# Patient Record
Sex: Female | Born: 1945 | Race: White | Hispanic: No | Smoking: Never smoker
Health system: Southern US, Community
[De-identification: ages and names within clinical notes are randomized; demographics above are authoritative.]

## PROBLEM LIST (undated history)

## (undated) DIAGNOSIS — N390 Urinary tract infection, site not specified: Secondary | ICD-10-CM

## (undated) DIAGNOSIS — M545 Low back pain, unspecified: Secondary | ICD-10-CM

## (undated) DIAGNOSIS — I10 Essential (primary) hypertension: Secondary | ICD-10-CM

## (undated) DIAGNOSIS — J329 Chronic sinusitis, unspecified: Secondary | ICD-10-CM

## (undated) DIAGNOSIS — M8580 Other specified disorders of bone density and structure, unspecified site: Secondary | ICD-10-CM

## (undated) HISTORY — DX: Chronic sinusitis, unspecified: J32.9

## (undated) HISTORY — DX: Low back pain: M54.5

## (undated) HISTORY — DX: Essential (primary) hypertension: I10

## (undated) HISTORY — PX: COLONOSCOPY: SHX174

## (undated) HISTORY — DX: Urinary tract infection, site not specified: N39.0

## (undated) HISTORY — PX: KNEE ARTHROSCOPY: SUR90

## (undated) HISTORY — PX: BUNIONECTOMY: SHX129

## (undated) HISTORY — DX: Low back pain, unspecified: M54.50

## (undated) HISTORY — DX: Other specified disorders of bone density and structure, unspecified site: M85.80

## (undated) HISTORY — PX: ROTATOR CUFF REPAIR: SHX139

---

## 2002-10-12 ENCOUNTER — Other Ambulatory Visit: Admission: RE | Admit: 2002-10-12 | Discharge: 2002-10-12 | Payer: Self-pay | Admitting: *Deleted

## 2002-12-15 ENCOUNTER — Ambulatory Visit (HOSPITAL_COMMUNITY): Admission: RE | Admit: 2002-12-15 | Discharge: 2002-12-15 | Payer: Self-pay | Admitting: Internal Medicine

## 2002-12-20 ENCOUNTER — Ambulatory Visit (HOSPITAL_COMMUNITY): Admission: RE | Admit: 2002-12-20 | Discharge: 2002-12-20 | Payer: Self-pay | Admitting: *Deleted

## 2003-10-12 ENCOUNTER — Other Ambulatory Visit: Admission: RE | Admit: 2003-10-12 | Discharge: 2003-10-12 | Payer: Self-pay | Admitting: *Deleted

## 2003-11-21 ENCOUNTER — Ambulatory Visit: Payer: Self-pay | Admitting: Internal Medicine

## 2003-12-13 ENCOUNTER — Emergency Department (HOSPITAL_COMMUNITY): Admission: EM | Admit: 2003-12-13 | Discharge: 2003-12-14 | Payer: Self-pay | Admitting: Emergency Medicine

## 2003-12-14 ENCOUNTER — Ambulatory Visit (HOSPITAL_COMMUNITY): Admission: RE | Admit: 2003-12-14 | Discharge: 2003-12-14 | Payer: Self-pay | Admitting: Internal Medicine

## 2003-12-18 ENCOUNTER — Ambulatory Visit: Payer: Self-pay | Admitting: Internal Medicine

## 2003-12-20 ENCOUNTER — Ambulatory Visit: Payer: Self-pay | Admitting: Internal Medicine

## 2004-01-01 ENCOUNTER — Ambulatory Visit (HOSPITAL_COMMUNITY): Admission: RE | Admit: 2004-01-01 | Discharge: 2004-01-01 | Payer: Self-pay | Admitting: *Deleted

## 2004-01-03 ENCOUNTER — Ambulatory Visit (HOSPITAL_COMMUNITY): Admission: RE | Admit: 2004-01-03 | Discharge: 2004-01-03 | Payer: Self-pay | Admitting: Internal Medicine

## 2004-01-23 ENCOUNTER — Ambulatory Visit: Payer: Self-pay | Admitting: Critical Care Medicine

## 2004-02-12 ENCOUNTER — Ambulatory Visit: Payer: Self-pay | Admitting: Critical Care Medicine

## 2004-04-15 ENCOUNTER — Ambulatory Visit: Payer: Self-pay | Admitting: Critical Care Medicine

## 2004-08-20 ENCOUNTER — Ambulatory Visit: Payer: Self-pay | Admitting: Critical Care Medicine

## 2004-11-12 ENCOUNTER — Ambulatory Visit: Payer: Self-pay | Admitting: Critical Care Medicine

## 2004-12-16 ENCOUNTER — Ambulatory Visit: Payer: Self-pay | Admitting: Pulmonary Disease

## 2005-04-17 ENCOUNTER — Ambulatory Visit: Payer: Self-pay | Admitting: Critical Care Medicine

## 2005-10-21 ENCOUNTER — Ambulatory Visit: Payer: Self-pay | Admitting: Internal Medicine

## 2005-10-23 ENCOUNTER — Ambulatory Visit: Payer: Self-pay | Admitting: Critical Care Medicine

## 2005-11-21 ENCOUNTER — Ambulatory Visit: Payer: Self-pay | Admitting: Internal Medicine

## 2006-02-09 ENCOUNTER — Ambulatory Visit: Payer: Self-pay | Admitting: Internal Medicine

## 2006-02-09 LAB — CONVERTED CEMR LAB
ALT: 15 units/L (ref 0–40)
AST: 19 units/L (ref 0–37)
Albumin: 3.9 g/dL (ref 3.5–5.2)
Alkaline Phosphatase: 49 units/L (ref 39–117)
BUN: 16 mg/dL (ref 6–23)
Basophils Absolute: 0.1 10*3/uL (ref 0.0–0.1)
Basophils Relative: 1.2 % — ABNORMAL HIGH (ref 0.0–1.0)
Bilirubin Urine: NEGATIVE
CO2: 30 meq/L (ref 19–32)
Calcium: 9.1 mg/dL (ref 8.4–10.5)
Chloride: 107 meq/L (ref 96–112)
Cholesterol: 219 mg/dL (ref 0–200)
Creatinine, Ser: 0.9 mg/dL (ref 0.4–1.2)
Direct LDL: 123.6 mg/dL
Eosinophils Absolute: 0.2 10*3/uL (ref 0.0–0.6)
Eosinophils Relative: 4.9 % (ref 0.0–5.0)
GFR calc Af Amer: 83 mL/min
GFR calc non Af Amer: 68 mL/min
Glucose, Bld: 101 mg/dL — ABNORMAL HIGH (ref 70–99)
HCT: 37.8 % (ref 36.0–46.0)
HDL: 68.2 mg/dL (ref >39.0)
Hemoglobin, Urine: NEGATIVE
Hemoglobin: 12.8 g/dL (ref 12.0–15.0)
Ketones, ur: NEGATIVE mg/dL
Leukocytes, UA: NEGATIVE
Lymphocytes Relative: 49.7 % — ABNORMAL HIGH (ref 12.0–46.0)
MCHC: 34 g/dL (ref 30.0–36.0)
MCV: 92.6 fL (ref 78.0–100.0)
Monocytes Absolute: 0.4 10*3/uL (ref 0.2–0.7)
Monocytes Relative: 9.5 % (ref 3.0–11.0)
Neutro Abs: 1.6 10*3/uL (ref 1.4–7.7)
Neutrophils Relative %: 34.7 % — ABNORMAL LOW (ref 43.0–77.0)
Nitrite: NEGATIVE
Platelets: 239 10*3/uL (ref 150–400)
Potassium: 4.1 meq/L (ref 3.5–5.1)
RBC: 4.08 M/uL (ref 3.87–5.11)
RDW: 12.3 % (ref 11.5–14.6)
Sodium: 142 meq/L (ref 135–145)
Specific Gravity, Urine: 1.015 (ref 1.000–1.03)
TSH: 2.33 microintl units/mL (ref 0.35–5.50)
Total Bilirubin: 0.6 mg/dL (ref 0.3–1.2)
Total CHOL/HDL Ratio: 3.2
Total Protein, Urine: NEGATIVE mg/dL
Total Protein: 6.8 g/dL (ref 6.0–8.3)
Triglycerides: 104 mg/dL (ref 0–149)
Urine Glucose: NEGATIVE mg/dL
Urobilinogen, UA: 0.2 (ref 0.0–1.0)
VLDL: 21 mg/dL (ref 0–40)
Vit D, 1,25-Dihydroxy: 32 (ref 20–57)
WBC: 4.6 10*3/uL (ref 4.5–10.5)
pH: 7.5 (ref 5.0–8.0)

## 2006-02-17 ENCOUNTER — Ambulatory Visit: Payer: Self-pay | Admitting: Internal Medicine

## 2006-02-25 DIAGNOSIS — N951 Menopausal and female climacteric states: Secondary | ICD-10-CM | POA: Insufficient documentation

## 2006-03-10 ENCOUNTER — Ambulatory Visit: Payer: Self-pay | Admitting: Critical Care Medicine

## 2006-04-02 DIAGNOSIS — N952 Postmenopausal atrophic vaginitis: Secondary | ICD-10-CM | POA: Insufficient documentation

## 2006-05-12 ENCOUNTER — Ambulatory Visit: Payer: Self-pay | Admitting: Pulmonary Disease

## 2006-09-22 ENCOUNTER — Ambulatory Visit: Payer: Self-pay | Admitting: Critical Care Medicine

## 2006-10-06 ENCOUNTER — Ambulatory Visit: Payer: Self-pay | Admitting: Critical Care Medicine

## 2006-12-15 ENCOUNTER — Ambulatory Visit: Payer: Self-pay | Admitting: Critical Care Medicine

## 2006-12-15 ENCOUNTER — Encounter: Payer: Self-pay | Admitting: Critical Care Medicine

## 2006-12-15 DIAGNOSIS — M899 Disorder of bone, unspecified: Secondary | ICD-10-CM

## 2006-12-15 DIAGNOSIS — M949 Disorder of cartilage, unspecified: Secondary | ICD-10-CM

## 2006-12-15 DIAGNOSIS — J309 Allergic rhinitis, unspecified: Secondary | ICD-10-CM

## 2006-12-15 DIAGNOSIS — M8580 Other specified disorders of bone density and structure, unspecified site: Secondary | ICD-10-CM | POA: Insufficient documentation

## 2006-12-15 DIAGNOSIS — J45909 Unspecified asthma, uncomplicated: Secondary | ICD-10-CM | POA: Insufficient documentation

## 2006-12-15 DIAGNOSIS — J329 Chronic sinusitis, unspecified: Secondary | ICD-10-CM | POA: Insufficient documentation

## 2006-12-15 HISTORY — DX: Chronic sinusitis, unspecified: J32.9

## 2006-12-15 HISTORY — DX: Disorder of bone, unspecified: M89.9

## 2006-12-15 HISTORY — DX: Unspecified asthma, uncomplicated: J45.909

## 2006-12-15 HISTORY — DX: Allergic rhinitis, unspecified: J30.9

## 2007-05-06 ENCOUNTER — Encounter: Payer: Self-pay | Admitting: Internal Medicine

## 2007-05-13 ENCOUNTER — Encounter: Payer: Self-pay | Admitting: Internal Medicine

## 2007-06-16 ENCOUNTER — Ambulatory Visit: Payer: Self-pay | Admitting: Critical Care Medicine

## 2007-06-23 ENCOUNTER — Telehealth (INDEPENDENT_AMBULATORY_CARE_PROVIDER_SITE_OTHER): Payer: Self-pay | Admitting: *Deleted

## 2007-06-28 ENCOUNTER — Encounter: Payer: Self-pay | Admitting: Internal Medicine

## 2007-08-16 ENCOUNTER — Encounter: Payer: Self-pay | Admitting: Internal Medicine

## 2007-08-30 ENCOUNTER — Emergency Department: Payer: Self-pay | Admitting: Emergency Medicine

## 2007-08-30 ENCOUNTER — Other Ambulatory Visit: Payer: Self-pay

## 2007-09-22 ENCOUNTER — Ambulatory Visit: Payer: Self-pay | Admitting: Critical Care Medicine

## 2007-09-22 ENCOUNTER — Telehealth: Payer: Self-pay | Admitting: Critical Care Medicine

## 2007-09-22 DIAGNOSIS — R071 Chest pain on breathing: Secondary | ICD-10-CM

## 2007-09-22 HISTORY — DX: Chest pain on breathing: R07.1

## 2007-09-29 ENCOUNTER — Encounter: Payer: Self-pay | Admitting: Critical Care Medicine

## 2007-10-14 ENCOUNTER — Ambulatory Visit: Payer: Self-pay | Admitting: Critical Care Medicine

## 2007-10-20 ENCOUNTER — Encounter: Payer: Self-pay | Admitting: Internal Medicine

## 2007-11-10 ENCOUNTER — Encounter: Payer: Self-pay | Admitting: Internal Medicine

## 2007-12-14 ENCOUNTER — Ambulatory Visit: Payer: Self-pay | Admitting: Critical Care Medicine

## 2007-12-16 ENCOUNTER — Telehealth: Payer: Self-pay | Admitting: Internal Medicine

## 2008-02-24 ENCOUNTER — Ambulatory Visit: Payer: Self-pay | Admitting: Internal Medicine

## 2008-02-24 ENCOUNTER — Encounter: Payer: Self-pay | Admitting: Internal Medicine

## 2008-03-19 ENCOUNTER — Telehealth: Payer: Self-pay | Admitting: Internal Medicine

## 2008-03-27 ENCOUNTER — Ambulatory Visit: Payer: Self-pay | Admitting: Internal Medicine

## 2008-03-27 DIAGNOSIS — R030 Elevated blood-pressure reading, without diagnosis of hypertension: Secondary | ICD-10-CM | POA: Insufficient documentation

## 2008-03-27 DIAGNOSIS — N3000 Acute cystitis without hematuria: Secondary | ICD-10-CM | POA: Insufficient documentation

## 2008-03-27 HISTORY — DX: Acute cystitis without hematuria: N30.00

## 2008-03-27 HISTORY — DX: Elevated blood-pressure reading, without diagnosis of hypertension: R03.0

## 2008-04-06 ENCOUNTER — Encounter: Admission: RE | Admit: 2008-04-06 | Discharge: 2008-04-06 | Payer: Self-pay | Admitting: Internal Medicine

## 2008-04-07 ENCOUNTER — Ambulatory Visit: Payer: Self-pay | Admitting: Critical Care Medicine

## 2008-04-10 ENCOUNTER — Telehealth: Payer: Self-pay | Admitting: Internal Medicine

## 2008-04-27 ENCOUNTER — Encounter: Payer: Self-pay | Admitting: Internal Medicine

## 2008-05-15 ENCOUNTER — Telehealth: Payer: Self-pay | Admitting: Internal Medicine

## 2008-05-15 DIAGNOSIS — N281 Cyst of kidney, acquired: Secondary | ICD-10-CM | POA: Insufficient documentation

## 2008-05-15 HISTORY — DX: Cyst of kidney, acquired: N28.1

## 2008-06-08 ENCOUNTER — Telehealth: Payer: Self-pay | Admitting: Internal Medicine

## 2008-06-13 ENCOUNTER — Ambulatory Visit: Payer: Self-pay | Admitting: Internal Medicine

## 2008-06-13 LAB — CONVERTED CEMR LAB
BUN: 22 mg/dL (ref 6–23)
CO2: 30 meq/L (ref 19–32)
Calcium: 9.4 mg/dL (ref 8.4–10.5)
Chloride: 105 meq/L (ref 96–112)
Creatinine, Ser: 0.8 mg/dL (ref 0.4–1.2)
GFR calc non Af Amer: 77.52 mL/min (ref >60)
Glucose, Bld: 105 mg/dL — ABNORMAL HIGH (ref 70–99)
Potassium: 4.5 meq/L (ref 3.5–5.1)
Sodium: 141 meq/L (ref 135–145)

## 2008-06-14 ENCOUNTER — Encounter: Admission: RE | Admit: 2008-06-14 | Discharge: 2008-06-14 | Payer: Self-pay | Admitting: Internal Medicine

## 2008-07-28 ENCOUNTER — Ambulatory Visit: Payer: Self-pay | Admitting: Internal Medicine

## 2008-07-28 DIAGNOSIS — T22019A Burn of unspecified degree of unspecified forearm, initial encounter: Secondary | ICD-10-CM

## 2008-07-28 DIAGNOSIS — T7840XA Allergy, unspecified, initial encounter: Secondary | ICD-10-CM | POA: Insufficient documentation

## 2008-07-28 HISTORY — DX: Burn of unspecified degree of unspecified forearm, initial encounter: T22.019A

## 2008-10-11 ENCOUNTER — Ambulatory Visit: Payer: Self-pay | Admitting: Critical Care Medicine

## 2008-12-18 ENCOUNTER — Ambulatory Visit: Payer: Self-pay | Admitting: Critical Care Medicine

## 2009-02-28 ENCOUNTER — Ambulatory Visit: Payer: Self-pay | Admitting: Critical Care Medicine

## 2009-05-09 ENCOUNTER — Telehealth (INDEPENDENT_AMBULATORY_CARE_PROVIDER_SITE_OTHER): Payer: Self-pay | Admitting: *Deleted

## 2009-06-13 ENCOUNTER — Encounter: Payer: Self-pay | Admitting: Internal Medicine

## 2009-08-23 ENCOUNTER — Ambulatory Visit: Payer: Self-pay | Admitting: Critical Care Medicine

## 2009-08-23 DIAGNOSIS — J018 Other acute sinusitis: Secondary | ICD-10-CM | POA: Insufficient documentation

## 2009-08-23 HISTORY — DX: Other acute sinusitis: J01.80

## 2009-10-15 ENCOUNTER — Ambulatory Visit: Payer: Self-pay | Admitting: Critical Care Medicine

## 2010-02-02 ENCOUNTER — Encounter: Payer: Self-pay | Admitting: Internal Medicine

## 2010-02-12 NOTE — Assessment & Plan Note (Signed)
Summary: flu shot/ mbw   Nurse Visit Flu Vaccine Consent Questions     Do you have a history of severe allergic reactions to this vaccine? no    Any prior history of allergic reactions to egg and/or gelatin? no    Do you have a sensitivity to the preservative Thimersol? no    Do you have a past history of Guillan-Barre Syndrome? no    Do you currently have an acute febrile illness? no    Have you ever had a severe reaction to latex? no    Vaccine information given and explained to patient? yes    Are you currently pregnant? no    Lot Number:[id number]   Exp Date:07/12/2009   Site Given  Left Deltoid IM  Boone Master CNA  October 11, 2008 5:07 PM   Allergies: 1)  ! * Proventil 2)  ! Codeine 3)  ! * Dilaudid  Orders Added: 1)  Admin 1st Vaccine [90471] 2)  Flu Vaccine 36yrs + [16109]

## 2010-02-12 NOTE — Progress Notes (Signed)
Summary: u/s  Phone Note Call from Patient Call back at Home Phone [phone] Call back at [phone]   Summary of Call: Pt had MRI and then u/s on kidney at [facility] about 6 mths ago. She wants dr to compare this to recent u/s.  Initial call taken by: Lamar Sprinkles,  April 10, 2008 2:36 PM  Follow-up for Phone Call        I can't compare studies. I can compare reports if she gets them to me. Follow-up by: Tresa Garter MD,  April 10, 2008 5:26 PM  Additional Follow-up for Phone Call Additional follow up Details #1::        Lf mess w/pt vm Additional Follow-up by: Lamar Sprinkles,  April 11, 2008 6:32 PM

## 2010-02-12 NOTE — Letter (Signed)
[facility]   Imported By: Sherian Rein 07/18/2009 11:55:03  _____________________________________________________________________  External Attachment:    Type:   Image     Comment:   External Document

## 2010-02-12 NOTE — Assessment & Plan Note (Signed)
Summary: Pulmonary OV   Vital Signs:  Patient Profile:   65 Years Old Female Height:     61 inches Weight:      132 pounds O2 Sat:      97 % O2 treatment:    Room Air Temp:     98.3 degrees F oral Pulse rate:   78 / minute BP sitting:   148 / 84  (left arm) Cuff size:   regular  Vitals Entered By: Cloyde Reams RN (October 14, 2007 10:41 AM)             Comments Pt is here today for a follow-up visit.  Pt had bronchitis 2 1/2-3weeks ago tx by PW.  Breathing is better now.  Requesting Flu shot today. Medications reviewed Cloyde Reams RN  October 14, 2007 10:50 AM      PCP:  Plotnikov  Chief Complaint:  Asthma  .  History of Present Illness: Pulmonary OV:  Mr. Ricco returns in follow-up, a 65 year old [place] female with moderate persistent asthma.  We switched her to Qvar from Asmanex as the Asmanex was causing hoarseness in the fall 08.  Last OV 6/09.     9/9  Worse over the past week.  PFR down to 225.  Cough is prod of thick yellow.  Hurts to take deep breath.  More pndrip.  Low grade fever. More fatigue.  Wheezing more.  Using rescue inhaler more. Pt denies any significant sore throat,  chills, sweats, unintended weight loss, pleurtic or exertional chest pain, orthopnea PND, or leg swelling  10/1:At last ov we rx: Prednisone 10mg  4 each am x3days, 3 x 3days, 2 x 3days, 1 x 3days then stop augmentin 875mg  twice daily increase qvar to three puffs twice a day for one week then two puffs twice a day CXR was clear.  Now is better.  Less cough. No chest pain.  No dyspnea.  No wheeze. Pt denies any significant sore throat, nasal congestion or excess secretions, fever, chills, sweats, unintended weight loss, pleurtic or exertional chest pain, orthopnea PND, or leg swelling Pt denies any increase in rescue therapy over baseline, denies waking up needing it or having any early am or nocturnal exacerbations of coughing/wheezing/or dyspnea.     Updated Prior Medication List:  MULTIVITAMINS  TABS (MULTIPLE VITAMIN) Take 1 tablet by mouth once a day ADULT ASPIRIN LOW STRENGTH 81 MG  TBDP (ASPIRIN) once daily CALCIUM 600/VITAMIN D 600-400 MG-UNIT TABS (CALCIUM CARBONATE-VITAMIN D) Take 1 tablet by mouth once a day QVAR 80 MCG/ACT  AERS (BECLOMETHASONE DIPROPIONATE) take 2 puffs two times a day MAXAIR AUTOHALER 200 MCG/INH  AERB (PIRBUTEROL ACETATE) as needed ALEVE 220 MG TABS (NAPROXEN SODIUM) as needed ZYRTEC ALLERGY 10 MG TABS (CETIRIZINE HCL) Take 1 tablet by mouth once a day as needed  Current Allergies (reviewed today): ! * PROVENTIL ! CODEINE ! * DILAUDID  Past Medical History:    Reviewed history from 09/22/2007 and no changes required:       OSTEOPENIA (ICD-733.90)       ALLERGIC RHINITIS (ICD-477.9)       ALLERGIC RHINITIS, SEASONAL (ICD-477.0)       ASTHMA (ICD-493.90)          -FeV1 106% 2006       SINUSITIS (ICD-473.9)                   Review of Systems      See HPI   Physical Exam  Gen: WD WN    WF      in NAD    NCAT Heent:  no jvd, no TMG, no cervical LNademopathy, orophyx clear,  nares with clear watery drainage. Cor: RRR nl s1/s2  no s3/s4  no m r h g Abd: soft NT BSA   no masses  No HSM  no rebound or guarding Ext perfused with no c v e v.d Neuro: intact, moves all 4s, CN II-XII intact, DTRs intact Chest: no wheezes, no rales or rhonchi Skin: clear  Genital/Rectal :deferred     Impression & Recommendations:  Problem # 1:  ASTHMA (ICD-493.90) Assessment: Improved Moderate persistent asthma improved plan: cont ICS  no further pred or ABX  Medications Added to Medication List This Visit: 1)  Multivitamins Tabs (Multiple vitamin) .... Take 1 tablet by mouth once a day 2)  Calcium 600/vitamin D 600-400 Mg-unit Tabs (Calcium carbonate-vitamin d) .... Take 1 tablet by mouth once a day 3)  Aleve 220 Mg Tabs (Naproxen sodium) .... As needed 4)  Zyrtec Allergy 10 Mg Tabs (Cetirizine hcl) .... Take 1 tablet by mouth once a  day as needed  Complete Medication List: 1)  Multivitamins Tabs (Multiple vitamin) .... Take 1 tablet by mouth once a day 2)  Adult Aspirin Low Strength 81 Mg Tbdp (Aspirin) .... Once daily 3)  Calcium 600/vitamin D 600-400 Mg-unit Tabs (Calcium carbonate-vitamin d) .... Take 1 tablet by mouth once a day 4)  Qvar 80 Mcg/act Aers (Beclomethasone dipropionate) .... Take 2 puffs two times a day 5)  Maxair Autohaler 200 Mcg/inh Aerb (Pirbuterol acetate) .... As needed 6)  Aleve 220 Mg Tabs (Naproxen sodium) .... As needed 7)  Zyrtec Allergy 10 Mg Tabs (Cetirizine hcl) .... Take 1 tablet by mouth once a day as needed   Patient Instructions: 1)  Return 2 months at [facility] office 2)  Flu vaccine today 3)  No change in medications  Flu Vaccine Consent Questions     Do you have a history of severe allergic reactions to this vaccine? no    Any prior history of allergic reactions to egg and/or gelatin? no    Do you have a sensitivity to the preservative Thimersol? no    Do you have a past history of Guillan-Barre Syndrome? no    Do you currently have an acute febrile illness? no    Have you ever had a severe reaction to latex? no    Vaccine information given and explained to patient? yes    Are you currently pregnant? no    Lot Number:[id number]   Site Given  Right Deltoid IM  Cloyde Reams RN  October 14, 2007 12:36 PM  ]

## 2010-02-12 NOTE — Progress Notes (Signed)
Summary: MRI  Phone Note Call from Patient   Caller: [phone] Summary of Call: Pt states she recieved a call from Dr Posey Rea about getting an MRI of her right kidney. She would like to have this scheduled. Pt aware, dr is out of the office. Initial call taken by: Lamar Sprinkles,  May 15, 2008 10:34 AM  Follow-up for Phone Call        Pls see 04/10/08 phone note. Follow-up by: Tresa Garter MD,  May 18, 2008 10:55 PM  Additional Follow-up for Phone Call Additional follow up Details #1::        Sorry, pt states that she recieved a call from dr plot about MRI. More records are on your desk Additional Follow-up by: Lamar Sprinkles,  May 19, 2008 8:45 AM  New Problems: RENAL CYST (ICD-593.2)   Additional Follow-up for Phone Call Additional follow up Details #2::    OK MRI Follow-up by: Tresa Garter MD,  May 29, 2008 7:48 AM  New Problems: RENAL CYST (ICD-593.2)

## 2010-02-12 NOTE — Assessment & Plan Note (Signed)
Summary: Pulmonary OV   Visit Type:  Acute visit Primary Provider/Referring Provider:  Plotnikov  CC:  Pt c/o sore throat starting 2 to 3 days ago worse last PM and and feeling lethargic.  History of Present Illness: Pulmonary OV:  65 yo WF with moderate persistent asthma    December 18, 2008 10:06 AM More difficulty 5 days ago.  Coughs and is more dyspnea and wheezing with chills.  Cough is dry.  Is having pn drip.  Was in [facility]giving [facility] and worse after this.  Throat is not sore .   No fever.   Notes chest pain if coughs.  February 28, 2009 3:13 PM Went to [place] in 12/10 coughed every day.  then cough better on 1/19 then yellow and grey mucous.  zpak and pred pulse started three days ago pred 40/d x 3 days and taper notes some sinus drip,  some headaches,  cough lingers,  No heartburn.   PFR dropped  August 23, 2009 11:10 AM The pt came back from [place] ill,  now with a  sore throat,  not able to swallow,  PFR dropping.   No fever.  Coughing is worse, clear,  no d/c from nose.  Heavy in chest .   Tired and exhausted.  Asthma History    Asthma Control Assessment:    Age range: 12+ years    Symptoms: throughout the day    Nighttime Awakenings: 0-2/month    Interferes w/ normal activity: some limitations    SABA use (not for EIB): >2 days/week    ATAQ questionnaire: 1-2    FEV1: 2.18 liters (today)    FEV1 Pred: 2.18 liters (today)    Exacerbations requiring oral systemic steroids: 0-1/year    Asthma Control Assessment: Very Poorly Controlled   Preventive Screening-Counseling & Management  Alcohol-Tobacco     Smoking Status: never  Current Medications (verified): 1)  Multivitamins  Tabs (Multiple Vitamin) .... Take 1 Tablet By Mouth Once A Day 2)  Adult Aspirin Low Strength 81 Mg  Tbdp (Aspirin) .... Once Daily 3)  Calcium 600/vitamin D 600-400 Mg-Unit Tabs (Calcium Carbonate-Vitamin D) .... Take 1 Tablet By Mouth Two Times A Day 4)  Qvar 80 Mcg/act   Aers (Beclomethasone Dipropionate) .... Take 2  Puffs Two Times A Day 5)  Maxair Autohaler 200 Mcg/inh  Aerb (Pirbuterol Acetate) .... As Needed 6)  Aleve 220 Mg Tabs (Naproxen Sodium) .... As Needed 7)  Zyrtec Allergy 10 Mg Tabs (Cetirizine Hcl) .... Take 1 Tablet By Mouth Once A Day As Needed 8)  Estrace 0.1 Mg/gm Crea (Estradiol) .... Twice A Week  Allergies (verified): 1)  ! * Proventil 2)  ! Codeine 3)  ! * Dilaudid  Past History:  Past medical, surgical, family and social histories (including risk factors) reviewed, and no changes noted (except as noted below).  Past Medical History: Reviewed history from 03/27/2008 and no changes required. OSTEOPENIA (ICD-733.90) ALLERGIC RHINITIS (ICD-477.9) ALLERGIC RHINITIS, SEASONAL (ICD-477.0) ASTHMA (ICD-493.90)    -FeV1 106% 2006 SINUSITIS (ICD-473.9) UTI 2010  Past Surgical History: Reviewed history from 12/15/2006 and no changes required. Bunion Surgery  Past Pulmonary History:  Pulmonary History: ASTHMA (ICD-493.90)    -FeV1 106% 2006 04/07/2008:  PFT's (Asthma ) Pre-Spirometry  FVC     Value: 2.98 L/min   Pred: 2.67 L/min     % Pred: 111 % FEV1     Value: 2.18 L     Pred: 2.18 L     %  Pred: 100 % FEV1/FVC   Value: 73 %     Pred: 89 %     FEF 25-75   Value: 1.64 L/min   Pred: 2.39 L/min     % Pred: 68 %  Family History: Reviewed history and no changes required.  Social History: Reviewed history from 07/28/2008 and no changes required. Patient never smoked.  Alcohol use-no Drug use-no Regular exercise-yes  Review of Systems       The patient complains of shortness of breath with activity, shortness of breath at rest, productive cough, non-productive cough, loss of appetite, difficulty swallowing, and sore throat.  The patient denies coughing up blood, chest pain, irregular heartbeats, acid heartburn, indigestion, weight change, abdominal pain, tooth/dental problems, headaches, nasal congestion/difficulty breathing  through nose, sneezing, itching, ear ache, anxiety, depression, hand/feet swelling, joint stiffness or pain, rash, change in color of mucus, and fever.    Vital Signs:  Patient profile:   65 year old female Height:      61 inches Weight:      124.31 pounds BMI:     23.57 O2 Sat:      97 % on Room air Temp:     98.4 degrees F oral Pulse rate:   78 / minute BP sitting:   130 / 82  (left arm)  Vitals Entered By: Zackery Barefoot CMA (August 23, 2009 11:00 AM)  O2 Flow:  Room air CC: Pt c/o sore throat starting 2 to 3 days ago worse last PM, and feeling lethargic Comments Medications reviewed with patient Verified contact number and pharmacy with patient Zackery Barefoot CMA  August 23, 2009 11:00 AM    Physical Exam  Additional Exam:  Gen: Pleasant, well nourished, in no distress ENT:L> R nares purulence Neck: No JVD, no TMG, no carotid bruits Lungs: no use of accessory muscles, no dullness to percussion, clear without rales or rhonchi; insp/exp wheeze Cardiovascular: RRR, heart sounds normal, no murmurs or gallops, no peripheral edema Musculoskeletal: No deformities, no cyanosis or clubbing     Pre-Spirometry FEV1    Value: 2.18 L     Pred: 2.18 L     Impression & Recommendations:  Problem # 1:  OTHER ACUTE SINUSITIS (ICD-461.8) Assessment Deteriorated acute sinusitis with asthma flare plan augmentin 875mg  two times a day x 7days depomedrol 120mg  IM increase qvar to 3puff two times a day then taper over one week  The following medications were removed from the medication list:    Zithromax Z-pak 250 Mg Tabs (Azithromycin) .Marland Kitchen... Take as directed Her updated medication list for this problem includes:    Amoxicillin-pot Clavulanate 875-125 Mg Tabs (Amoxicillin-pot clavulanate) .Marland Kitchen... Take one by mouth two times a day  Orders: Est. Patient Level IV (36644)  Medications Added to Medication List This Visit: 1)  Qvar 80 Mcg/act Aers (Beclomethasone dipropionate) .... 3  puffs twice daily for 7 days then reduce to 2 puff twice daily 2)  Zyrtec Allergy 10 Mg Tabs (Cetirizine hcl) .... Take 1 tablet by mouth once a day as needed hold for 10days then resume 3)  Amoxicillin-pot Clavulanate 875-125 Mg Tabs (Amoxicillin-pot clavulanate) .... Take one by mouth two times a day  Patient Instructions: 1)  You may take ibuprofen or aleve for pain in throat as needed 2)  Start augmentin one twice daily for 7days (sent to pharmacy) 3)  A Depomedrol 120mg  injection will be given 4)  Increase Qvar to 3 puffs twice daily for 7days then reduce to 2 puff  twice daily 5)  Hold zyrtec for 10days then may resume as needed 6)  Continue sinus rinse twice daily  7)  Return for recheck in 2-3 weeks [facility] office Prescriptions: AMOXICILLIN-POT CLAVULANATE 875-125 MG TABS (AMOXICILLIN-POT CLAVULANATE) Take one by mouth two times a day  #14 x 0   Entered and Authorized by:   Storm Frisk MD   Signed by:   Storm Frisk MD on 08/23/2009   Method used:   Electronically to        [facility] [facility] [address] [phone]* (retail)       [address]       [place], [place]  [address]       Ph: [phone] or [phone]       Fax: [phone]   RxID:   [id number]

## 2010-02-12 NOTE — Assessment & Plan Note (Signed)
Summary: Pulmonary OV   PCP:  Plotnikov  Chief Complaint:  1 month followup on asthma.  Pt denies any complaints today.[name]  History of Present Illness: Pulmonary OV:  65 yo WF with moderate persistent asthma  December 14, 2007 3:02 PM  Doing well,  no complaints.  Needs H1N1 vaccine. Pt denies any significant sore throat, nasal congestion or excess secretions, fever, chills, sweats, unintended weight loss, pleurtic or exertional chest pain, orthopnea PND, or leg swelling Pt denies any increase in rescue therapy over baseline, denies waking up needing it or having any early am or nocturnal exacerbations of coughing/wheezing/or dyspnea.       Current Allergies: ! * PROVENTIL ! CODEINE ! * DILAUDID  Past Medical History:    Reviewed history from 10/14/2007 and no changes required:       OSTEOPENIA (ICD-733.90)       ALLERGIC RHINITIS (ICD-477.9)       ALLERGIC RHINITIS, SEASONAL (ICD-477.0)       ASTHMA (ICD-493.90)          -FeV1 106% 2006       SINUSITIS (ICD-473.9)                   Review of Systems      See HPI   Vital Signs:  Patient Profile:   65 Years Old Female Height:     61 inches Weight:      136 pounds O2 Sat:      97 % O2 treatment:    Room Air Temp:     98.3 degrees F oral Pulse rate:   91 / minute BP sitting:   138 / 70  (left arm)  Vitals Entered By: Michel Bickers CMA (December 14, 2007 2:58 PM)                 Physical Exam  Gen: WD WN    WF      in NAD    NCAT Heent:  no jvd, no TMG, no cervical LNademopathy, orophyx clear,  nares with clear watery drainage. Cor: RRR nl s1/s2  no s3/s4  no m r h g Abd: soft NT BSA   no masses  No HSM  no rebound or guarding Ext perfused with no c v e v.d Neuro: intact, moves all 4s, CN II-XII intact, DTRs intact Chest: no wheezes, no rales or rhonchi Skin: clear  Genital/Rectal :deferred       Impression & Recommendations:  Problem # 1:  ASTHMA (ICD-493.90) Assessment: Improved moderate  persistent asthma stable at this time plan: no change in medications H1n1 vaccine today  Complete Medication List: 1)  Multivitamins Tabs (Multiple vitamin) .... Take 1 tablet by mouth once a day 2)  Adult Aspirin Low Strength 81 Mg Tbdp (Aspirin) .... Once daily 3)  Calcium 600/vitamin D 600-400 Mg-unit Tabs (Calcium carbonate-vitamin d) .... Take 1 tablet by mouth once a day 4)  Qvar 80 Mcg/act Aers (Beclomethasone dipropionate) .... Take 2 puffs two times a day 5)  Maxair Autohaler 200 Mcg/inh Aerb (Pirbuterol acetate) .... As needed 6)  Aleve 220 Mg Tabs (Naproxen sodium) .... As needed 7)  Zyrtec Allergy 10 Mg Tabs (Cetirizine hcl) .... Take 1 tablet by mouth once a day as needed 8)  Cipro 250 Mg Tabs (Ciprofloxacin hcl) .[name].. 1 by mouth 2 times daily   Patient Instructions: 1)  No change in medications 2)  H1N1 vaccine today 3)  Return three months Flu Vaccine Consent  Questions     Do you have a history of severe allergic reactions to this vaccine? no    Any prior history of allergic reactions to egg and/or gelatin? no    Do you have a sensitivity to the preservative Thimersol? no    Do you have a past history of Guillan-Barre Syndrome? no    Do you currently have an acute febrile illness? no    Have you ever had a severe reaction to latex? no    Vaccine information given and explained to patient? yes    Are you currently pregnant? no   Do you have Asthma? no   Lot Number: [id number]   Exp Date:07/12/2008   Site Given   Left Deltoid Only 0.28ml given to patient due to administration error. Dr. Delford Field did okay for patient to receive another 0.36ml, but patient declined for fear she would receive too much vaccine. Dr. Delford Field aware. Michel Bickers CMA  December 14, 2007 3:34 PM     ]

## 2010-02-12 NOTE — Assessment & Plan Note (Signed)
Summary: Pulmonary OV   Primary Provider/Referring Provider:  Plotnikov  CC:  fu visit ....no problems.  History of Present Illness: Pulmonary OV:  65 yo WF with moderate persistent asthma   April 07, 2008 4:20 PM No change in medications at last ov Pt noted a few weeks ago had cough in the am and through out the day with sl increase in [place] use.  Now is better with no pndrip.  No mucous production.  Gettting ready for a trip to [place] and wants zpak and pred pulse to take with her on trip. Pt denies any significant sore throat, nasal congestion or excess secretions, fever, chills, sweats, unintended weight loss, pleurtic or exertional chest pain, orthopnea PND, or leg swelling Pt denies any increase in rescue therapy over baseline, denies waking up needing it or having any early am or nocturnal exacerbations of coughing/wheezing/or dyspnea.    Current Medications (verified): 1)  Multivitamins  Tabs (Multiple Vitamin) .... Take 1 Tablet By Mouth Once A Day 2)  Adult Aspirin Low Strength 81 Mg  Tbdp (Aspirin) .... Once Daily 3)  Calcium 600/vitamin D 600-400 Mg-Unit Tabs (Calcium Carbonate-Vitamin D) .... Take 1 Tablet By Mouth Once A Day 4)  Qvar 80 Mcg/act  Aers (Beclomethasone Dipropionate) .... Take 2 Puffs Two Times A Day 5)  Maxair Autohaler 200 Mcg/inh  Aerb (Pirbuterol Acetate) .... As Needed 6)  Aleve 220 Mg Tabs (Naproxen Sodium) .... As Needed 7)  Zyrtec Allergy 10 Mg Tabs (Cetirizine Hcl) .... Take 1 Tablet By Mouth Once A Day As Needed 8)  Estrace 0.1 Mg/gm Crea (Estradiol) .... Twice A Week  Allergies (verified): 1)  ! * Proventil 2)  ! Codeine 3)  ! * Dilaudid  Past Pulmonary History:  Pulmonary History:    ASTHMA (ICD-493.90)       -FeV1 106% 2006    04/07/2008:  PFT's (Asthma )    Pre-Spirometry     FVC     Value: 2.98 L/min   Pred: 2.67 L/min     % Pred: 111 %    FEV1     Value: 2.18 L     Pred: 2.18 L     % Pred: 100 %    FEV1/FVC   Value: 73 %      Pred: 89 %     FEF 25-75   Value: 1.64 L/min   Pred: 2.39 L/min     % Pred: 68 %  Review of Systems  The patient denies shortness of breath with activity, shortness of breath at rest, productive cough, non-productive cough, coughing up blood, chest pain, irregular heartbeats, acid heartburn, indigestion, loss of appetite, weight change, abdominal pain, difficulty swallowing, sore throat, tooth/dental problems, headaches, nasal congestion/difficulty breathing through nose, sneezing, itching, ear ache, anxiety, depression, hand/feet swelling, joint stiffness or pain, rash, change in color of mucus, and fever.    Vital Signs:  Patient profile:   65 year old female Height:      61 inches Weight:      129.13 pounds O2 Sat:      95 % Temp:     98.5 degrees F oral Pulse rate:   74 / minute BP sitting:   120 / 68  (left arm) Cuff size:   regular  Vitals Entered By: Clarise Cruz Duncan Dull) (April 07, 2008 4:11 PM)  Physical Exam  Additional Exam:  Gen: Pleasant, well nourished, in no distress ENT: no lesions, no postnasal drip Neck: No JVD, no TMG, no  carotid bruits Lungs: no use of accessory muscles, no dullness to percussion, clear without rales or rhonchi;  prominent pseudowheeze Cardiovascular: RRR, heart sounds normal, no murmurs or gallops, no peripheral edema Musculoskeletal: No deformities, no cyanosis or clubbing     Pulmonary Function Test Date: 04/07/2008 Height (in.): 61 Gender: Female  Pre-Spirometry FVC    Value: 2.98 L/min   Pred: 2.67 L/min     % Pred: 111 % FEV1    Value: 2.18 L     Pred: 2.18 L     % Pred: 100 % FEV1/FVC  Value: 73 %     Pred: 89 %    FEF 25-75  Value: 1.64 L/min   Pred: 2.39 L/min     % Pred: 68 %  Comments: Normal spirometry, upper airway flutter c/w VCD  Impression & Recommendations:  Problem # 1:  ASTHMA (ICD-493.90) Assessment Unchanged Moderate persistent asthma stable at this time,  spirometry consistent with upper airway instability   plan: cont inhaled medications as prescribed zpak and pulse pred for [place] trip  Medications Added to Medication List This Visit: 1)  Estrace 0.1 Mg/gm Crea (Estradiol) .... Twice a week 2)  Zithromax Z-pak 250 Mg Tabs (Azithromycin) .... As directed 3)  Prednisone 10 Mg Tabs (Prednisone) .... Take as directed 4 each am x3days, 3 x 3days, 2 x 3days, 1 x 3days then stop  Complete Medication List: 1)  Multivitamins Tabs (Multiple vitamin) .... Take 1 tablet by mouth once a day 2)  Adult Aspirin Low Strength 81 Mg Tbdp (Aspirin) .... Once daily 3)  Calcium 600/vitamin D 600-400 Mg-unit Tabs (Calcium carbonate-vitamin d) .... Take 1 tablet by mouth once a day 4)  Qvar 80 Mcg/act Aers (Beclomethasone dipropionate) .... Take 2 puffs two times a day 5)  Maxair Autohaler 200 Mcg/inh Aerb (Pirbuterol acetate) .... As needed 6)  Aleve 220 Mg Tabs (Naproxen sodium) .... As needed 7)  Zyrtec Allergy 10 Mg Tabs (Cetirizine hcl) .... Take 1 tablet by mouth once a day as needed 8)  Estrace 0.1 Mg/gm Crea (Estradiol) .... Twice a week 9)  Zithromax Z-pak 250 Mg Tabs (Azithromycin) .... As directed 10)  Prednisone 10 Mg Tabs (Prednisone) .... Take as directed 4 each am x3days, 3 x 3days, 2 x 3days, 1 x 3days then stop  Other Orders: Est. Patient Level III ([address])  Patient Instructions: 1)  No change in medications 2)  Zithromax and prednisone prescribed for your trip. 3)  Refills on Qvar and Maxair were sent to pharmacy 4)  Return  three - four months   Prescriptions: MAXAIR AUTOHALER 200 MCG/INH  AERB (PIRBUTEROL ACETATE) as needed  #1 x 6   Entered and Authorized by:   Storm Frisk MD   Signed by:   Storm Frisk MD on 04/07/2008   Method used:   [facility]        [facility] [address] [phone]* (retail)       [address]       [place], [place]  [address]       Ph: [phone] or [phone]       Fax: [phone]   RxID:   [id number] QVAR 80  MCG/ACT  AERS (BECLOMETHASONE DIPROPIONATE) take 2 puffs two times a day  #1 x 6   Entered and Authorized by:   Storm Frisk MD   Signed by:   Storm Frisk MD on 04/07/2008  Method used:   Electronically to        [facility] [address] [phone]* (retail)       [address]       [place], [place]  [address]       Ph: [phone] or [phone]       Fax: [phone]   RxID:   [id number] PREDNISONE 10 MG  TABS (PREDNISONE) Take as directed 4 each am x3days, 3 x 3days, 2 x 3days, 1 x 3days then stop  #30 x 0   Entered and Authorized by:   Storm Frisk MD   Signed by:   Storm Frisk MD on 04/07/2008   Method used:   Electronically to        [facility] [address] [phone]* (retail)       [address]       [place], [place]  [address]       Ph: [phone] or [phone]       Fax: [phone]   RxID:   [id number] ZITHROMAX Z-PAK 250 MG  TABS (AZITHROMYCIN) As directed Brand medically necessary #1 x 0   Entered and Authorized by:   Storm Frisk MD   Signed by:   Storm Frisk MD on 04/07/2008   Method used:   Electronically to        [facility] [address] [phone]* (retail)       [address]       [place], [place]  [address]       Ph: [phone] or [phone]       Fax: [phone]   RxID:   [id number]

## 2010-02-12 NOTE — Assessment & Plan Note (Signed)
Summary: flu shot/jd   Nurse Visit   Allergies: 1)  ! * Proventil 2)  ! Codeine 3)  ! * Dilaudid  Orders Added: 1)  Admin 1st Vaccine [90471] 2)  Flu Vaccine 38yrs + [[address]] Flu Vaccine Consent Questions     Do you have a history of severe allergic reactions to this vaccine? no    Any prior history of allergic reactions to egg and/or gelatin? no    Do you have a sensitivity to the preservative Thimersol? no    Do you have a past history of Guillan-Barre Syndrome? no    Do you currently have an acute febrile illness? no    Have you ever had a severe reaction to latex? no    Vaccine information given and explained to patient? yes    Are you currently pregnant? no    Lot Number:[id number]   Exp Date:07/13/2010   Site Given  Left Deltoid IM  Clarise Cruz [facility])  October 15, 2009 3:26 PM

## 2010-02-12 NOTE — Progress Notes (Signed)
Summary: Labs-MRI  Phone Note Outgoing Call   Call placed by: Dagoberto Reef,  Jun 08, 2008 1:30 PM Summary of Call: Dr Posey Rea, this pt need bun and creat labs for MRI.   Thanks Initial call taken by: Dagoberto Reef,  Jun 08, 2008 1:31 PM  Follow-up for Phone Call        OK BMET 995.20 Follow-up by: Tresa Garter MD,  Jun 09, 2008 7:58 AM  Additional Follow-up for Phone Call Additional follow up Details #1::        Pt informed, has [facility]px lab in computer already, will do labs tuesday am Additional Follow-up by: Lamar Sprinkles,  Jun 09, 2008 4:50 PM

## 2010-02-12 NOTE — Letter (Signed)
Summary: April/09-Nov/09/[facility]  April/09-Nov/09/[facility]   Imported By: Sherian Rein 04/27/2008 08:52:16  _____________________________________________________________________  External Attachment:    Type:   Image     Comment:   External Document

## 2010-02-12 NOTE — Assessment & Plan Note (Signed)
Summary: Pulmonary OV   Visit Type:  Follow-up PCP:  Plotnikov  Chief Complaint:  ROV - going out of country.  History of Present Illness: [name] returns in follow-up, a 65 year old [place] female with moderate persistent asthma.  We switched her to Qvar from [place] the [place] was causing hoarseness in the fall 08.  Last seen 09/2006.  She is having  no issues at this time.  No cough.  No wheezing.  No need for as needed beta agonists.  No nocturnal shortness fo breath.   There is no chest tightness.        Current Allergies: ! * PROVENTIL ! CODEINE ! * DILAUDID  Past Medical History:    Reviewed history from 12/15/2006 and no changes required:       OSTEOPENIA (ICD-733.90)       ALLERGIC RHINITIS (ICD-477.9)       ALLERGIC RHINITIS, SEASONAL (ICD-477.0)       ASTHMA (ICD-493.90)       SINUSITIS (ICD-473.9)                     Current Meds:        * MULTI VITAMIN once daily       * VITAMIN 800MG  once daily       ADULT ASPIRIN LOW STRENGTH 81 MG  TBDP (ASPIRIN) once daily       * CALCIUM + D 600MG  once daily       QVAR 80 MCG/ACT  AERS (BECLOMETHASONE DIPROPIONATE) take 2 puffs two times a day       MAXAIR AUTOHALER 200 MCG/INH  AERB (PIRBUTEROL ACETATE) as needed       * ALEVE as needed       * ZYRTEC as needed              Allergies: * PROVENTIL, CODEINE, * DILAUDID.            Family History:    Reviewed history and no changes required:  Social History:    Reviewed history and no changes required:       Patient never smoked.    Risk Factors:  Tobacco use:  never   Review of Systems      See HPI   Vital Signs:  Patient Profile:   66 Years Old Female Height:     61 inches Weight:      134 pounds O2 Sat:      99 % O2 treatment:    Room Air Temp:     98.2 degrees F oral Pulse rate:   62 / minute BP sitting:   112 / 64  Vitals Entered By: [name] (June 16, 2007 10:23 AM)             Comments Medications reviewed with  patient [name]  June 16, 2007 10:30 AM      Physical Exam  Gen: WD WN    WF      in NAD    NCAT Heent:  no jvd, no TMG, no cervical LNademopathy, orophyx clear,  nares with clear watery drainage. Cor: RRR nl s1/s2  no s3/s4  no m r h g Abd: soft NT BSA   no masses  No HSM  no rebound or guarding Ext perfused with no c v e v.d Neuro: intact, moves all 4s, CN II-XII intact, DTRs intact Chest: distant BS  no wheezes, rales, rhonchi   no egophony  no consolidative breath  sounds, mild hyperresonance to percussion Skin: clear  Genital/Rectal :deferred      Impression & Recommendations:  Problem # 1:  ASTHMA (ICD-493.90) Assessment: Improved Mild intermittent asthma.  Stable at this time Plan : Cont Qvar two puffs twice daily Add veramyst daily Her updated medication list for this problem includes:    Qvar 80 Mcg/act Aers (Beclomethasone dipropionate) .Marland Kitchen... Take 2 puffs two times a day    Maxair Autohaler 200 Mcg/inh Aerb (Pirbuterol acetate) .Marland Kitchen... As needed  Orders: Est. Patient Level III (96045)   Medications Added to Medication List This Visit: 1)  Zithromax Z-pak 250 Mg Tabs (Azithromycin) .... Take as directed   Patient Instructions: 1)  No change in medications 2)  Try veramyst two sprays each nostril daily if needed for nasal congestion the saline rinse does not clear 3)  Please schedule a follow-up appointment in 4-5 months.   Prescriptions: ZITHROMAX Z-PAK 250 MG  TABS (AZITHROMYCIN) take as directed Brand medically necessary #10 x 0   Entered and Authorized by:   [name] Frisk MD   Signed by:   [name] Frisk MD on 06/16/2007   Method used:   Electronically sent to ...       [facility] [address] [phone]*       [address]       [place], [place]  [address]       Ph: [phone] or [phone]       Fax: [phone]   RxID:   [id number] MAXAIR AUTOHALER 200 MCG/INH  AERB (PIRBUTEROL ACETATE) as needed   #1 x 6   Entered and Authorized by:   [name] Frisk MD   Signed by:   [name] Frisk MD on 06/16/2007   Method used:   Electronically sent to ...       [facility]  [facility] [address] [phone]*       [address]       [place], [place]  [address]       Ph: [phone] or [phone]       Fax: [phone]   RxID:   [id number] QVAR 80 MCG/ACT  AERS (BECLOMETHASONE DIPROPIONATE) take 2 puffs two times a day  #1 x 6   Entered and Authorized by:   [name] Frisk MD   Signed by:   [name] Frisk MD on 06/16/2007   Method used:   Electronically sent to ...       [facility]  [facility] [address] [phone]*       [address]       [place], [place]  [address]       Ph: [phone] or [phone]       Fax: [phone]   RxID:   [id number]  ]

## 2010-02-12 NOTE — Progress Notes (Signed)
  Phone Note Call from Patient Call back at [phone]   Caller: Patient Summary of Call: has fairly typical urinary symptoms of urgency and burning In [place] visiting her daughter Explained that we do not phone in antibiotics suggested she get evaluated in an urgent care there Initial call taken by: Cindee Salt MD,  March 19, 2008 11:29 AM

## 2010-02-12 NOTE — Medication Information (Signed)
Summary: [facility] Augmentin[facility] [id number]  Rx for Augmentin/[facility] 3880   Imported By: Esmeralda Links D'jimraou 10/04/2007 12:00:54  _____________________________________________________________________  External Attachment:    Type:   Image     Comment:   External Document

## 2010-02-12 NOTE — Assessment & Plan Note (Signed)
Summary: Pulmonary OV   Primary Provider/Referring Provider:  Plotnikov  CC:  2 wk follow up.  c/o larnygitis, cough with yellow and gray mucus, fatigued, and increased SOB with activity and at rest x5days.  states peak flow meter readings have also dropped to the yellow zone.  started on azithromycin and pred taper on Monday.Marland Kitchen  History of Present Illness: Pulmonary OV:  65 yo WF with moderate persistent asthma    December 18, 2008 10:06 AM More difficulty 5 days ago.  Coughs and is more dyspnea and wheezing with chills.  Cough is dry.  Is having pn drip.  Was in thanksgiving with a dog at [facility] and worse after this.  Throat is not sore .   No fever.   Notes chest pain if coughs.  February 28, 2009 3:13 PM Went to [place] in 12/10 coughed every day.  then cough better on 1/19 then yellow and grey mucous.  zpak and pred pulse started three days ago pred 40/d x 3 days and taper notes some sinus drip,  some headaches,  cough lingers,  No heartburn.   PFR dropped   Asthma History    Asthma Control Assessment:    Age range: 12+ years    Symptoms: throughout the day    Nighttime Awakenings: 1-3/week    Interferes w/ normal activity: some limitations    SABA use (not for EIB): 0-2 days/week    ATAQ questionnaire: 1-2    FEV1: 2.18 liters (today)    FEV1 Pred: 2.18 liters (today)    Exacerbations requiring oral systemic steroids: 0-1/year    Asthma Control Assessment: Very Poorly Controlled   Preventive Screening-Counseling & Management  Alcohol-Tobacco     Smoking Status: never  Current Medications (verified): 1)  Multivitamins  Tabs (Multiple Vitamin) .... Take 1 Tablet By Mouth Once A Day 2)  Adult Aspirin Low Strength 81 Mg  Tbdp (Aspirin) .... Once Daily 3)  Calcium 600/vitamin D 600-400 Mg-Unit Tabs (Calcium Carbonate-Vitamin D) .... Take 1 Tablet By Mouth Two Times A Day 4)  Qvar 80 Mcg/act  Aers (Beclomethasone Dipropionate) .... Take 2 Puffs Two Times A Day 5)  Maxair  Autohaler 200 Mcg/inh  Aerb (Pirbuterol Acetate) .... As Needed 6)  Aleve 220 Mg Tabs (Naproxen Sodium) .... As Needed 7)  Zyrtec Allergy 10 Mg Tabs (Cetirizine Hcl) .... Take 1 Tablet By Mouth Once A Day As Needed 8)  Estrace 0.1 Mg/gm Crea (Estradiol) .... Twice A Week 9)  Prednisone 10 Mg Tabs (Prednisone) .... As Directed 10)  Zithromax Z-Pak 250 Mg Tabs (Azithromycin) .... As Directed  Allergies (verified): 1)  ! * Proventil 2)  ! Codeine 3)  ! * Dilaudid  Past History:  Past medical, surgical, family and social histories (including risk factors) reviewed, and no changes noted (except as noted below).  Past Medical History: Reviewed history from 03/27/2008 and no changes required. OSTEOPENIA (ICD-733.90) ALLERGIC RHINITIS (ICD-477.9) ALLERGIC RHINITIS, SEASONAL (ICD-477.0) ASTHMA (ICD-493.90)    -FeV1 106% 2006 SINUSITIS (ICD-473.9) UTI 2010  Past Surgical History: Reviewed history from 12/15/2006 and no changes required. Bunion Surgery  Past Pulmonary History:  Pulmonary History: ASTHMA (ICD-493.90)    -FeV1 106% 2006 04/07/2008:  PFT's (Asthma ) Pre-Spirometry  FVC     Value: 2.98 L/min   Pred: 2.67 L/min     % Pred: 111 % FEV1     Value: 2.18 L     Pred: 2.18 L     % Pred: 100 % FEV1/FVC  Value: 73 %     Pred: 89 %     FEF 25-75   Value: 1.64 L/min   Pred: 2.39 L/min     % Pred: 68 %  Family History: Reviewed history and no changes required.  Social History: Reviewed history from 07/28/2008 and no changes required. Patient never smoked.  Alcohol use-no Drug use-no Regular exercise-yes  Review of Systems       The patient complains of shortness of breath with activity, shortness of breath at rest, productive cough, non-productive cough, chest pain, headaches, nasal congestion/difficulty breathing through nose, sneezing, and change in color of mucus.  The patient denies coughing up blood, irregular heartbeats, acid heartburn, indigestion, loss of appetite,  weight change, abdominal pain, difficulty swallowing, sore throat, tooth/dental problems, itching, ear ache, anxiety, depression, hand/feet swelling, joint stiffness or pain, rash, and fever.    Vital Signs:  Patient profile:   65 year old female Height:      61 inches Weight:      129.38 pounds BMI:     24.53 O2 Sat:      97 % on Room air Temp:     97.5 degrees F oral Pulse rate:   86 / minute BP sitting:   160 / 86  (left arm) Cuff size:   regular  Vitals Entered By: Renold Genta RCP, LPN (February 28, 2009 2:57 PM)  O2 Flow:  Room air CC: 2 wk follow up.  c/o larnygitis, cough with yellow and gray mucus, fatigued, increased SOB with activity and at rest x5days.  states peak flow meter readings have also dropped to the yellow zone.  started on azithromycin and pred taper on Monday. Comments Medications reviewed with patient Daytime contact number verified with patient. Gweneth Dimitri RN  February 28, 2009 3:03 PM    Physical Exam  Additional Exam:  Gen: Pleasant, well nourished, in no distress ENT:L> R nares purulence Neck: No JVD, no TMG, no carotid bruits Lungs: no use of accessory muscles, no dullness to percussion, clear without rales or rhonchi; insp/exp wheeze Cardiovascular: RRR, heart sounds normal, no murmurs or gallops, no peripheral edema Musculoskeletal: No deformities, no cyanosis or clubbing     Pre-Spirometry FEV1    Value: 2.18 L     Pred: 2.18 L     Impression & Recommendations:  Problem # 1:  ASTHMA (ICD-493.90) Assessment Deteriorated moderate persistent asthma with flare due to ongoing rhinitis and early sinusitis plan Stop zithromax Augmentin one twice daily for 10days Prednisone 10mg  4 each am x 4 days, 3 x 4 days, 2 x 4 days, 1 x 4 days then stop (start pred pulse over) Mucinex DM two twice daily for 10days Increase qvar to 4 puff twice daily for 7 days then reduce to two puff twice daily Nasacort two puff daily until samples gone Use saline  nasal rinse twice daily  Return 3-4 weeks for recheck  Medications Added to Medication List This Visit: 1)  Qvar 80 Mcg/act Aers (Beclomethasone dipropionate) .... Take 4  puffs two times a day 2)  Prednisone 10 Mg Tabs (Prednisone) .... 4 each am x 4 days, 3 x 4 days, 2 x 4 days, 1 x 4 days then stop 3)  Prednisone 10 Mg Tabs (Prednisone) .... As directed 4)  Zithromax Z-pak 250 Mg Tabs (Azithromycin) .... As directed 5)  Amoxicillin-pot Clavulanate 875-125 Mg Tabs (Amoxicillin-pot clavulanate) .... Take one by mouth two times a day  generic please 6)  Nasacort Aq 55  Mcg/act Aers (Triamcinolone acetonide(nasal)) .... Two puffs each nostril daily  Complete Medication List: 1)  Multivitamins Tabs (Multiple vitamin) .... Take 1 tablet by mouth once a day 2)  Adult Aspirin Low Strength 81 Mg Tbdp (Aspirin) .... Once daily 3)  Calcium 600/vitamin D 600-400 Mg-unit Tabs (Calcium carbonate-vitamin d) .... Take 1 tablet by mouth two times a day 4)  Qvar 80 Mcg/act Aers (Beclomethasone dipropionate) .... Take 4  puffs two times a day 5)  Maxair Autohaler 200 Mcg/inh Aerb (Pirbuterol acetate) .... As needed 6)  Aleve 220 Mg Tabs (Naproxen sodium) .... As needed 7)  Zyrtec Allergy 10 Mg Tabs (Cetirizine hcl) .... Take 1 tablet by mouth once a day as needed 8)  Estrace 0.1 Mg/gm Crea (Estradiol) .... Twice a week 9)  Prednisone 10 Mg Tabs (Prednisone) .... 4 each am x 4 days, 3 x 4 days, 2 x 4 days, 1 x 4 days then stop 10)  Amoxicillin-pot Clavulanate 875-125 Mg Tabs (Amoxicillin-pot clavulanate) .... Take one by mouth two times a day  generic please 11)  Nasacort Aq 55 Mcg/act Aers (Triamcinolone acetonide(nasal)) .... Two puffs each nostril daily  Other Orders: Est. Patient Level IV (34742)  Patient Instructions: 1)  Stop zithromax 2)  Augmentin one twice daily for 10days 3)  Prednisone 10mg  4 each am x 4 days, 3 x 4 days, 2 x 4 days, 1 x 4 days then stop (start pred pulse over) 4)  Mucinex DM  two twice daily for 10days 5)  Increase qvar to 4 puff twice daily for 7 days then reduce to two puff twice daily 6)  Nasacort two puff daily until samples gone 7)  Use saline nasal rinse twice daily  8)  Return 3-4 weeks for recheck Prescriptions: PREDNISONE 10 MG TABS (PREDNISONE) 4 each am x 4 days, 3 x 4 days, 2 x 4 days, 1 x 4 days then stop  #40 x 0   Entered and Authorized by:   Storm Frisk MD   Signed by:   Storm Frisk MD on 02/28/2009   Method used:   Electronically to        [facility] [address] [phone]* (retail)       [address]       [place], [place]  [address]       Ph: [phone] or [phone]       Fax: [phone]   RxID:   [id number] AMOXICILLIN-POT CLAVULANATE 875-125 MG TABS (AMOXICILLIN-POT CLAVULANATE) Take one by mouth two times a day  generic please  #20 x 0   Entered and Authorized by:   Storm Frisk MD   Signed by:   Storm Frisk MD on 02/28/2009   Method used:   Electronically to        [facility]  [facility] [address] [phone]* (retail)       [address]       [place], [place]  [address]       Ph: [phone] or [phone]       Fax: [phone]   RxID:   [id number]

## 2010-02-12 NOTE — Assessment & Plan Note (Signed)
Summary: Pulmonary OV   PCP:  Plotnikov  Chief Complaint:  Pt c/o increased sob and cough with yellow mucus x1 day.Marland Kitchen  History of Present Illness: Pulmonary OV:  Mr. [name] returns in follow-up, a 65 year old [place] female with moderate persistent asthma.  We switched her to Qvar from Asmanex as the Asmanex was causing hoarseness in the fall 08.  Last OV 6/09.     9/9  Worse over the past week.  PFR down to 225.  Cough is prod of thick yellow.  Hurts to take deep breath.  More pndrip.  Low grade fever. More fatigue.  Wheezing more.  Using rescue inhaler more. Pt denies any significant sore throat,  chills, sweats, unintended weight loss, pleurtic or exertional chest pain, orthopnea PND, or leg swelling      Prior Medications Reviewed Using: Patient Recall  Updated Prior Medication List: * MULTI VITAMIN once daily ADULT ASPIRIN LOW STRENGTH 81 MG  TBDP (ASPIRIN) once daily * CALCIUM + D 600MG  once daily QVAR 80 MCG/ACT  AERS (BECLOMETHASONE DIPROPIONATE) take 2 puffs two times a day MAXAIR AUTOHALER 200 MCG/INH  AERB (PIRBUTEROL ACETATE) as needed * ALEVE as needed * ZYRTEC as needed  Current Allergies (reviewed today): ! * PROVENTIL ! CODEINE ! * DILAUDID  Past Medical History:    Reviewed history from 06/16/2007 and no changes required:       OSTEOPENIA (ICD-733.90)       ALLERGIC RHINITIS (ICD-477.9)       ALLERGIC RHINITIS, SEASONAL (ICD-477.0)       ASTHMA (ICD-493.90)       SINUSITIS (ICD-473.9)                   Review of Systems      See HPI   Vital Signs:  Patient Profile:   65 Years Old Female Height:     61 inches Weight:      130.2 pounds O2 Sat:      95 % O2 treatment:    Room Air Temp:     98.3 degrees F oral Pulse rate:   73 / minute BP sitting:   140 / 78  (left arm) Cuff size:   regular  Vitals Entered By: Michel Bickers CMA (September 22, 2007 10:07 AM)                 Physical Exam  Gen: WD WN    WF      in NAD    NCAT Heent:   no jvd, no TMG, no cervical LNademopathy, orophyx clear,  nares with clear watery drainage. Cor: RRR nl s1/s2  no s3/s4  no m r h g Abd: soft NT BSA   no masses  No HSM  no rebound or guarding Ext perfused with no c v e v.d Neuro: intact, moves all 4s, CN II-XII intact, DTRs intact Chest: expiratory  wheezes, no rales or rhonchi Skin: clear  Genital/Rectal :deferred    X-ray  Procedure date:  09/22/2007  Findings:      Exam Type: Chest Results: IMPRESSION: Negative for acute cardiopulmonary process.      Impression & Recommendations:  Problem # 1:  SINUSITIS (ICD-473.9) Assessment: Deteriorated recurrent maxillary sinusitis and acute bronchitis with allergic factors plan: augmentin 875 two times a day for 7days pred pulse for 12 days no other med changes except increase in Qvar for one week to three puff twice a day then two puff twice a day  Medications Added to  Medication List This Visit: 1)  Augmentin 875-125 Mg Tabs (Amoxicillin-pot clavulanate) .... By mouth twice daily 2)  Prednisone 10 Mg Tabs (Prednisone) .... Take as directed 4 each am x3days, 3 x 3days, 2 x 3days, 1 x 3days then stop  Complete Medication List: 1)  Multi Vitamin  .... Once daily 2)  Adult Aspirin Low Strength 81 Mg Tbdp (Aspirin) .... Once daily 3)  Calcium + D 600mg   .... Once daily 4)  Qvar 80 Mcg/act Aers (Beclomethasone dipropionate) .... Take 2 puffs two times a day 5)  Maxair Autohaler 200 Mcg/inh Aerb (Pirbuterol acetate) .... As needed 6)  Aleve  .... As needed 7)  Zyrtec  .... As needed 8)  Augmentin 875-125 Mg Tabs (Amoxicillin-pot clavulanate) .... By mouth twice daily 9)  Prednisone 10 Mg Tabs (Prednisone) .... Take as directed 4 each am x3days, 3 x 3days, 2 x 3days, 1 x 3days then stop   Patient Instructions: 1)  Prednisone 10mg  4 each am x3days, 3 x 3days, 2 x 3days, 1 x 3days then stop 2)  augmentin 875mg  twice daily 3)  increase qvar to three puffs twice a day for one  week then two puffs twice a day 4)  use ibuprofen or aleve for chest wall pain 5)  a chest xray will be obtained 6)  return two weeks   Prescriptions: PREDNISONE 10 MG  TABS (PREDNISONE) Take as directed 4 each am x3days, 3 x 3days, 2 x 3days, 1 x 3days then stop  #30 x 0   Entered and Authorized by:   Storm Frisk MD   Signed by:   Storm Frisk MD on 09/22/2007   Method used:   [facility]        [facility] [address] [phone]* (retail)       [address]       [place], [place]  [address]       Ph: [phone] or [phone]       Fax: [phone]   RxID:   [id number] AUGMENTIN 875-125 MG  TABS (AMOXICILLIN-POT CLAVULANATE) By mouth twice daily Brand medically necessary #14 x 0   Entered and Authorized by:   Storm Frisk MD   Signed by:   Storm Frisk MD on 09/22/2007   Method used:   Electronically to        [facility] [address] [phone]* (retail)       [address]       [place], [place]  [address]       Ph: [phone] or [phone]       Fax: [phone]   RxID:   [id number]  ]

## 2010-02-12 NOTE — Progress Notes (Signed)
Summary: UTI  Phone Note Call from Patient   Caller: [phone] Summary of Call: Patient is requesting rx for uti. C/o urinary frequency. 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 Initial call taken by: Lamar Sprinkles,  December 16, 2007 11:23 AM  Follow-up for Phone Call        OK Cipro. OV if sick. Follow-up by: Tresa Garter MD,  December 16, 2007 1:11 PM  Additional Follow-up for Phone Call Additional follow up Details #1::        Pt informed  Additional Follow-up by: Lamar Sprinkles,  December 16, 2007 1:19 PM    New/Updated Medications: CIPRO 250 MG TABS (CIPROFLOXACIN HCL) 1 by mouth 2 times daily   Prescriptions: CIPRO 250 MG TABS (CIPROFLOXACIN HCL) 1 by mouth 2 times daily  #10 x 0   Entered and Authorized by:   Tresa Garter MD   Signed by:   Lamar Sprinkles on 12/16/2007   Method used:   Electronically to        [facility] [address] [phone]* (retail)       [address]       [place], [place]  [address]       Ph: [phone] or [phone]       Fax: [phone]   RxID:   [id number]

## 2010-02-12 NOTE — Assessment & Plan Note (Signed)
Summary: Pulmonary OV   Primary Provider/Referring Provider:  Plotnikov  CC:  Acute Visit.  c/o dry cough, chills, chest tightness, fatigue, wheezing, and and inc SOB with activity and at rest x5days.Marland Kitchen  History of Present Illness: Pulmonary OV:  65 yo WF with moderate persistent asthma   April 07, 2008 4:20 PM No change in medications at last ov Pt noted a few weeks ago had cough in the am and through out the day with sl increase in Brimson use.  Now is better with no pndrip.  No mucous production.  Gettting ready for a trip to [place] and wants zpak and pred pulse to take with her on trip. Pt denies any significant sore throat, nasal congestion or excess secretions, fever, chills, sweats, unintended weight loss, pleurtic or exertional chest pain, orthopnea PND, or leg swelling Pt denies any increase in rescue therapy over baseline, denies waking up needing it or having any early am or nocturnal exacerbations of coughing/wheezing/or dyspnea.  December 18, 2008 10:06 AM More difficulty 5 days ago.  Coughs and is more dyspnea and wheezing with chills.  Cough is dry.  Is having pn drip.  Was in thanksgiving with a dog at [facility] and worse after this.  Throat is not sore .   No fever.   Notes chest pain if coughs.  Asthma History    Initial Asthma Severity Rating:    Age range: 12+ years    Symptoms: >2 days/week; not daily    Nighttime Awakenings: >1/week but not nightly    Interferes w/ normal activity: some limitations    SABA use (not for EIB): >2 days/week but not >1X/day    Exacerbations requiring oral systemic steroids: 0-1/year    Asthma Severity Assessment: Moderate Persistent   Preventive Screening-Counseling & Management  Alcohol-Tobacco     Smoking Status: never  Current Medications (verified): 1)  Multivitamins  Tabs (Multiple Vitamin) .... Take 1 Tablet By Mouth Once A Day 2)  Adult Aspirin Low Strength 81 Mg  Tbdp (Aspirin) .... Once Daily 3)  Calcium 600/vitamin D  600-400 Mg-Unit Tabs (Calcium Carbonate-Vitamin D) .... Take 1 Tablet By Mouth Two Times A Day 4)  Qvar 80 Mcg/act  Aers (Beclomethasone Dipropionate) .... Take 2 Puffs Two Times A Day 5)  Maxair Autohaler 200 Mcg/inh  Aerb (Pirbuterol Acetate) .... As Needed 6)  Aleve 220 Mg Tabs (Naproxen Sodium) .... As Needed 7)  Zyrtec Allergy 10 Mg Tabs (Cetirizine Hcl) .... Take 1 Tablet By Mouth Once A Day As Needed 8)  Estrace 0.1 Mg/gm Crea (Estradiol) .... Twice A Week  Allergies (verified): 1)  ! * Proventil 2)  ! Codeine 3)  ! * Dilaudid  Past History:  Past medical, surgical, family and social histories (including risk factors) reviewed, and no changes noted (except as noted below).  Past Medical History: Reviewed history from 03/27/2008 and no changes required. OSTEOPENIA (ICD-733.90) ALLERGIC RHINITIS (ICD-477.9) ALLERGIC RHINITIS, SEASONAL (ICD-477.0) ASTHMA (ICD-493.90)    -FeV1 106% 2006 SINUSITIS (ICD-473.9) UTI 2010  Past Surgical History: Reviewed history from 12/15/2006 and no changes required. Bunion Surgery  Past Pulmonary History:  Pulmonary History: ASTHMA (ICD-493.90)    -FeV1 106% 2006 04/07/2008:  PFT's (Asthma ) Pre-Spirometry  FVC     Value: 2.98 L/min   Pred: 2.67 L/min     % Pred: 111 % FEV1     Value: 2.18 L     Pred: 2.18 L     % Pred: 100 % FEV1/FVC  Value: 73 %     Pred: 89 %     FEF 25-75   Value: 1.64 L/min   Pred: 2.39 L/min     % Pred: 68 %  Family History: Reviewed history and no changes required.  Social History: Reviewed history from 07/28/2008 and no changes required. Patient never smoked.  Alcohol use-no Drug use-no Regular exercise-yes  Review of Systems       The patient complains of shortness of breath with activity, shortness of breath at rest, productive cough, and non-productive cough.  The patient denies coughing up blood, chest pain, irregular heartbeats, acid heartburn, indigestion, loss of appetite, weight change, abdominal  pain, difficulty swallowing, sore throat, tooth/dental problems, headaches, nasal congestion/difficulty breathing through nose, sneezing, itching, ear ache, anxiety, depression, hand/feet swelling, joint stiffness or pain, rash, change in color of mucus, and fever.    Vital Signs:  Patient profile:   65 year old female Height:      61 inches Weight:      131.25 pounds BMI:     24.89 O2 Sat:      97 % on Room air Temp:     98.1 degrees F oral Pulse rate:   79 / minute BP sitting:   130 / 86  (left arm) Cuff size:   regular  Vitals Entered By: Gweneth Dimitri RN (December 18, 2008 10:04 AM)  O2 Flow:  Room air CC: Acute Visit.  c/o dry cough, chills, chest tightness, fatigue, wheezing, and inc SOB with activity and at rest x5days. Comments Medications reviewed with patient Gweneth Dimitri RN  December 18, 2008 10:04 AM    Physical Exam  Additional Exam:  Gen: Pleasant, well nourished, in no distress ENT:L> R nares purulence Neck: No JVD, no TMG, no carotid bruits Lungs: no use of accessory muscles, no dullness to percussion, clear without rales or rhonchi; insp/exp wheeze Cardiovascular: RRR, heart sounds normal, no murmurs or gallops, no peripheral edema Musculoskeletal: No deformities, no cyanosis or clubbing     Impression & Recommendations:  Problem # 1:  SINUSITIS (ICD-473.9) Assessment Deteriorated  recurrent maxillary sinusitis and acute bronchitis with allergic factors  plan: Increase Qvar to three puffs twice daily for 10days then reduce to two puff twice daily Prednisone 10mg  4 each am x3days, 3 x 3days, 2 x 3days, 1 x 3days then stop Zpak for 5 days Veramyst two sprays each nostril daily until sample gone then stop Normal saline sinus spray two sprays each nostril twice daily (may obtain over the counter) Return 2-3 months or sooner if necessary Refill Zpak and prednisone for overseas trip  Problem # 2:  ASTHMA (ICD-493.90) Assessment: Deteriorated  Moderate  persistent asthma with flare due to acute sinusitis  plan: see assessment number one  Medications Added to Medication List This Visit: 1)  Zithromax Z-pak 250 Mg Tabs (Azithromycin) .... Take as directed: two first day then one a day until gone 2)  Prednisone 10 Mg Tabs (Prednisone) .... Take as directed 4 each am x3days, 3 x 3days, 2 x 3days, 1 x 3days then stop 3)  Veramyst 27.5 Mcg/spray Susp (Fluticasone furoate) .... Two puffs each nostril daily until sample gone then stop  Complete Medication List: 1)  Multivitamins Tabs (Multiple vitamin) .... Take 1 tablet by mouth once a day 2)  Adult Aspirin Low Strength 81 Mg Tbdp (Aspirin) .... Once daily 3)  Calcium 600/vitamin D 600-400 Mg-unit Tabs (Calcium carbonate-vitamin d) .... Take 1 tablet by mouth two  times a day 4)  Qvar 80 Mcg/act Aers (Beclomethasone dipropionate) .... Take 2 puffs two times a day 5)  Maxair Autohaler 200 Mcg/inh Aerb (Pirbuterol acetate) .... As needed 6)  Aleve 220 Mg Tabs (Naproxen sodium) .... As needed 7)  Zyrtec Allergy 10 Mg Tabs (Cetirizine hcl) .... Take 1 tablet by mouth once a day as needed 8)  Estrace 0.1 Mg/gm Crea (Estradiol) .... Twice a week 9)  Zithromax Z-pak 250 Mg Tabs (Azithromycin) .... Take as directed: two first day then one a day until gone 10)  Prednisone 10 Mg Tabs (Prednisone) .... Take as directed 4 each am x3days, 3 x 3days, 2 x 3days, 1 x 3days then stop 11)  Veramyst 27.5 Mcg/spray Susp (Fluticasone furoate) .... Two puffs each nostril daily until sample gone then stop  Other Orders: Est. Patient Level IV (83151)  Patient Instructions: 1)  Increase Qvar to three puffs twice daily for 10days then reduce to two puff twice daily 2)  Prednisone 10mg  4 each am x3days, 3 x 3days, 2 x 3days, 1 x 3days then stop 3)  Zpak for 5 days 4)  Veramyst two sprays each nostril daily until sample gone then stop 5)  Normal saline sinus spray two sprays each nostril twice daily (may obtain over the  counter) 6)  Return 2-3 months or sooner if necessary 7)  Refill Zpak and prednisone for [place] trip Prescriptions: PREDNISONE 10 MG  TABS (PREDNISONE) Take as directed 4 each am x3days, 3 x 3days, 2 x 3days, 1 x 3days then stop  #30 x 1   Entered and Authorized by:   Storm Frisk MD   Signed by:   Storm Frisk MD on 12/18/2008   Method used:   Electronically to        [facility] [address] [phone]* (retail)       [address]       [place], [place]  [address]       Ph: [phone] or [phone]       Fax: [phone]   RxID:   [id number] ZITHROMAX Z-PAK 250 MG TABS (AZITHROMYCIN) Take as directed: two first day then one a day until gone  #1 pak x 1   Entered and Authorized by:   Storm Frisk MD   Signed by:   Storm Frisk MD on 12/18/2008   Method used:   Electronically to        [facility] [address] [phone]* (retail)       [address]       [place], [place]  [address]       Ph: [phone] or [phone]       Fax: [phone]   RxID:   [id number]

## 2010-02-12 NOTE — Assessment & Plan Note (Signed)
Summary: FU   $50   STC   Vital Signs:  Patient profile:   65 year old female Height:      60 inches Weight:      130 pounds BMI:     25.48 Temp:     98.7 degrees F oral Pulse rate:   79 / minute BP sitting:   146 / 70  (left arm)  Vitals Entered By: Tora Perches (March 27, 2008 1:16 PM)  Primary Care Provider:  Tamra Koos   History of Present Illness: C/o 2 episodes of UTI since Dec 2009: bad w/incontinence and fever. Had BP 180 over 100 at [facility].  Problems Prior to Update: 1)  Chest Wall Pain, Anterior  (ICD-786.52) 2)  Osteopenia  (ICD-733.90) 3)  Allergic Rhinitis  (ICD-477.9) 4)  Allergic Rhinitis, Seasonal  (ICD-477.0) 5)  Asthma  (ICD-493.90) 6)  Sinusitis  (ICD-473.9)  Medications Prior to Update: 1)  Multivitamins  Tabs (Multiple Vitamin) .... Take 1 Tablet By Mouth Once A Day 2)  Adult Aspirin Low Strength 81 Mg  Tbdp (Aspirin) .... Once Daily 3)  Calcium 600/vitamin D 600-400 Mg-Unit Tabs (Calcium Carbonate-Vitamin D) .... Take 1 Tablet By Mouth Once A Day 4)  Qvar 80 Mcg/act  Aers (Beclomethasone Dipropionate) .... Take 2 Puffs Two Times A Day 5)  Maxair Autohaler 200 Mcg/inh  Aerb (Pirbuterol Acetate) .... As Needed 6)  Aleve 220 Mg Tabs (Naproxen Sodium) .... As Needed 7)  Zyrtec Allergy 10 Mg Tabs (Cetirizine Hcl) .... Take 1 Tablet By Mouth Once A Day As Needed  Current Medications (verified): 1)  Multivitamins  Tabs (Multiple Vitamin) .... Take 1 Tablet By Mouth Once A Day 2)  Adult Aspirin Low Strength 81 Mg  Tbdp (Aspirin) .... Once Daily 3)  Calcium 600/vitamin D 600-400 Mg-Unit Tabs (Calcium Carbonate-Vitamin D) .... Take 1 Tablet By Mouth Once A Day 4)  Qvar 80 Mcg/act  Aers (Beclomethasone Dipropionate) .... Take 2 Puffs Two Times A Day 5)  Maxair Autohaler 200 Mcg/inh  Aerb (Pirbuterol Acetate) .... As Needed 6)  Aleve 220 Mg Tabs (Naproxen Sodium) .... As Needed 7)  Zyrtec Allergy 10 Mg Tabs (Cetirizine Hcl) .... Take 1 Tablet By Mouth Once A Day As  Needed 8)  Estrace 0.1 Mg/gm Crea (Estradiol) .... Twice A Day  Allergies: 1)  ! * Proventil 2)  ! Codeine 3)  ! * Dilaudid  Past History:  Past Medical History:    OSTEOPENIA (ICD-733.90)    ALLERGIC RHINITIS (ICD-477.9)    ALLERGIC RHINITIS, SEASONAL (ICD-477.0)    ASTHMA (ICD-493.90)       -FeV1 106% 2006    SINUSITIS (ICD-473.9)    UTI 2010  Past Surgical History:    Reviewed history from 12/15/2006 and no changes required:    Bunion Surgery  Social History:    Patient never smoked.   Review of Systems       Not  sex active now  Physical Exam  General:  Well-developed,well-nourished,in no acute distress; alert,appropriate and cooperative throughout examination Nose:  External nasal examination shows no deformity or inflammation. Nasal mucosa are pink and moist without lesions or exudates. Mouth:  Oral mucosa and oropharynx without lesions or exudates.  Teeth in good repair. Lungs:  Normal respiratory effort, chest expands symmetrically. Lungs are clear to auscultation, no crackles or wheezes. Heart:  Normal rate and regular rhythm. S1 and S2 normal without gallop, murmur, click, rub or other extra sounds. Abdomen:  Bowel sounds positive,abdomen soft and non-tender  without masses, organomegaly or hernias noted. Msk:  No deformity or scoliosis noted of thoracic or lumbar spine.   Neurologic:  No cranial nerve deficits noted. Station and gait are normal. Plantar reflexes are down-going bilaterally. DTRs are symmetrical throughout. Sensory, motor and coordinative functions appear intact. Skin:  Intact without suspicious lesions or rashes   Impression & Recommendations:  Problem # 1:  ACUTE CYSTITIS (ICD-595.0)  Her updated medication list for this problem includes:    Cipro 250 Mg Tabs (Ciprofloxacin hcl) .Marland Kitchen... 1 by mouth 2 times daily  Orders: TLB-BMP (Basic Metabolic Panel-BMET) (80048-METABOL) TLB-CBC Platelet - w/Differential (85025-CBCD) TLB-TSH (Thyroid  Stimulating Hormone) (84443-TSH) TLB-Udip ONLY (81003-UDIP) T-Culture, Urine ([address]-60454) Radiology Referral (Radiology)  Problem # 2:  ASTHMA (ICD-493.90)  Her updated medication list for this problem includes:    Qvar 80 Mcg/act Aers (Beclomethasone dipropionate) .Marland Kitchen... Take 2 puffs two times a day    Maxair Autohaler 200 Mcg/inh Aerb (Pirbuterol acetate) .Marland Kitchen... As needed  Problem # 3:  ELEVATED BP (ICD-796.2) Assessment: Comment Only  Orders: TLB-BMP (Basic Metabolic Panel-BMET) (80048-METABOL) TLB-CBC Platelet - w/Differential (85025-CBCD) TLB-TSH (Thyroid Stimulating Hormone) (84443-TSH) TLB-Udip ONLY (81003-UDIP) T-Culture, Urine (09811-91478)  Her updated medication list for this problem includes:    Benicar 20 Mg Tabs (Olmesartan medoxomil) .Marland Kitchen... 1 tablet by mouth daily if BP >140/90  Complete Medication List: 1)  Multivitamins Tabs (Multiple vitamin) .... Take 1 tablet by mouth once a day 2)  Adult Aspirin Low Strength 81 Mg Tbdp (Aspirin) .... Once daily 3)  Calcium 600/vitamin D 600-400 Mg-unit Tabs (Calcium carbonate-vitamin d) .... Take 1 tablet by mouth once a day 4)  Qvar 80 Mcg/act Aers (Beclomethasone dipropionate) .... Take 2 puffs two times a day 5)  Maxair Autohaler 200 Mcg/inh Aerb (Pirbuterol acetate) .... As needed 6)  Aleve 220 Mg Tabs (Naproxen sodium) .... As needed 7)  Zyrtec Allergy 10 Mg Tabs (Cetirizine hcl) .... Take 1 tablet by mouth once a day as needed 8)  Estrace 0.1 Mg/gm Crea (Estradiol) .... Twice a day 9)  Cipro 250 Mg Tabs (Ciprofloxacin hcl) .Marland Kitchen.. 1 by mouth 2 times daily 10)  Benicar 20 Mg Tabs (Olmesartan medoxomil) .Marland Kitchen.. 1 tablet by mouth daily  Patient Instructions: 1)  Call if you are not better in a reasonable ammount of time or if worse.  2)  Align x 7 d 3)  Please schedule a follow-up appointment in 3 months. 4)  BMP prior to visit, ICD-9: 5)  Urine-dip prior to visit, ICD-9: Prescriptions: BENICAR 20 MG TABS (OLMESARTAN  MEDOXOMIL) 1 tablet by mouth daily  #30 x 12   Entered and Authorized by:   Tresa Garter MD   Signed by:   Tresa Garter MD on 03/27/2008   Method used:   Print then Give to Patient   RxID:   [id number] CIPRO 250 MG TABS (CIPROFLOXACIN HCL) 1 by mouth 2 times daily  #20 x 1   Entered and Authorized by:   Tresa Garter MD   Signed by:   Tresa Garter MD on 03/27/2008   Method used:   Print then Give to Patient   RxID:   [id number]

## 2010-02-12 NOTE — Progress Notes (Signed)
Summary: prescript  Phone Note Call from Patient Call back at [phone]   Caller: Patient Call For: wright Summary of Call: pt leaving for [place] next week would like prescript for prednisone and z pak pls call pt when done [facility] Initial call taken by: Rickard Patience,  May 09, 2009 11:26 AM  Follow-up for Phone Call        Spoke with pt.  Pt states she is leaving for [place] next wk and requesting rx for prednisone and z pak to be sent [facility] to take with her to [place].  Informed her PW out of office until tomorrow morning - she is ok with this.  Will forwward message to PW - pls advise if you are ok with this.  Thanks!  Follow-up by: Gweneth Dimitri RN,  May 09, 2009 11:42 AM  Additional Follow-up for Phone Call Additional follow up Details #1::        ok to call in: Zpak prednisone 10mg  Take 4 daily for two days, then 3 daily for two days, then two daily for two days then one daily for two days then stop #20 Additional Follow-up by: Storm Frisk MD,  May 09, 2009 11:47 AM    Additional Follow-up for Phone Call Additional follow up Details #2::    Spoke with pt.  Pt aware rxs ok with PW and aware [facility]  Gweneth Dimitri RN  May 09, 2009 12:13 PM   New/Updated Medications: ZITHROMAX Z-PAK 250 MG TABS (AZITHROMYCIN) take as directed PREDNISONE 10 MG TABS (PREDNISONE) Take 4 daily for two days, then 3 daily for two days, then two daily for two days then one daily for two days then stop Prescriptions: PREDNISONE 10 MG TABS (PREDNISONE) Take 4 daily for two days, then 3 daily for two days, then two daily for two days then one daily for two days then stop  #20 x 0   Entered by:   Gweneth Dimitri RN   Authorized by:   Storm Frisk MD   Signed by:   Gweneth Dimitri RN on 05/09/2009   Method used:   Electronically to        [facility] [address] [phone]* (retail)       [address]       [place], [place]   [address]       Ph: [phone] or [phone]       Fax: [phone]   RxID:   [id number] ZITHROMAX Z-PAK 250 MG TABS (AZITHROMYCIN) take as directed  #1 x 0   Entered by:   Gweneth Dimitri RN   Authorized by:   Storm Frisk MD   Signed by:   Gweneth Dimitri RN on 05/09/2009   Method used:   Electronically to        [facility] [address] [phone]* (retail)       [address]       [place], [place]  [address]       Ph: [phone] or [phone]       Fax: [phone]   RxID:   [id number]

## 2010-02-12 NOTE — Assessment & Plan Note (Signed)
Summary: BEE STING ON FACE/NWS $50   Vital Signs:  Patient profile:   65 year old female Height:      61 inches Weight:      131 pounds O2 Sat:      94 % on Room air Temp:     98.6 degrees F oral Pulse rate:   76 / minute Pulse rhythm:   regular BP sitting:   148 / 90  (left arm) Cuff size:   regular  Vitals Entered By: Rock Nephew CMA (July 28, 2008 11:14 AM)  O2 Flow:  Room air  Primary Care Provider:  Plotnikov   History of Present Illness: New to me c/o bee sting on left face yesterday at 5p and burn on right forearm later the same day.  Preventive Screening-Counseling & Management  Caffeine-Diet-Exercise     Does Patient Exercise: yes      Drug Use:  no.    Current Medications (verified): 1)  Multivitamins  Tabs (Multiple Vitamin) .... Take 1 Tablet By Mouth Once A Day 2)  Adult Aspirin Low Strength 81 Mg  Tbdp (Aspirin) .... Once Daily 3)  Calcium 600/vitamin D 600-400 Mg-Unit Tabs (Calcium Carbonate-Vitamin D) .... Take 1 Tablet By Mouth Two Times A Day 4)  Qvar 80 Mcg/act  Aers (Beclomethasone Dipropionate) .... Take 2 Puffs Two Times A Day 5)  Maxair Autohaler 200 Mcg/inh  Aerb (Pirbuterol Acetate) .... As Needed 6)  Aleve 220 Mg Tabs (Naproxen Sodium) .... As Needed 7)  Zyrtec Allergy 10 Mg Tabs (Cetirizine Hcl) .... Take 1 Tablet By Mouth Once A Day As Needed 8)  Estrace 0.1 Mg/gm Crea (Estradiol) .... Twice A Week  Allergies (verified): 1)  ! * Proventil 2)  ! Codeine 3)  ! * Dilaudid  Past History:  Past Medical History: Reviewed history from 03/27/2008 and no changes required. OSTEOPENIA (ICD-733.90) ALLERGIC RHINITIS (ICD-477.9) ALLERGIC RHINITIS, SEASONAL (ICD-477.0) ASTHMA (ICD-493.90)    -FeV1 106% 2006 SINUSITIS (ICD-473.9) UTI 2010  Past Surgical History: Reviewed history from 12/15/2006 and no changes required. Bunion Surgery  Social History: Patient never smoked.  Alcohol use-no Drug use-no Regular exercise-yes Drug Use:  no  Does Patient Exercise:  yes  Review of Systems  The patient denies anorexia, fever, chest pain, syncope, prolonged cough, hemoptysis, abdominal pain, difficulty walking, and enlarged lymph nodes.   Derm:  Complains of insect bite(s), itching, and rash; denies dryness, flushing, lesion(s), and poor wound healing.  Physical Exam  General:  alert, well-developed, well-nourished, well-hydrated, normal appearance, healthy-appearing, cooperative to examination, and good hygiene.   Head:  left face, malar surface shows swelling that measures 1.5 cm in diameter. Eyes:  No corneal or conjunctival inflammation noted. EOMI. Perrla. Funduscopic exam benign, without hemorrhages, exudates or papilledema. Vision grossly normal. Mouth:  Oral mucosa and oropharynx without lesions or exudates.  Teeth in good repair. Neck:  No deformities, masses, or tenderness noted. Lungs:  Normal respiratory effort, chest expands symmetrically. Lungs are clear to auscultation, no crackles or wheezes. Heart:  Normal rate and regular rhythm. S1 and S2 normal without gallop, murmur, click, rub or other extra sounds. Abdomen:  Bowel sounds positive,abdomen soft and non-tender without masses, organomegaly or hernias noted. Skin:  erythema c/w 1 degree burn on dorsum of right forearm   Impression & Recommendations:  Problem # 1:  ALLERGIC REACTION, ACUTE (ICD-995.3) Assessment New treat topically and systemically.  Problem # 2:  BURN OF UNSPECIFIED DEGREE OF FOREARM (ICD-943.01) Assessment: New apply silvadene cream and  cold compresses.  Problem # 3:  ELEVATED BP (ICD-796.2) Assessment: Unchanged  Complete Medication List: 1)  Multivitamins Tabs (Multiple vitamin) .... Take 1 tablet by mouth once a day 2)  Adult Aspirin Low Strength 81 Mg Tbdp (Aspirin) .... Once daily 3)  Calcium 600/vitamin D 600-400 Mg-unit Tabs (Calcium carbonate-vitamin d) .... Take 1 tablet by mouth two times a day 4)  Qvar 80 Mcg/act Aers  (Beclomethasone dipropionate) .... Take 2 puffs two times a day 5)  Maxair Autohaler 200 Mcg/inh Aerb (Pirbuterol acetate) .... As needed 6)  Aleve 220 Mg Tabs (Naproxen sodium) .... As needed 7)  Zyrtec Allergy 10 Mg Tabs (Cetirizine hcl) .... Take 1 tablet by mouth once a day as needed 8)  Estrace 0.1 Mg/gm Crea (Estradiol) .... Twice a week 9)  Benadryl 25 Mg Caps (Diphenhydramine hcl) .Marland Kitchen.. 1-2 by mouth qid as needed facial swelling 10)  Silvadene 1 % Crea (Silver sulfadiazine) .... Apply to burn on right forearm once daily for 7 days 11)  Triamcinolone Acetonide 0.1 % Crea (Triamcinolone acetonide) .... Apply to beesting on left face two times a day for 5 days  Patient Instructions: 1)  Please schedule a follow-up appointment in 2 weeks. Apply cold compresses to burn on forearm and beesting on left face. 2)  Check your Blood Pressure regularly. If it is above 140/90: you should make an appointment. Prescriptions: TRIAMCINOLONE ACETONIDE 0.1 % CREA (TRIAMCINOLONE ACETONIDE) Apply to beesting on left face two times a day for 5 days  #30 gms x 0   Entered and Authorized by:   Etta Grandchild MD   Signed by:   Etta Grandchild MD on 07/28/2008   Method used:   Print then Give to Patient   RxID:   [id number] SILVADENE 1 % CREA (SILVER SULFADIAZINE) Apply to burn on right forearm once daily for 7 days  #1 tube x 0   Entered and Authorized by:   Etta Grandchild MD   Signed by:   Etta Grandchild MD on 07/28/2008   Method used:   Print then Give to Patient   RxID:   [id number] BENADRYL 25 MG CAPS (DIPHENHYDRAMINE HCL) 1-2 by mouth QID as needed facial swelling  #50 x 0   Entered and Authorized by:   Etta Grandchild MD   Signed by:   Etta Grandchild MD on 07/28/2008   Method used:   Print then Give to Patient   RxID:   [id number]    Medication Administration  Injection # 1:    Medication: Depo- Medrol 80mg     Route: IM    Site: LUOQ gluteus    Exp Date: 08-2010    Lot  #: Kartier.Seltzer    Mfr: Pharmacia    Patient tolerated injection without complications    Given by: Beola Cord, CMA (July 28, 2008 11:35 AM)  Orders Added: 1)  Est. Patient Level IV [40347]

## 2010-02-19 DIAGNOSIS — N393 Stress incontinence (female) (male): Secondary | ICD-10-CM | POA: Insufficient documentation

## 2010-02-19 DIAGNOSIS — N8189 Other female genital prolapse: Secondary | ICD-10-CM | POA: Insufficient documentation

## 2010-05-28 NOTE — Assessment & Plan Note (Signed)
[facility]                             PULMONARY OFFICE NOTE   NAME:[name], [name] BIELICKI                        MRN:          [id number]  DATE:10/06/2006                            DOB:          01/16/1945    [name] returns in follow-up, a 65 year old [place] female with  moderate persistent asthma.  We switched her to Qvar from Asmanex as the  Asmanex was causing hoarseness.  She states her breathing is worse on  the Qvar.  She is having more breathlessness but the hoarseness is gone.   EXAM:  Temperature 98, blood pressure 128/80, pulse 71, saturation 97%  on room air.  CHEST:  Completely clear today without evidence of wheeze or rhonchi.  CARDIAC:  A regular rate and rhythm without S3, normal S1-S2.  ABDOMEN:  Soft, nontender.  EXTREMITIES:  No edema, clubbing or venous disease.  SKIN:  Clear.  NEUROLOGIC:  Intact.  HEENT:  No jugular venous distention or lymphadenopathy.  Oropharynx  clear.  NECK:  Supple.   IMPRESSION:  Moderate persistent asthma.  While her hoarseness is better  on the Qvar, her airway control is worse.  We are going to switch her  back to the Asmanex at one spray b.i.d.  She is going to pursue oral  hygiene and call back with a report on this in 2 weeks.     Charlcie Cradle Delford Field, MD, [facility]  Electronically Signed    PEW/MedQ  DD: 10/06/2006  DT: 10/07/2006  Job #: [id number]   cc:   Georgina Quint. Plotnikov, MD

## 2010-05-28 NOTE — Assessment & Plan Note (Signed)
[facility]                             PULMONARY OFFICE NOTE   NAME:[name], [name]                        MRN:          [id number]  DATE:09/22/2006                            DOB:          03-16-1945    SUBJECTIVE:  Ms. Fellman is a 65 year old white female with a history of  moderate persistent asthma, allergic rhinitis and elevated IgE levels.  The patient was in [place] on a trip this spring and noted increased  shortness of breath and wheezing, which required a nebulizer treatment.  Since returning to [place] is still having difficulty with  breathing and notes hoarseness on the Asmanex, which she is using one  spray daily.   PHYSICAL EXAMINATION:  VITAL SIGNS:  Temperature 98 degrees, blood  pressure 148/70, pulse 77, saturation 96% on room air.  CHEST:  Distant breath sounds with prolonged expiratory phase.  No  wheeze or rhonchi noted.  There was, however, an expiratory wheeze with  forced exhalation.  HEART:  A regular rate and rhythm without S3.  Normal S1 and S2.  ABDOMEN:  Soft, nontender.  EXTREMITIES:  No edema or clubbing.  SKIN:  Clear.  NEUROLOGIC:  Intact.  HEENT/NECK:  No jugular venous distention, no lymphadenopathy.  Oropharynx clear.  The neck is supple.   IMPRESSION:  Moderate persistent asthma with significant atopic  features.   PLAN:  To begin Q-Var 80 mcg strength, two sprays twice daily.  The  Asmanex will be discontinued.  As Aerochamber was provided.  We will see  the patient in return followup in one month.     Charlcie Cradle Delford Field, MD, [facility]  Electronically Signed    PEW/MedQ  DD: 09/22/2006  DT: 09/23/2006  Job #: [id number]   cc:   Georgina Quint. Plotnikov, MD

## 2010-05-28 NOTE — Assessment & Plan Note (Signed)
[facility]                             PULMONARY OFFICE NOTE   NAME:[name] [name] CLINKSCALE                        MRN:          [id number]  DATE:12/15/2006                            DOB:          04-16-1945    HISTORY OF PRESENT ILLNESS:  The patient is a 65 year old white female  patient of Dr. Lynelle [name] who has a known history of moderate persistent  asthma who presents today for an acute office visit.  The patient  complains over the last week she has had nasal congestion, productive  cough, and wheezing.  The patient denies any hemoptysis, orthopnea,  recent travel, or antibiotic use.   PAST MEDICAL HISTORY:  Reviewed.   CURRENT MEDICATIONS:  Reviewed.   PHYSICAL EXAM:  Afebrile with stable vital signs.  O2 saturation is 97%  on room air.  HEENT:  Nasal mucosa with some mild erythema.  Nontender sinuses.  Posterior oropharynx is clear.  NECK:  Supple without cervical adenopathy.  No JVD.  LUNGS:  Reveal coarse breath sounds bilaterally with a few expiratory  wheezes.  CARDIAC:  A regular rate and rhythm.  ABDOMEN:  Soft and nontender.  EXTREMITIES:  Warm without any edema.   IMPRESSION AND PLAN:  Acute asthmatic bronchitic exacerbation.  The  patient to begin Z-Pak x1.  Mucinex DM twice daily.  Saline nasal spray  p.r.n.  The patient is to begin prednisone taper over the next week.  The patient was given Xopenex nebulizer treatment today in the office.  The patient will follow up with Dr. Delford [name] in 4 to 6 weeks or sooner if  needed.      [name] Oaks, NP  Electronically Signed      [name] Cradle [name] Field, MD, [facility]  Electronically Signed   TP/MedQ  DD: 12/15/2006  DT: 12/15/2006  Job #: [id number]

## 2010-05-31 NOTE — Letter (Signed)
November 10, 2005    Jatia Musa  [address]  [place], [place] [address]   RE:  DRINDA, BELGARD  MRN:  [id number]  /  DOB:  [birth year]   Dear Ms. Cataldi,   This is regarding your bone DEXA scan which showed a slight reduction in  bone density compared to 2005 values. Your left hip number went from 0.94 to  0.871, your left femoral neck number went from 0.69 to 0.643 and your spine  number went from 0.877 to 0.876. These are very slight changes but do  suggest you would be benefitted by adding vitamin D 400 units to your  program of calcium supplementation. I would stay on your Asmanex as  prescribed and I will show these results to Dr. Posey Rea.    Sincerely,      Charlcie Cradle. Delford Field, MD, [facility]    PEW/MedQ  DD: 11/10/2005  DT: 11/11/2005  Job #: [id number]

## 2010-05-31 NOTE — Assessment & Plan Note (Signed)
[facility]                             PULMONARY OFFICE NOTE   NAME:[name], [name]                        MRN:          [id number]  DATE:03/10/2006                            DOB:          1945-12-16    [name] is a 65 year old white female, history of moderate persistent  asthma, allergic rhinitis, elevated levels of IgE but the patient  declines Xolair therapy.  She is maintained on:  1. Asmanex 1 spray daily.  2. Maxair p.r.n.   She developed upper respiratory tract-type symptoms with flu-like  illness 3 days ago, noting now increased wheezing, especially at night,  increase hoarseness and fatigue.   PHYSICAL EXAMINATION:  Temp 98, blood pressure 140/72, pulse 77,  saturation 98% on room air.  CHEST:  Showed distant breath sounds, few expiratory squeaks, fair  airflow.  CARDIAC:  Exam showed a regular rate and rhythm without S3, normal S1,  S2.  ABDOMEN:  Soft, nontender.  EXTREMITIES:  Showed no edema or clubbing.  SKIN:  Clear.  NEUROLOGIC:  Exam was intact.  HEENT:  Exam showed no jugular venous distention, no lymphadenopathy.  The oropharynx is clear.  NECK:  Supple.   IMPRESSION:  Moderate persistent asthma with upper respiratory tract-  type infection, early sinusitis.   PLAN:  For the patient to receive Zithromax 500 mg a day for a 3-day  course, and we will follow the patient back up in 3 months, sooner if  necessary.     Charlcie Cradle Delford Field, MD, [facility]  Electronically Signed    PEW/MedQ  DD: 03/10/2006  DT: 03/11/2006  Job #: [id number]   cc:   Georgina Quint. Plotnikov, MD

## 2010-05-31 NOTE — Assessment & Plan Note (Signed)
[facility]                               PULMONARY OFFICE NOTE   NAME:[name], [name]                        MRN:          [id number]  DATE:10/23/2005                            DOB:          03/11/1945    Ms. [name] is a 65 year old white female, history of moderate  persistent asthma, osteopenia, allergic rhinitis, elevated levels of IgE.  She has declined Xolair therapy in the past.  She maintains Asmanex 1 spray  daily, Maxair p.r.n.  She is querying whether she should take medicine for  hypertension.  I have encouraged her to see Dr. Posey Rea for her  hypertension.   PHYSICAL EXAMINATION:  VITAL SIGNS:  Temp 98, blood pressure 146/78, pulse  64, saturation 99% on room air.  CHEST:  Clear without evidence of wheeze or rhonchi.  There was no evidence  of adventitious breath sounds.  ABDOMEN:  Soft and nontender.  Bowel sounds active.  EXTREMITIES:  No clubbing or edema.  SKIN:  Clear.  NEUROLOGIC:  Intact.   IMPRESSION:  Mild intermittent asthma, stable at this time, with significant  atopic features.   PLAN:  Maintain Asmanex 1 spray daily, Maxair as needed, and we will see the  patient back in four months.  A flu vaccine was given.       Charlcie Cradle Delford Field, MD, FCCP      PEW/MedQ  DD:  10/23/2005  DT:  10/25/2005  Job #:  [id number]   cc:   Georgina Quint. Plotnikov, MD

## 2010-05-31 NOTE — Assessment & Plan Note (Signed)
[facility]                             PULMONARY OFFICE NOTE   NAME:[name] [name] CHIPLEY                        MRN:          [id number]  DATE:05/12/2006                            DOB:          [birth year]    HISTORY OF PRESENT ILLNESS:  This patient is a 65 year old white female  patient of Dr. Lynelle [name] who has a known history of moderate persistent  asthma, allergic rhinitis, presents today for an acute office visit.  The patient complains over the last four days that she has had cough,  congestion, wheezing.  The patient has recently returned from a trip  from [place] and reports symptoms started shortly after returning back  home.  She denies any hemoptysis, orthopnea, PND, leg swelling, calf  pain, dizziness or palpitations.  The patient did start on a Z-Pak and a  prednisone taper which she is currently on, 30 mg.   PHYSICAL EXAMINATION:  The patient is a pleasant female in no acute  distress.  She is afebrile.  VITAL SIGNS:  O2 saturation is 97% on room air.  HEENT:  Nasal mucosa with some mild erythema.  Nontender sinus .  Patient's oropharynx is clear.  NECK:  Supple.  No cervical adenopathy.  No JVD.  LUNGS:  Lung sounds are clear without any wheezing or crackles.  CARDIOVASCULAR:  Regular rate and rhythm.  ABDOMEN:  Soft, nontender.  EXTREMITIES:  Warm without any clubbing, cyanosis or edema.  Negative  homan's sign .   IMPRESSION AND PLAN:  Mild asthmatic flare.  The patient is to finish Z-  Pak as recommended. Use Mucinex DM twice a day.  Increase Asmanex up to  two puffs daily over the next week.  The patient was given a Xopenex  nebulizer treatment in the office.  The patient is to return back as  scheduled with Dr. Delford [name] or sooner if needed.      Rubye Oaks, NP  Electronically Signed      Charlcie Cradle [name] Field, MD, [facility]  Electronically Signed   TP/MedQ  DD: 05/13/2006  DT: 05/13/2006  Job #: [id number]

## 2010-06-02 IMAGING — US US ABDOMEN COMPLETE
1 series · 13 of 25 positions shown · non-contrast
Comparison: 01/03/2004.

CLINICAL DATA: Acute cystitis.

ABDOMEN ULTRASOUND
TECHNIQUE: Complete abdominal ultrasound examination was performed
including evaluation of the liver, gallbladder, bile ducts,
pancreas, kidneys, spleen, IVC, and abdominal aorta.

[Series 1: us abdomen complete · 0.35mm/px · 13 of 91 slices shown]
[im 1/91]
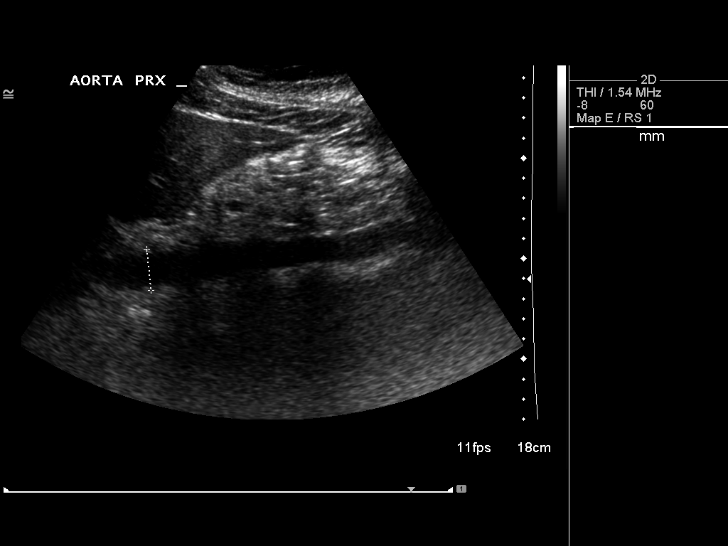
[im 8/91]
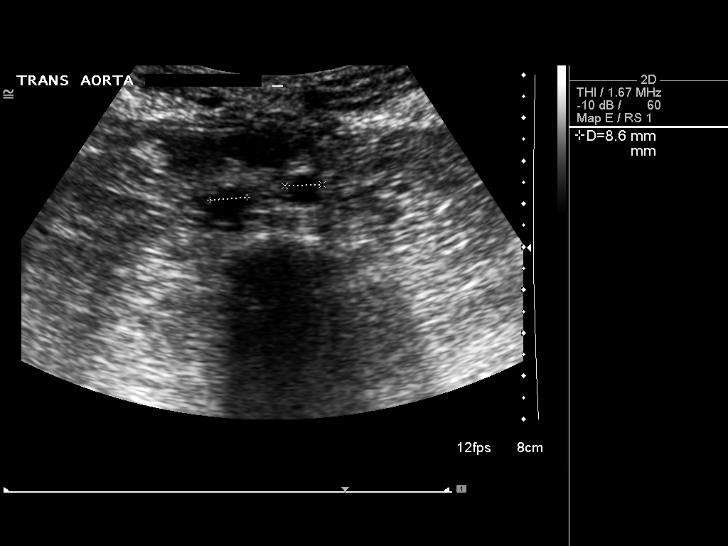
[im 16/91]
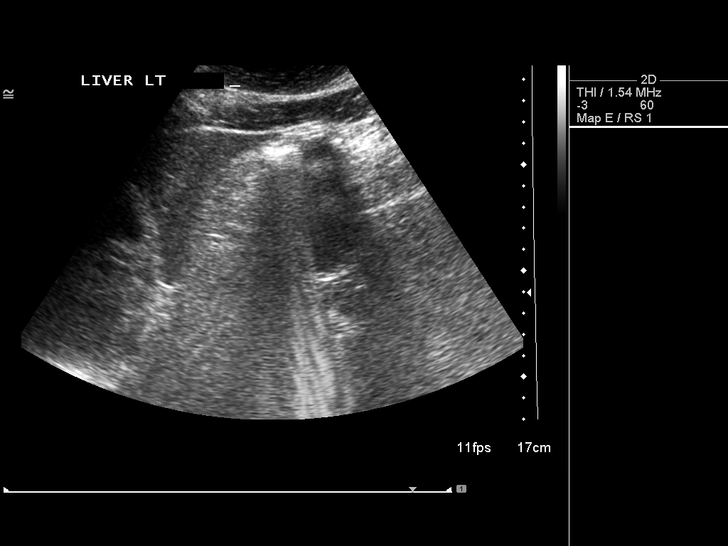
[im 23/91]
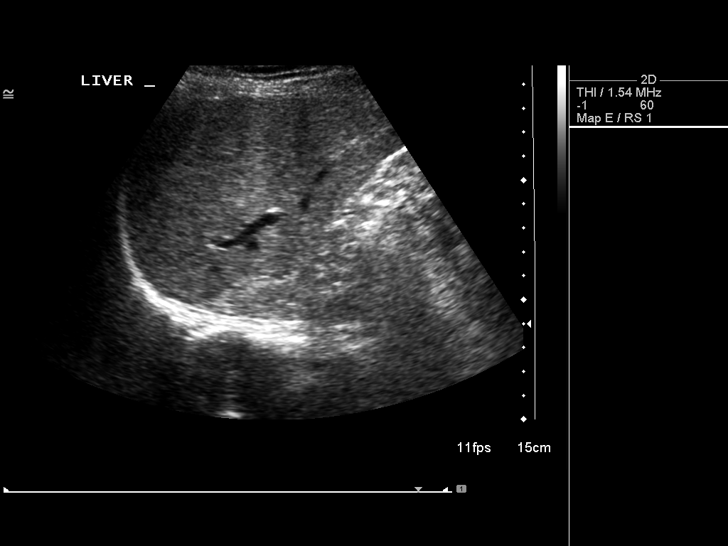
[im 31/91]
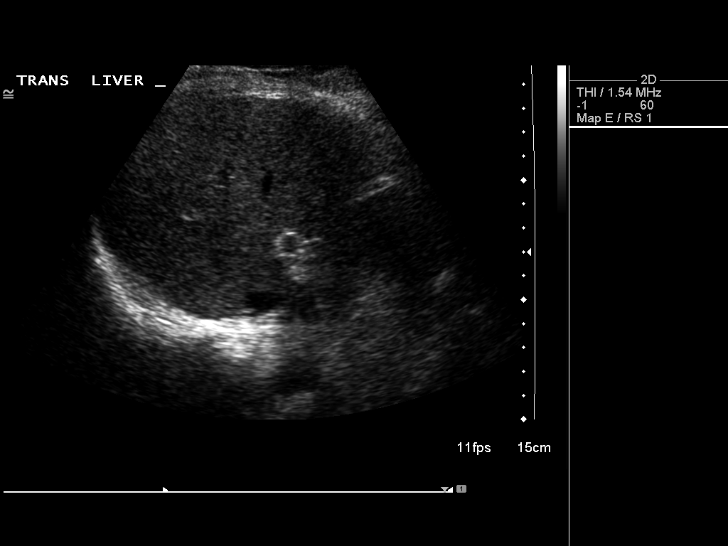
[im 38/91]
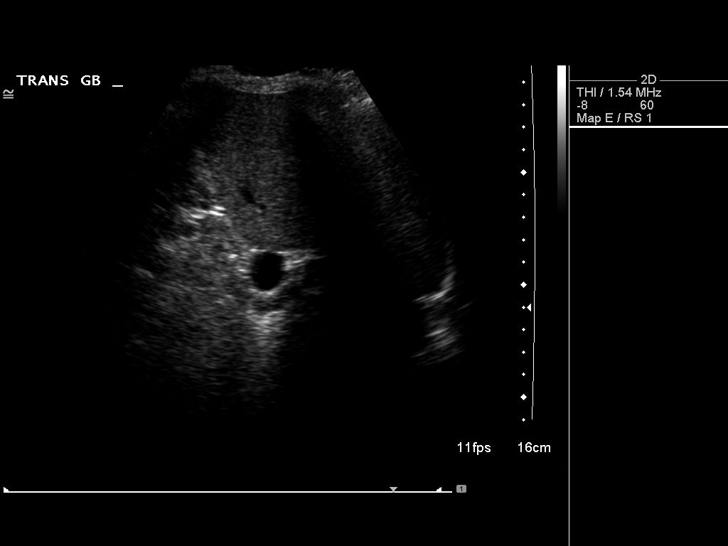
[im 46/91]
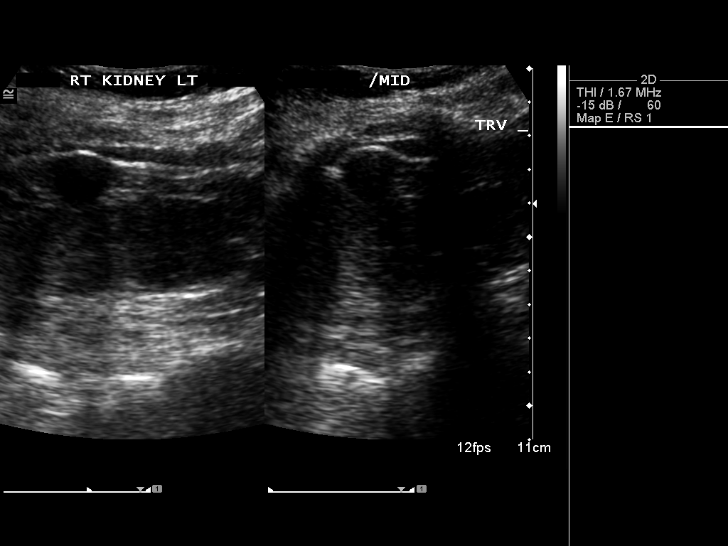
[im 53/91]
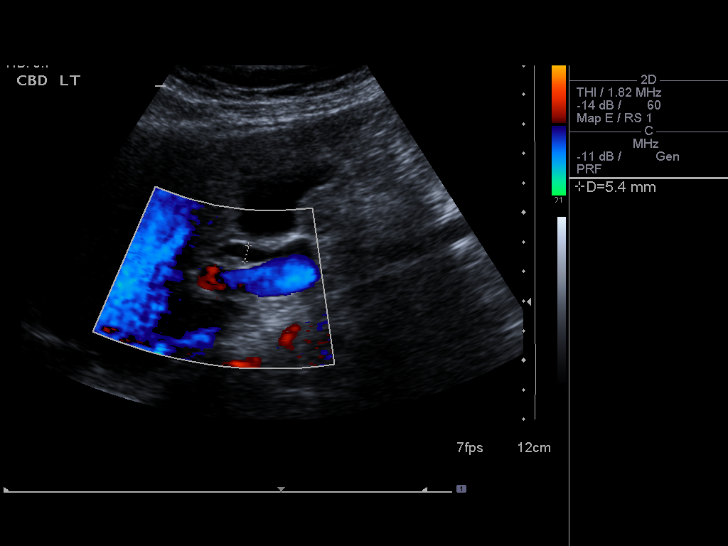
[im 61/91]
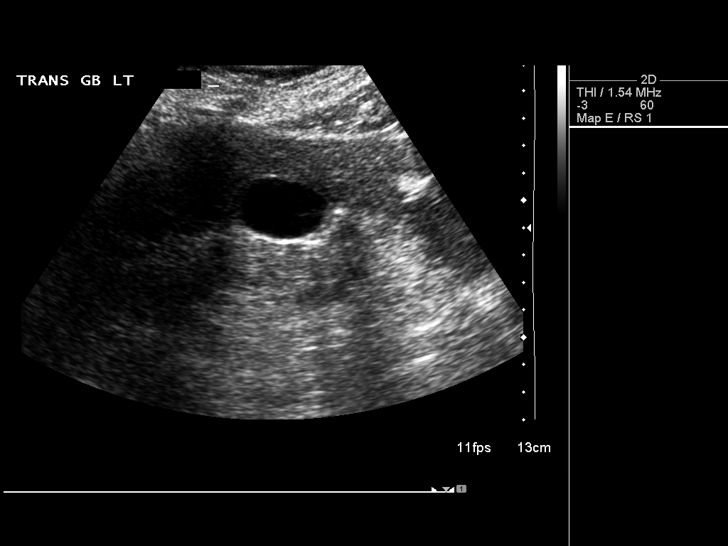
[im 68/91]
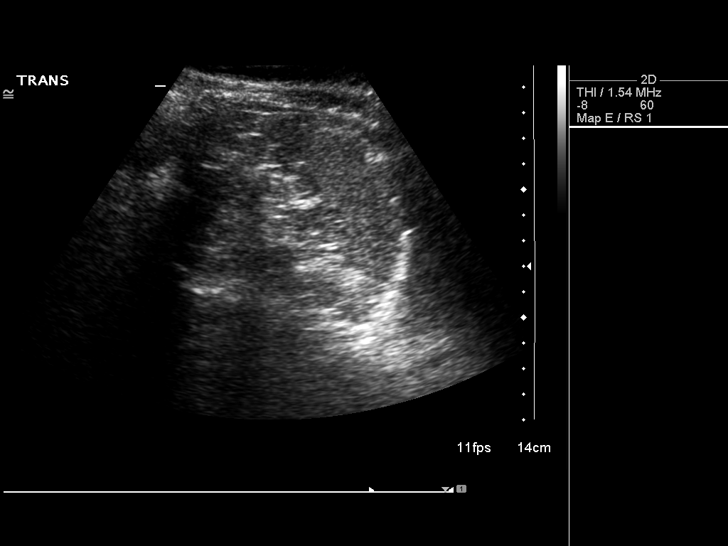
[im 76/91]
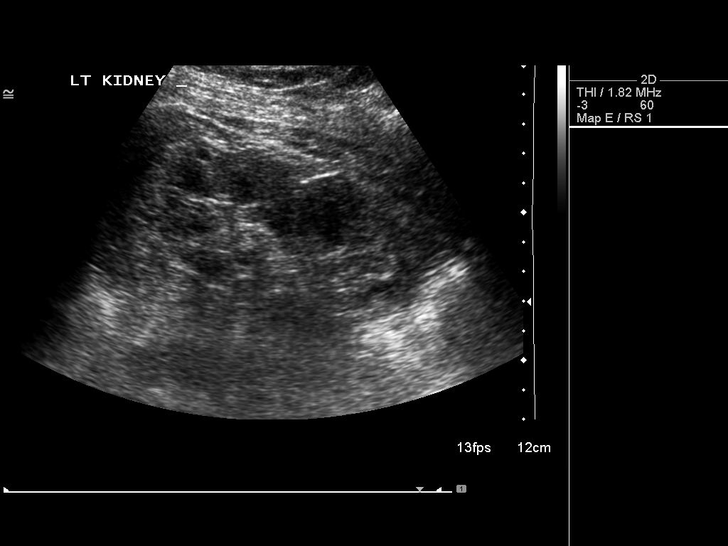
[im 83/91]
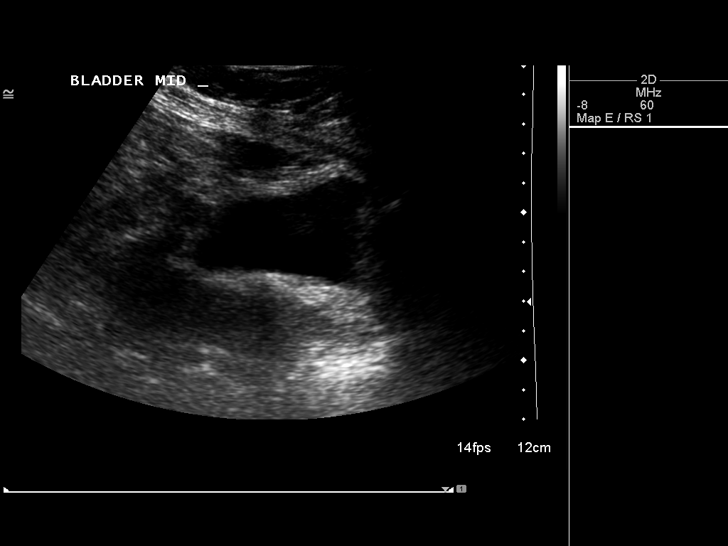
[im 91/91]
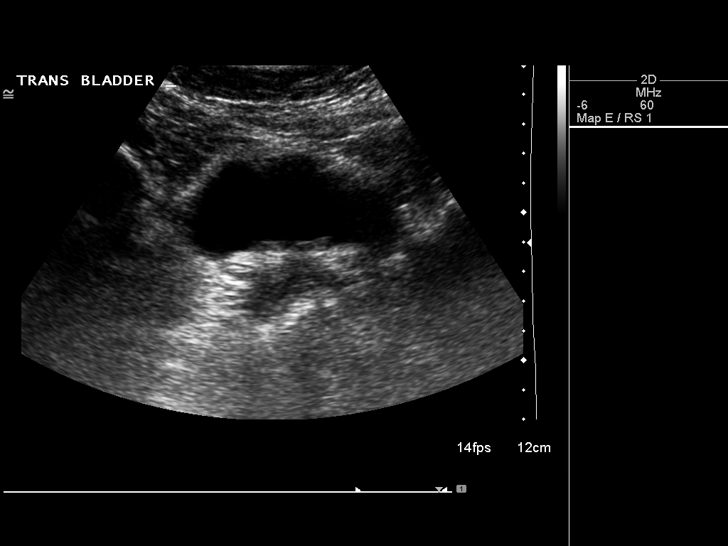

[13 of 25 positions shown; findings below may reference images not displayed]

FINDINGS: Gallbladder:  There is no evidence for gallstones, gallbladder wall
thickening or pericholecystic fluid.  The sonographer reports no
sonographic Murphy's sign.

Common Bile Duct:  Nondilated at five - 6 mm diameter

Liver:  Normal

IVC:  Normal

Pancreas:  Normal

Spleen:  Normal

Right kidney:  10.9 cm in long axis.  Renal cortical echogenicity
is borderline increased.  Several tiny cortical cysts are
identified and a 1.8 cm complex cystic lesion is identified in the
interpolar region.

Left kidney:  10.2 cm in long axis.  Mildly increased renal
cortical echogenicity.  12 mm anechoic cortical cyst is identified.

Abdominal Aorta:  No evidence for aneurysm.

Urinary bladder:  The bladder is minimally distended.  There is
some irregularity of the bladder wall, but this is not unexpected
given the degree of underdistension.
IMPRESSION: Under distended urinary bladder is unremarkable by ultrasound.  The

1.8 cm complex cystic lesion identified in the interpolar region of
the right kidney.  This was not visualized on the previous
ultrasound exam from 01/03/2004.  Given the persistent internal low
level echoes in this lesion, follow-up is recommended.  Sonographic
surveillance could be used to determine stability.  MRI without and
with contrast would be the study of choice to more definitively
characterize the lesion at this time.

## 2010-07-20 ENCOUNTER — Emergency Department (HOSPITAL_COMMUNITY): Payer: BC Managed Care – PPO

## 2010-07-20 ENCOUNTER — Inpatient Hospital Stay (HOSPITAL_COMMUNITY)
Admission: EM | Admit: 2010-07-20 | Discharge: 2010-07-22 | DRG: 134 | Disposition: A | Discharge status: Home / Self Care | Payer: BC Managed Care – PPO | Attending: Internal Medicine | Admitting: Internal Medicine

## 2010-07-20 DIAGNOSIS — M5137 Other intervertebral disc degeneration, lumbosacral region: Secondary | ICD-10-CM | POA: Diagnosis present

## 2010-07-20 DIAGNOSIS — M51379 Other intervertebral disc degeneration, lumbosacral region without mention of lumbar back pain or lower extremity pain: Secondary | ICD-10-CM | POA: Diagnosis present

## 2010-07-20 DIAGNOSIS — E785 Hyperlipidemia, unspecified: Secondary | ICD-10-CM | POA: Diagnosis present

## 2010-07-20 DIAGNOSIS — I10 Essential (primary) hypertension: Principal | ICD-10-CM | POA: Diagnosis present

## 2010-07-20 DIAGNOSIS — R209 Unspecified disturbances of skin sensation: Secondary | ICD-10-CM | POA: Diagnosis present

## 2010-07-20 LAB — CBC
HCT: 37 % (ref 36.0–46.0)
Hemoglobin: 12.8 g/dL (ref 12.0–15.0)
MCH: 31.3 pg (ref 26.0–34.0)
MCHC: 34.6 g/dL (ref 30.0–36.0)
MCV: 90.5 fL (ref 78.0–100.0)
Platelets: 221 10*3/uL (ref 150–400)
RBC: 4.09 MIL/uL (ref 3.87–5.11)
RDW: 13.1 % (ref 11.5–15.5)
WBC: 4.6 10*3/uL (ref 4.0–10.5)

## 2010-07-20 LAB — GLUCOSE, CAPILLARY: Glucose-Capillary: 98 mg/dL (ref 70–99)

## 2010-07-20 LAB — RAPID URINE DRUG SCREEN, HOSP PERFORMED
Amphetamines: NOT DETECTED
Barbiturates: NOT DETECTED
Benzodiazepines: NOT DETECTED
Cocaine: NOT DETECTED
Opiates: NOT DETECTED
Tetrahydrocannabinol: NOT DETECTED

## 2010-07-20 LAB — URINALYSIS, ROUTINE W REFLEX MICROSCOPIC
Bilirubin Urine: NEGATIVE
Glucose, UA: NEGATIVE mg/dL
Hgb urine dipstick: NEGATIVE
Ketones, ur: NEGATIVE mg/dL
Leukocytes, UA: NEGATIVE
Nitrite: NEGATIVE
Protein, ur: NEGATIVE mg/dL
Specific Gravity, Urine: 1.012 (ref 1.005–1.030)
Urobilinogen, UA: 0.2 mg/dL (ref 0.0–1.0)
pH: 8 (ref 5.0–8.0)

## 2010-07-20 LAB — COMPREHENSIVE METABOLIC PANEL
ALT: 21 U/L (ref 0–35)
AST: 24 U/L (ref 0–37)
Albumin: 4.2 g/dL (ref 3.5–5.2)
Alkaline Phosphatase: 52 U/L (ref 39–117)
BUN: 16 mg/dL (ref 6–23)
CO2: 28 mEq/L (ref 19–32)
Calcium: 9.9 mg/dL (ref 8.4–10.5)
Chloride: 103 mEq/L (ref 96–112)
Creatinine, Ser: 0.88 mg/dL (ref 0.50–1.10)
GFR calc Af Amer: >60 mL/min (ref >60)
GFR calc non Af Amer: >60 mL/min (ref >60)
Glucose, Bld: 93 mg/dL (ref 70–99)
Potassium: 3.8 mEq/L (ref 3.5–5.1)
Sodium: 140 mEq/L (ref 135–145)
Total Bilirubin: 0.4 mg/dL (ref 0.3–1.2)
Total Protein: 7.2 g/dL (ref 6.0–8.3)

## 2010-07-20 LAB — DIFFERENTIAL
Basophils Absolute: 0 10*3/uL (ref 0.0–0.1)
Basophils Relative: 0 % (ref 0–1)
Eosinophils Absolute: 0.1 10*3/uL (ref 0.0–0.7)
Eosinophils Relative: 2 % (ref 0–5)
Lymphocytes Relative: 45 % (ref 12–46)
Lymphs Abs: 2.1 10*3/uL (ref 0.7–4.0)
Monocytes Absolute: 0.4 10*3/uL (ref 0.1–1.0)
Monocytes Relative: 10 % (ref 3–12)
Neutro Abs: 2 10*3/uL (ref 1.7–7.7)
Neutrophils Relative %: 43 % (ref 43–77)

## 2010-07-20 LAB — PROTIME-INR
INR: 0.95 (ref 0.00–1.49)
Prothrombin Time: 12.9 seconds (ref 11.6–15.2)

## 2010-07-20 LAB — CK TOTAL AND CKMB (NOT AT ARMC)
CK, MB: 2.3 ng/mL (ref 0.3–4.0)
Relative Index: 1 (ref 0.0–2.5)
Total CK: 231 U/L — ABNORMAL HIGH (ref 7–177)

## 2010-07-20 LAB — APTT: aPTT: 31 seconds (ref 24–37)

## 2010-07-20 LAB — HEMOGLOBIN A1C
Hgb A1c MFr Bld: 5.9 % — ABNORMAL HIGH (ref <5.7)
Mean Plasma Glucose: 123 mg/dL — ABNORMAL HIGH (ref <117)

## 2010-07-20 LAB — TROPONIN I: Troponin I: <0.3 ng/mL (ref <0.30)

## 2010-07-20 NOTE — H&P (Signed)
[name], [name] NO.:  [id number]  MEDICAL RECORD NO.:  [id number]  LOCATION:  [facility]                         FACILITY:  [facility]  PHYSICIAN:  Marinda Elk, M.D.DATE OF BIRTH:  1946/01/02  DATE OF ADMISSION:  07/20/2010 DATE OF DISCHARGE:                             HISTORY & PHYSICAL   PRIMARY CARE DOCTOR:  __________  CHIEF COMPLAINT:  Left extremity numbness.  HISTORY OF PRESENT ILLNESS:  This is a 65 year old with past medical history of hypertension.  Currently, on no medication, diagnosed about 2 years ago.  Trying to bring it down with exercise and diet.  Comes in for left leg numbness that started on Monday.  She relates she did not pay much attention to this, but describes that the pain and needle feeling in her left lower extremity.  She relates no weakness in this leg, but does relate that about this morning she started having left facial numbness.  Has had occasional headaches intermittently for the past week.  She did not pay much attention to it.  She said her blood pressure at home was really high.  She has not had any falls and syncopal episode.  No confusion, weakness, chest pain, shortness of breath, diarrhea, nausea, or vomiting.  So, we were asked to admit and further evaluate.  ALLERGIES:  No known drug allergies.  PAST MEDICAL HISTORY:  Hypertension, on medication.  She is on aspirin 81 mg and albuterol p.r.n.  SOCIAL HISTORY:  She denies tobacco or drugs, but she drinks alcohol occasionally.  FAMILY HISTORY:  Her father died of heart failure complications at 44. Her mother died of massive CVA with the age of 51.  She lives here in [place].  REVIEW OF SYSTEMS:  A 10-point review of systems done.  Pertinent positives per HPI.  PHYSICAL EXAMINATION:  VITAL SIGNS:  Temperature 98.9, heart rate of 88, blood pressure 181/78, repeated was 169/91.  She is breathing 17 times a minute, satting 96% on room air.  GENERAL:  She  is awake, alert, and oriented x3, very pleasantly female.  HEENT:  Anicteric, atraumatic. Pupils equally round, reactive to light.  Anicteric.  No jaundice.  No JVD.  No bruits.  Moist mucous membrane.  CARDIOVASCULAR:  She has regular rate and rhythm with positive S1, S2.  No murmurs, rubs, or gallops.  LUNGS: She has good air movement, clear to auscultation. ABDOMEN:  Positive bowel sounds, nontender, nondistended, soft. EXTREMITIES:  Positive pulses.  No clubbing, cyanosis, or edema.  SKIN: She had no rashes or ulcerations.  NEURO:  She is awake, alert, and oriented x4, coherent, and fluent language, III through XII are grossly intact except for mild left-sided facial droop, most noticeable when she smiles.  Muscle strength is 5/5 in all four extremities.  Deep tendon reflexes are 2+.  Sensation is intact throughout except on her face, on her left side, which she feels a little bit numb.  She also relates some numbness in the lateral part of the left foot like pins and needles. Deep tendon reflexes are 2+ bilaterally.  EKG is pending at the time of dictation.  Labs on admission shows sodium 140, potassium  3.8, chloride 103, bicarb of 28, glucose of 93, BUN of 16, creatinine 0.8, bilirubin 0.4, alkaline phosphatase 52, AST 24, ALT 21, total protein 7.2, albumin 4.2, calcium 9.9.  First set of cardiac enzymes negative x1.  Her white count is 4.6, hemoglobin of 12.8, platelet count of 221, ANC of 2.0.  CT scan of the head showed no evidence of acute intracranial abnormalities.  There is cerebral volume loss appropriate for age.  No ventriculomegaly.  There is subventricular white matter and periventricular small vessel ischemic changes.  ASSESSMENT/PLAN: 1. Left foot numbness and left facial numbness, most likely a stroke.     We will get an MRI to confirm.  We will get a carotid Doppler, 2-D     echo, fasting lipid panel.  She failed aspirin as an outpatient, so     we will  discontinue aspirin, start her on Plavix.  Get PT consult.     She passed swallowing evaluations, we will give her regular diet.     We will also check an EKG and UA for any kind of infections. 2. Hypertension.  Currently, on no meds.  A repeated blood pressure     was 169/91.  So, we will continue to monitor if needed.  We will     add a low-dose ACE, p.o.  We will like not to treat her blood     pressure unless it becomes greater than 200.  Has his normal     physiologic response.     Marinda Elk, M.D.     AF/MEDQ  D:  07/20/2010  T:  07/20/2010  Job:  [id number]

## 2010-07-21 LAB — SEDIMENTATION RATE: Sed Rate: 10 mm/hr (ref 0–22)

## 2010-07-21 LAB — TSH: TSH: 2.525 u[IU]/mL (ref 0.350–4.500)

## 2010-07-21 LAB — GLUCOSE, CAPILLARY
Glucose-Capillary: 103 mg/dL — ABNORMAL HIGH (ref 70–99)
Glucose-Capillary: 93 mg/dL (ref 70–99)
Glucose-Capillary: 93 mg/dL (ref 70–99)
Glucose-Capillary: 138 mg/dL — ABNORMAL HIGH (ref 70–99)

## 2010-07-21 LAB — MAGNESIUM: Magnesium: 2.3 mg/dL (ref 1.5–2.5)

## 2010-07-21 LAB — VITAMIN B12: Vitamin B-12: 425 pg/mL (ref 211–911)

## 2010-07-21 LAB — LIPID PANEL
Cholesterol: 219 mg/dL — ABNORMAL HIGH (ref 0–200)
HDL: 85 mg/dL (ref >39)
LDL Cholesterol: 113 mg/dL — ABNORMAL HIGH (ref 0–99)
Total CHOL/HDL Ratio: 2.6 RATIO
Triglycerides: 107 mg/dL (ref <150)
VLDL: 21 mg/dL (ref 0–40)

## 2010-07-21 LAB — RPR: RPR Ser Ql: NONREACTIVE

## 2010-07-22 ENCOUNTER — Telehealth: Payer: Self-pay

## 2010-07-22 LAB — GLUCOSE, CAPILLARY: Glucose-Capillary: 112 mg/dL — ABNORMAL HIGH (ref 70–99)

## 2010-07-22 LAB — ANA: Anti Nuclear Antibody(ANA): NEGATIVE

## 2010-07-22 NOTE — Consult Note (Signed)
[name], [name]:  [id number]  MEDICAL RECORD NO.:  [id number]  LOCATION:  1440                         FACILITY:  [facility]  PHYSICIAN:  Thana Farr, MD    DATE OF BIRTH:  12-21-1945  DATE OF CONSULTATION:  07/21/2010 DATE OF DISCHARGE:                                CONSULTATION   Neurology Consultation  CONSULT CALLED BY:  Conley Canal, M.D.  HISTORY:  Ms. [name] is a 65 year old female with past medical history of hypertension and lumbar disk, who reports that approximately 10 days ago after leaving the gym began to have numbness on lateral aspect of her left foot.  This has continued.  Reports that on the day of admission at about approximately 8 a.m. in the morning developed numbness on the lower portion of the left side of her face.  The numbness on the face has resolved.  At the time of its occurrence, the patient did note that she had elevated blood pressure.  Patient called her physician and presented to the Emergency Department.  High blood pressure documented at this time was 181/78.  Patient's facial symptoms have now resolved.  Blood pressure is well-controlled.  Continues to have numbness in the left side of the foot.  In workup, had a MRI of the brain.  No acute intracranial abnormalities were noted, but the patient did have some mild periventricular and subcortical white matter changes. There was a possibility of MS raised as well as vasculitis.  Consult was called for further recommendations.  PAST MEDICAL HISTORY: 1. Hypertension. 2. Lumbar disk for which patient was seen at [facility].  Has not been seen     there in the past couple of years.  MEDICATIONS AT HOME: 1. Aspirin 81 mg. 2. Albuterol.  SOCIAL HISTORY:  The patient drinks alcohol ON occasions.  There is no history of tobacco or illicit drug abuse.  PHYSICAL EXAMINATION:  VITAL SIGNS:  Blood pressure 134/70, heart rate 71, respiratory rate 18, T-max 98.2. MENTAL  STATUS TESTING:  The patient is alert and oriented.  She can follow commands without difficulty.  Speech is fluent. CRANIAL NERVE TESTING:  II:  Visual fields grossly intact.  III, IV, VI: Extraocular movements intact.  Pupils reactive bilaterally.  V and VII: Smile symmetric.  Pinprick exam intact.  VIII:  Grossly intact.  IX and X:  Positive gag.  XI:  Bilateral shoulder shrug.  XII:  Midline tongue extension. MOTOR EXAMINATION:  Patient is 5/5 bilaterally.  There is normal tone and bulk. SENSORY:  Pinprick and light touch are intact in both upper extremities and on the right lower extremity.  There is decreased pinprick and light touch on the lateral aspect of the left foot and including the last toes on the left foot.  Deep tendon reflexes are 1+, absent ankle jerks. Plantars are mute bilaterally. CEREBELLAR TESTING:  Finger-to-nose and heel-to-shin intact.  LABORATORY DATA:  Hemoglobin A1c 5.9, cholesterol 219, LDL 113.  Sodium 140, potassium 3.8, chloride 103, bicarb 28, BUN 16, creatinine 0.88, glucose 93, total bili 0.4, SGOT and SGPT 24 and 21 respectively.  White blood cell count 4.6, platelet count 221, hemoglobin 12.8,  hematocrit 37.  Alk phos 52, total protein 7.2, albumin 4.2, magnesium of 2.3, calcium 9.9.  DIAGNOSTIC STUDIES:  MRI of the brain shows mild periventricular and subcortical white matter changes.  ASSESSMENT:  [name] is a 65 year old female that presented with areas of numbness.  I do not believe that the two are related.  Her facial numbness was likely related to her hypertension and now that her blood pressure has been addressed, her numbness has resolved.  She was on an aspirin a day.    Her other numbness is in the foot and is likely related to her disk that she has been aware of for dome time.  She does not have any weakness associated with this and therefore at this time would not need emergent re-evaluation, but would likely benefit by seeing her  orthopedic doctor at [facility] again and having her back re-imaged.  PLAN: 1. Patient to follow up at [facility] for her herniated discs at [facility] request.  Patient does wish to follow up     there.  They will perform repeat imaging as they deem     appropriate. 2. Patient advised to be compliant with blood pressure medications. 3. Patient to increase anticoagulation from 81 mg aspirin to 325 mg     aspirin daily. 4. MRI has been reviewed.  At this point, I think that MS is very low     on the differential.  She would not fulfill criteria to be     diagnosed with MS at this point either.  What would be most     appropriate for continued workup at this point would be for patient to     have a repeat MRI in 6 months.  She may follow up with [facility] for this to be performed. 5. Sedimentation rate.          ______________________________ Thana Farr, MD     LR/MEDQ  D:  07/21/2010  T:  07/21/2010  Job:  [id number]  Electronically Signed by Thana Farr MD on 07/22/2010 04:30:02 PM

## 2010-07-22 NOTE — Telephone Encounter (Signed)
[facility] Triage Call Report Triage Record Num: [id number] Operator: Amy Head Patient Name: Kamilia Carollo Call Date & Time: 07/20/2010 9:06:22AM Patient Phone: [phone] PCP: Sonda Primes Patient Gender: Female PCP Fax : [phone] Patient DOB: 1945-10-16 [facility] Name[facility] Reason for Call: PT/Luciel calling for numbness to left side of left foot. Onset 07/15/10. BP 170/90. States diastolic bP has been 150-170. Is not taking any meds for BP, is trying to treat with diet and exercise. Advised 911 per guidelines. Protocol(s) Used: Hypertension, Diagnosed or Suspected Recommended Outcome per Protocol: Activate EMS 911 Reason for Outcome: New unexplained weakness/paralysis, change in sensation (numbness or tingling) or inability to purposely move, especially when one side of body is involved Care Advice: ~ Protect the patient from falling or other harm. ~ Do not give the patient anything to eat or drink. ~ An adult should stay with the patient, preferably one trained in CPR. ~ IMMEDIATE ACTION Write down provider's name. List or place the following in a bag for transport with the patient: current prescription and/or nonprescription medications; alternative treatments, therapies and medications; and street drugs. ~ 07/20/2010 9:16:03AM Page 1 of 1 [email or web address]_[email or web address]

## 2010-07-22 NOTE — Telephone Encounter (Signed)
Noted. Thx.

## 2010-07-23 ENCOUNTER — Encounter: Payer: Self-pay | Admitting: Internal Medicine

## 2010-07-25 ENCOUNTER — Encounter: Payer: Self-pay | Admitting: Internal Medicine

## 2010-07-25 NOTE — Discharge Summary (Signed)
NAMEYVANA, SAMONTE                 ACCOUNT NO.:  [id number]  MEDICAL RECORD NO.:  [id number]  LOCATION:  1440                         FACILITY:  [facility]  PHYSICIAN:  Conley Canal, MD      DATE OF BIRTH:  1945-12-13  DATE OF ADMISSION:  07/20/2010 DATE OF DISCHARGE:  07/22/2010                              DISCHARGE SUMMARY   PRIMARY CARE PHYSICIAN:  Georgina Quint. Plotnikov, MD  CONSULTING PHYSICIAN:  Thana Farr, MD  DISCHARGE DIAGNOSES: 1. Facial numbness probably secondary to uncontrolled hypertension. 2. Left foot numbness, thought to be related to degenerative joint     disk disease, being followed at [facility] 3. Hyperlipidemia. 4. Hypertension, not on medications until now.  DISCHARGE MEDICATIONS: 1. Hydrochlorothiazide 12.5 mg daily. 2. KCl 10 mEq daily while on HCTZ. 3. Zocor 20 mg q.h.s. 4. Aspirin 325 mg daily. 5. Citracal 1 tablet daily. 6. Maxair 2 puffs 3 times daily as needed. 7. Multivitamins 1 tablet daily. 8. QVAR 80 mcg 2 puffs inhalations twice daily.  PROCEDURES PERFORMED: 1. CT of the brain without contrast on July 20, 2010, showed no     evidence of acute intracranial abnormality. 2. MRI of the brain without contrast on July 20, 2010, showed no acute     intracranial abnormality, some mild periventricular and subcortical     white matter changes, slightly advanced for age and nonspecific     finding, which can be seen in the setting of chronic microvascular     ischemia, although a demyelinating process such as multiple     sclerosis, vasculitis, complicated migraine headaches all have the     sequela of prior infectious or inflammatory process.  It also     showed minimal fluid in the mastoid air cells bilaterally. 3. Bilateral carotid duplex on July 22, 2010, unremarkable. 4. A 2-D echocardiogram on July 22, 2010, result pending at discharge.     We will communicate the results to the patient once available.  HOSPITAL COURSE:  Ms. Nangle was admitted on  July 20, 2010, with left extremity numbness preceded by left facial numbness and some blurry vision of one eye, which she could not remember, which resolved on its own.  There was concern for acute CVA.  Hence, admission to the hospital with MRI findings as noted above.  She also had workup including ANA, RPR, vitamin B12, TSH and sed rate, which were all unremarkable.  A lipid panel showed a total cholesterol of 219, HDL 85, LDL 113 and hemoglobin A1c 5.9.  She was  seen by Neurology, Dr. Thad Ranger who felt that the numbness in the face and the feet were not related, but the facial numbness was likely related to hypertension and the foot numbness was probably related to a prior lower lumbar disk problem, which is being followed at [facility].  The patient was started on hydrochlorothiazide for hypertension as she says that she has been exercising to try to avoid taking blood pressure medications.  Given that she is not overweight, she may not benefit much else from more exercise and she was agreeable to starting hydrochlorothiazide.  She was also found to be hyperlipidemic and was started  on Zocor.  Her blood pressure has improved to 120s systolic and she should follow with Dr. Posey Rea as well as her doctor at [facility].  If recurrence of symptoms, she will need further workup Neurology wise.  Otherwise, she is discharged in stable condition.  TIME SPENT:  The time spent for this discharge preparation is less than 30 minutes.     Conley Canal, MD     SR/MEDQ  D:  07/22/2010  T:  07/22/2010  Job:  [id number]  cc:   Georgina Quint. Plotnikov, MD [address] [place] [address]  Thana Farr, MD  Electronically Signed by Conley Canal  on 07/25/2010 07:30:55 PM

## 2010-07-29 ENCOUNTER — Encounter: Payer: Self-pay | Admitting: Internal Medicine

## 2010-07-29 ENCOUNTER — Ambulatory Visit (INDEPENDENT_AMBULATORY_CARE_PROVIDER_SITE_OTHER): Payer: BC Managed Care – PPO | Admitting: Internal Medicine

## 2010-07-29 VITALS — BP 170/88 | HR 72 | Temp 98.5°F | Ht 61.0 in | Wt 117.0 lb

## 2010-07-29 DIAGNOSIS — E785 Hyperlipidemia, unspecified: Secondary | ICD-10-CM

## 2010-07-29 DIAGNOSIS — J45909 Unspecified asthma, uncomplicated: Secondary | ICD-10-CM

## 2010-07-29 DIAGNOSIS — I10 Essential (primary) hypertension: Secondary | ICD-10-CM

## 2010-07-29 DIAGNOSIS — J329 Chronic sinusitis, unspecified: Secondary | ICD-10-CM

## 2010-07-29 DIAGNOSIS — R030 Elevated blood-pressure reading, without diagnosis of hypertension: Secondary | ICD-10-CM

## 2010-07-29 DIAGNOSIS — I519 Heart disease, unspecified: Secondary | ICD-10-CM

## 2010-07-29 DIAGNOSIS — G459 Transient cerebral ischemic attack, unspecified: Secondary | ICD-10-CM

## 2010-07-29 MED ORDER — LOSARTAN POTASSIUM-HCTZ 100-25 MG PO TABS: 1.0000 | ORAL_TABLET | Freq: Every day | ORAL | Status: DC

## 2010-07-29 MED ORDER — AMOXICILLIN-POT CLAVULANATE 875-125 MG PO TABS: 1.0000 | ORAL_TABLET | Freq: Two times a day (BID) | ORAL | Status: AC

## 2010-07-29 MED ORDER — VITAMIN D 1000 UNITS PO TABS: 1000.0000 [IU] | ORAL_TABLET | Freq: Every day | ORAL | Status: DC

## 2010-07-29 NOTE — Progress Notes (Signed)
  Subjective:    Patient ID: [name], female    DOB: [birth year], 65 y.o.   MRN: [id number]  HPI  Post-hosp stay for ?? CVA and L face numbness and L foot outer side numbness She had a neuro cons  Review of Systems  Constitutional: Negative for chills, activity change, appetite change, fatigue and unexpected weight change.  HENT: Negative for congestion, mouth sores and sinus pressure.   Eyes: Negative for visual disturbance.  Respiratory: Negative for cough and chest tightness.   Gastrointestinal: Negative for nausea, abdominal pain, diarrhea and blood in stool.  Genitourinary: Negative for frequency, difficulty urinating, vaginal pain and pelvic pain.  Musculoskeletal: Negative for back pain and gait problem.  Skin: Negative for pallor and rash.  Neurological: Positive for numbness and headaches. Negative for dizziness, tremors, seizures, syncope, facial asymmetry, speech difficulty, weakness and light-headedness.  Psychiatric/Behavioral: Negative for confusion, sleep disturbance and decreased concentration. The patient is nervous/anxious.        Objective:   Physical Exam  Constitutional: She is oriented to person, place, and time. She appears well-developed and well-nourished. No distress.  HENT:  Head: Normocephalic.  Right Ear: External ear normal.  Left Ear: External ear normal.  Nose: Nose normal.  Mouth/Throat: Oropharynx is clear and moist.  Eyes: Conjunctivae are normal. Pupils are equal, round, and reactive to light. Right eye exhibits no discharge. Left eye exhibits no discharge.  Neck: Normal range of motion. Neck supple. No JVD present. No tracheal deviation present. No thyromegaly present.  Cardiovascular: Normal rate, regular rhythm and normal heart sounds.   Pulmonary/Chest: No stridor. No respiratory distress. She has no wheezes.  Abdominal: Soft. Bowel sounds are normal. She exhibits no distension and no mass. There is no tenderness. There is no rebound and  no guarding.  Musculoskeletal: She exhibits no edema and no tenderness.  Lymphadenopathy:    She has no cervical adenopathy.  Neurological: She is alert and oriented to person, place, and time. She displays normal reflexes. No cranial nerve deficit. She exhibits normal muscle tone. Coordination normal.  Skin: No rash noted. No erythema. No pallor.  Psychiatric: She has a normal mood and affect. Her behavior is normal. Judgment and thought content normal.       Hosp records, MRI, labs notes were reviewed   Assessment & Plan:   A complex case.Marland KitchenMarland Kitchen

## 2010-07-29 NOTE — Patient Instructions (Signed)
Normal BP<130/85 Take Augmentin if fever etc

## 2010-07-30 DIAGNOSIS — E785 Hyperlipidemia, unspecified: Secondary | ICD-10-CM | POA: Insufficient documentation

## 2010-07-30 DIAGNOSIS — G459 Transient cerebral ischemic attack, unspecified: Secondary | ICD-10-CM | POA: Insufficient documentation

## 2010-07-30 DIAGNOSIS — I10 Essential (primary) hypertension: Secondary | ICD-10-CM | POA: Insufficient documentation

## 2010-07-30 DIAGNOSIS — I519 Heart disease, unspecified: Secondary | ICD-10-CM

## 2010-07-30 HISTORY — DX: Transient cerebral ischemic attack, unspecified: G45.9

## 2010-07-30 HISTORY — DX: Heart disease, unspecified: I51.9

## 2010-07-30 HISTORY — DX: Hyperlipidemia, unspecified: E78.5

## 2010-07-30 NOTE — Assessment & Plan Note (Signed)
Resolved ASA 81 mg/d (due to bruising)

## 2010-07-30 NOTE — Assessment & Plan Note (Signed)
See Meds 

## 2010-07-30 NOTE — Assessment & Plan Note (Signed)
On Rx 

## 2010-07-30 NOTE — Assessment & Plan Note (Signed)
Augmentin

## 2010-07-30 NOTE — Assessment & Plan Note (Signed)
OK to d/c Simvastatin - poss side effects

## 2010-07-30 NOTE — Assessment & Plan Note (Signed)
Start Rx 

## 2010-07-30 NOTE — Assessment & Plan Note (Signed)
Started on Rx May need Norvasc added

## 2010-08-03 ENCOUNTER — Ambulatory Visit (INDEPENDENT_AMBULATORY_CARE_PROVIDER_SITE_OTHER): Payer: BC Managed Care – PPO | Admitting: Family Medicine

## 2010-08-03 ENCOUNTER — Encounter: Payer: Self-pay | Admitting: Family Medicine

## 2010-08-03 DIAGNOSIS — R5383 Other fatigue: Secondary | ICD-10-CM

## 2010-08-03 DIAGNOSIS — R5381 Other malaise: Secondary | ICD-10-CM

## 2010-08-03 DIAGNOSIS — R110 Nausea: Secondary | ICD-10-CM

## 2010-08-03 DIAGNOSIS — I10 Essential (primary) hypertension: Secondary | ICD-10-CM

## 2010-08-03 DIAGNOSIS — R531 Weakness: Secondary | ICD-10-CM

## 2010-08-03 NOTE — Patient Instructions (Signed)
Try reducing your losartan-hctz to one half tablet for the next few days. If blood pressure still consistently over 140/90 at that time try going back up to one full tablet daily. If you continue to feel weak and poor follow up with your primary physician sooner. Drink plenty of fluids. Bring your home meter and BP readings at f/u with Dr Posey Rea.

## 2010-08-04 NOTE — Progress Notes (Signed)
  Subjective:    Patient ID: [name], female    DOB: 09/01/1945, 65 y.o.   MRN: [id number]  HPI Saturday clinic for a 4 day history of loose stools, weakness, and nausea without any vomiting. Patient had recent admission for questionable TIA. Addition of hydrochlorothiazide 12.5 mg daily. Subsequently seen as outpatient and changed to losartan HCTZ. Since then she's had symptoms above and she is convinced is related to her change of medication. She denies any fever, abdominal pain, chest pain, vomiting, or any bloody stools. Has not taken losartan today but did take dose yesterday. Blood pressure by home reading today 130/80  Denies any recurrent numbness or focal weakness. No slurred speech.  PMH reviewed as below.  Past Medical History  Diagnosis Date  . Osteopenia   . Allergic rhinitis     seasonal  . Asthma     -FeV1 106% 2006  . Sinusitis   . Urinary tract infection, site not specified   . Hypertension    Past Surgical History  Procedure Date  . Bunionectomy     reports that she has never smoked. She does not have any smokeless tobacco history on file. She reports that she does not drink alcohol or use illicit drugs. family history includes Cancer (age of onset:89) in her mother; Parkinsonism in her father; and Stroke in her mother. Allergies  Allergen Reactions  . Albuterol   . Codeine   . Hydromorphone Hcl       Review of Systems  Constitutional: Positive for appetite change. Negative for fever, chills and activity change.  Respiratory: Negative for shortness of breath.   Cardiovascular: Negative for chest pain and leg swelling.  Gastrointestinal: Positive for nausea and diarrhea. Negative for vomiting, abdominal pain and blood in stool.  Genitourinary: Negative for dysuria.  Neurological: Positive for weakness and headaches. Negative for syncope.  Psychiatric/Behavioral: Negative for confusion.       Objective:   Physical Exam  Constitutional: She is  oriented to person, place, and time. She appears well-developed and well-nourished. No distress.  HENT:  Head: Normocephalic and atraumatic.  Eyes: Pupils are equal, round, and reactive to light.  Neck: Neck supple. No thyromegaly present.  Cardiovascular: Normal rate and regular rhythm.   Pulmonary/Chest: Effort normal. No respiratory distress. She has no wheezes. She has no rales.  Musculoskeletal: She exhibits no edema.  Neurological: She is alert and oriented to person, place, and time. No cranial nerve deficit.          Assessment & Plan:  Nonspecific constitutional symptoms of weakness, nausea, and intermittent loose stools. Patient is convinced that these are related to losartan and we explained this would be unusual. She is requesting going off losartan back to plain HCTZ. Better blood pressure by home readings-? "white coat" issues. We have recommended trying one half tablet daily of losartan HCTZ for the next few days and suspect she'll need addition of amlodipine for isolated systolic hypertension. Strongly encouraged to schedule followup with primary physician this week  Encouraged to bring back her cuff with home readings at follow up.

## 2010-08-05 ENCOUNTER — Telehealth: Payer: Self-pay

## 2010-08-05 NOTE — Telephone Encounter (Signed)
[facility] Triage Call Report Triage Record Num: [id number] Operator: Amy Head Patient Name: [name] Call Date & Time: 08/03/2010 9:50:32AM Patient Phone: [phone] PCP: Sonda Primes Patient Gender: Female PCP Fax : [phone] Patient DOB: 03-08-1945 [facility] Name[facility] Reason for Call: Pt/[name], calling and states that she was seen by Dr. Posey Rea on Tuesday, 07/30/10 for F/U hospitalization the weekend prior when she was dx with HTN. Was sent home on HCTZ and KCHL. Dr. Posey Rea chaned pt meds to Losartin and also placed pt on Amoxicillin for possible Sinus Infection. States that she has had a HA since starting Losartin and woke up this AM, 08/03/10 with Diarrhea X 1. Wants to be changed to another HTN medication. Is going on vacation on Monday, 08/05/10. All emergent sxs per Allergic Reaction and Diarrhea protocol r/o. Home care advice given. Advised pt to be seen within 24 hours per guidelines. Spoke with [facility], made appt for today, 08/03/10 @ 11:00AM. Protocol(s) Used: Allergic Reaction, Severe Recommended Outcome per Protocol: Provide Home/Self Care Reason for Outcome: All other situations Care Advice: ~ Recurring allergic problems should be evaluated by an provider. ~ HEALTH PROMOTION / MAINTENANCE ~ SYMPTOM / CONDITION MANAGEMENT Avoid allergy triggers that have caused any reaction. Read labels on food or medications carefully; ask about ingredients in food when eating out. If allergic to stinging insects, wear long-sleeved shirts and trousers and closed toe shoes. Do not walk barefoot. Ask healthcare provider about carrying emergency allergy medication. ~ ~ CAUTIONS During pregnancy or when breastfeeding, do not take nonprescription, complementary/alternative medication(s) without the approval of provider ~ Nonprescription oral antihistamines (such as Benadryl, Allerest, Chlor-Trimetron, etc.) may help relieve symptoms. - Use as directed on  package label or by pharmacist. - Antihistamine medication may cause drowsiness and should be taken with caution by adults 75 years or older. Consider a non-sedating antihistamine such as Claritin now available without a prescription. ~ If an allergy is identified, tell all healthcare providers of your allergy. Even if a first-time reaction caused mild symptoms, a future response to the same allergen may cause more serious symptoms. Wear medical identification to alert others in case of an emergency. ~ Call EMS 911 if develop signs and symptoms of anaphylaxis within minutes to several hours of exposure: severe difficulty breathing; rapid, weak or irregular pulse; pruritus, urticaria, swelling of face, lips, tongue, or throat causing tightness or difficulty swallowing; abdominal cramping, nausea, vomiting or diarrhea. ~ 08/03/2010 10:07:30AM Page 1 of 1 CAN_[email or web address]_[email or web address]

## 2010-08-05 NOTE — Telephone Encounter (Signed)
Noted. 7/21 OV reviewed. Agree w/plan

## 2010-08-13 NOTE — H&P (Signed)
NAMEJAYCEE, [name] NO.:  [id number]  MEDICAL RECORD NO.:  [id number]  LOCATION:  1440                         FACILITY:  [facility]  PHYSICIAN:  Marinda Elk, M.D.DATE OF BIRTH:  05-09-1945  DATE OF ADMISSION:  07/20/2010 DATE OF DISCHARGE:  07/22/2010                             HISTORY & PHYSICAL   PRIMARY CARE DOCTOR:  Dr. Posey Rea.  CHIEF COMPLAINT:  Left extremity numbness.  HISTORY OF PRESENT ILLNESS:  This is a 65 year old with past medical history of hypertension.  Currently, on no medication, diagnosed about 2 years ago.  Trying to bring it down with exercise and diet.  Comes in for left leg numbness that started on Monday.  She relates she did not pay much attention to this, but describes that the pain and needle feeling in her left lower extremity.  She relates no weakness in this leg, but does relate that about this morning she started having left facial numbness.  Has had occasional headaches intermittently for the past week.  She did not pay much attention to it.  She said her blood pressure at home was really high.  She has not had any falls and syncopal episode.  No confusion, weakness, chest pain, shortness of breath, diarrhea, nausea, or vomiting.  So, we were asked to admit and further evaluate.  ALLERGIES:  No known drug allergies.  PAST MEDICAL HISTORY:  Hypertension, on medication.  She is on aspirin 81 mg and albuterol p.r.n.  SOCIAL HISTORY:  She denies tobacco or drugs, but she drinks alcohol occasionally.  FAMILY HISTORY:  Her father died of heart failure complications at 56. Her mother died of massive CVA with the age of 68.  She lives here in [place].  REVIEW OF SYSTEMS:  A 10-point review of systems done.  Pertinent positives per HPI.  PHYSICAL EXAMINATION:  VITAL SIGNS:  Temperature 98.9, heart rate of 88, blood pressure 181/78, repeated was 169/91.  She is breathing 17 times a minute, satting 96% on room  air.  GENERAL:  She is awake, alert, and oriented x3, very pleasantly female.  HEENT:  Anicteric, atraumatic. Pupils equally round, reactive to light.  Anicteric.  No jaundice.  No JVD.  No bruits.  Moist mucous membrane.  CARDIOVASCULAR:  She has regular rate and rhythm with positive S1, S2.  No murmurs, rubs, or gallops.  LUNGS: She has good air movement, clear to auscultation. ABDOMEN:  Positive bowel sounds, nontender, nondistended, soft. EXTREMITIES:  Positive pulses.  No clubbing, cyanosis, or edema.  SKIN: She had no rashes or ulcerations.  NEURO:  She is awake, alert, and oriented x4, coherent, and fluent language, III through XII are grossly intact except for mild left-sided facial droop, most noticeable when she smiles.  Muscle strength is 5/5 in all four extremities.  Deep tendon reflexes are 2+.  Sensation is intact throughout except on her face, on her left side, which she feels a little bit numb.  She also relates some numbness in the lateral part of the left foot like pins and needles. Deep tendon reflexes are 2+ bilaterally.  EKG is pending at the time of dictation.  Labs on admission shows  sodium 140, potassium 3.8, chloride 103, bicarb of 28, glucose of 93, BUN of 16, creatinine 0.8, bilirubin 0.4, alkaline phosphatase 52, AST 24, ALT 21, total protein 7.2, albumin 4.2, calcium 9.9.  First set of cardiac enzymes negative x1.  Her white count is 4.6, hemoglobin of 12.8, platelet count of 221, ANC of 2.0.  CT scan of the head showed no evidence of acute intracranial abnormalities.  There is cerebral volume loss appropriate for age.  No ventriculomegaly.  There is subventricular white matter and periventricular small vessel ischemic changes.  ASSESSMENT/PLAN: 1. Left foot numbness and left facial numbness, most likely a stroke.     We will get an MRI to confirm.  We will get a carotid Doppler, 2-D     echo, fasting lipid panel.  She failed aspirin as an outpatient, so      we will discontinue aspirin, start her on Plavix.  Get PT consult.     She presents swallowing evaluations, we will give her regular diet.     We will also check an EKG and UA for any kind of infections. 2. Hypertension.  Currently, on no meds.  A repeated blood pressure     was 169/91.  So, we will continue to monitor if needed.  We will     add a low-dose ACE, p.o.  We will like not to treat her blood     pressure unless it becomes greater than 200.  Has his normal     physiologic response.     Marinda Elk, M.D.     AF/MEDQ  D:  07/20/2010  T:  08/09/2010  Job:  [id number]  cc:   Georgina Quint. Plotnikov, MD [address] [place] [address]  Electronically Signed by Marinda Elk M.D. on 08/13/2010 07:02:33 AM

## 2010-08-23 ENCOUNTER — Other Ambulatory Visit: Payer: Self-pay | Admitting: Critical Care Medicine

## 2010-08-23 NOTE — Telephone Encounter (Signed)
Pt was last seen by PW on 08/23/09 and has no pending appts.  I called her home number, [phone], but received message stating she has a VM box that has not yet been set up.  I called her cell # - [phone] - [email or web address].  Pt needs to schedule f/u with PW and then will send refills to pharmacy to last until OV.

## 2010-08-26 ENCOUNTER — Telehealth: Payer: Self-pay | Admitting: Critical Care Medicine

## 2010-08-26 NOTE — Telephone Encounter (Signed)
[email or web address] to advise not showing where we tried to contact her

## 2010-08-27 ENCOUNTER — Other Ambulatory Visit: Payer: Self-pay | Admitting: Internal Medicine

## 2010-08-27 ENCOUNTER — Other Ambulatory Visit (INDEPENDENT_AMBULATORY_CARE_PROVIDER_SITE_OTHER): Payer: BC Managed Care – PPO

## 2010-08-27 DIAGNOSIS — G459 Transient cerebral ischemic attack, unspecified: Secondary | ICD-10-CM

## 2010-08-27 LAB — LIPID PANEL
Cholesterol: 247 mg/dL — ABNORMAL HIGH (ref 0–200)
HDL: 77.4 mg/dL (ref >39.00)
Total CHOL/HDL Ratio: 3
Triglycerides: 166 mg/dL — ABNORMAL HIGH (ref 0.0–149.0)
VLDL: 33.2 mg/dL (ref 0.0–40.0)

## 2010-08-27 LAB — COMPREHENSIVE METABOLIC PANEL
ALT: 20 U/L (ref 0–35)
AST: 18 U/L (ref 0–37)
Albumin: 4 g/dL (ref 3.5–5.2)
Alkaline Phosphatase: 48 U/L (ref 39–117)
BUN: 22 mg/dL (ref 6–23)
CO2: 31 mEq/L (ref 19–32)
Calcium: 9.1 mg/dL (ref 8.4–10.5)
Chloride: 103 mEq/L (ref 96–112)
Creatinine, Ser: 0.9 mg/dL (ref 0.4–1.2)
GFR: 67.18 mL/min (ref >60.00)
Glucose, Bld: 101 mg/dL — ABNORMAL HIGH (ref 70–99)
Potassium: 4.1 mEq/L (ref 3.5–5.1)
Sodium: 140 mEq/L (ref 135–145)
Total Bilirubin: 0.5 mg/dL (ref 0.3–1.2)
Total Protein: 6.9 g/dL (ref 6.0–8.3)

## 2010-08-27 LAB — LDL CHOLESTEROL, DIRECT: Direct LDL: 146.7 mg/dL

## 2010-08-27 NOTE — Telephone Encounter (Signed)
LMTCB

## 2010-08-28 NOTE — Telephone Encounter (Signed)
lmom x 2 that per our records no one from our office has tried to call her and if she needs [place] to call back. Also advised that she may need to call her PCP to see if they called her as well due to upcoming appointment with PCP.  Will sign off on this message and will to generate a new one if needed.

## 2010-08-29 NOTE — Telephone Encounter (Signed)
Called, spoke with pt.  She was informed she is due for follow up since it has been over 1 year since seen by PW.  She is requesting to schedule this in October or whenever the flu shots will be available because she is having no problems at this time.  Per TD, she is not sure when we will receive our supply of flu shots.  I informed pt the OCt schedule is not out at this time and we are not sure when we will have the flu shots available right now.  I suggested I will send 2 refills to pharmacy and she will need to call back in the next couple weeks to schedule f/u in October.  She is in agreement with this plan and verbalized understanding.

## 2010-09-02 ENCOUNTER — Ambulatory Visit (INDEPENDENT_AMBULATORY_CARE_PROVIDER_SITE_OTHER): Payer: BC Managed Care – PPO | Admitting: Internal Medicine

## 2010-09-02 ENCOUNTER — Encounter: Payer: Self-pay | Admitting: Internal Medicine

## 2010-09-02 DIAGNOSIS — I10 Essential (primary) hypertension: Secondary | ICD-10-CM

## 2010-09-02 DIAGNOSIS — I519 Heart disease, unspecified: Secondary | ICD-10-CM

## 2010-09-02 DIAGNOSIS — E785 Hyperlipidemia, unspecified: Secondary | ICD-10-CM

## 2010-09-02 MED ORDER — HYDROCHLOROTHIAZIDE 12.5 MG PO CAPS: 12.5000 mg | ORAL_CAPSULE | Freq: Every day | ORAL | Status: DC

## 2010-09-02 MED ORDER — OMEGA-3 FATTY ACIDS 1000 MG PO CAPS: 2.0000 g | ORAL_CAPSULE | Freq: Every day | ORAL | Status: DC

## 2010-09-02 NOTE — Assessment & Plan Note (Signed)
Restart HCTZ 

## 2010-09-02 NOTE — Assessment & Plan Note (Signed)
On Rx 

## 2010-09-02 NOTE — Progress Notes (Signed)
  Subjective:    Patient ID: [name], female    DOB: 07/26/1945, 65 y.o.   MRN: [id number]  HPI  F/u HTN - not taking any Rx now - there were side effects w/Hyzaar  Review of Systems  Constitutional: Negative for chills, activity change, appetite change, fatigue and unexpected weight change.  HENT: Negative for congestion, mouth sores and sinus pressure.   Eyes: Negative for visual disturbance.  Respiratory: Negative for cough and chest tightness.   Gastrointestinal: Negative for nausea and abdominal pain.  Genitourinary: Negative for frequency, difficulty urinating and vaginal pain.  Musculoskeletal: Negative for back pain and gait problem.  Skin: Negative for pallor and rash.  Neurological: Negative for dizziness, tremors, weakness, numbness and headaches.  Psychiatric/Behavioral: Negative for confusion and sleep disturbance. The patient is not nervous/anxious.        Objective:   Physical Exam  Constitutional: She appears well-developed and well-nourished. No distress.  HENT:  Head: Normocephalic.  Right Ear: External ear normal.  Left Ear: External ear normal.  Nose: Nose normal.  Mouth/Throat: Oropharynx is clear and moist.  Eyes: Conjunctivae are normal. Pupils are equal, round, and reactive to light. Right eye exhibits no discharge. Left eye exhibits no discharge.  Neck: Normal range of motion. Neck supple. No JVD present. No tracheal deviation present. No thyromegaly present.  Cardiovascular: Normal rate, regular rhythm and normal heart sounds.   Pulmonary/Chest: No stridor. No respiratory distress. She has no wheezes.  Abdominal: Soft. Bowel sounds are normal. She exhibits no distension and no mass. There is no tenderness. There is no rebound and no guarding.  Musculoskeletal: She exhibits no edema and no tenderness.  Lymphadenopathy:    She has no cervical adenopathy.  Neurological: She displays normal reflexes. No cranial nerve deficit. She exhibits normal muscle  tone. Coordination normal.  Skin: No rash noted. No erythema.  Psychiatric: She has a normal mood and affect. Her behavior is normal. Judgment and thought content normal.          Assessment & Plan:

## 2010-09-02 NOTE — Assessment & Plan Note (Signed)
Better diet  

## 2010-10-06 ENCOUNTER — Other Ambulatory Visit: Payer: Self-pay | Admitting: Internal Medicine

## 2010-10-17 ENCOUNTER — Ambulatory Visit (INDEPENDENT_AMBULATORY_CARE_PROVIDER_SITE_OTHER): Payer: BC Managed Care – PPO | Admitting: *Deleted

## 2010-10-17 DIAGNOSIS — Z23 Encounter for immunization: Secondary | ICD-10-CM

## 2010-11-01 ENCOUNTER — Other Ambulatory Visit (INDEPENDENT_AMBULATORY_CARE_PROVIDER_SITE_OTHER): Payer: BC Managed Care – PPO

## 2010-11-01 DIAGNOSIS — I10 Essential (primary) hypertension: Secondary | ICD-10-CM

## 2010-11-01 LAB — BASIC METABOLIC PANEL WITH GFR
BUN: 22 mg/dL (ref 6–23)
CO2: 27 meq/L (ref 19–32)
Calcium: 8.8 mg/dL (ref 8.4–10.5)
Chloride: 95 meq/L — ABNORMAL LOW (ref 96–112)
Creatinine, Ser: 0.9 mg/dL (ref 0.4–1.2)
GFR: 65.46 mL/min
Glucose, Bld: 114 mg/dL — ABNORMAL HIGH (ref 70–99)
Potassium: 4.3 meq/L (ref 3.5–5.1)
Sodium: 131 meq/L — ABNORMAL LOW (ref 135–145)

## 2010-11-07 ENCOUNTER — Encounter: Payer: Self-pay | Admitting: Internal Medicine

## 2010-11-07 ENCOUNTER — Ambulatory Visit (INDEPENDENT_AMBULATORY_CARE_PROVIDER_SITE_OTHER): Payer: BC Managed Care – PPO | Admitting: Internal Medicine

## 2010-11-07 VITALS — BP 150/88 | HR 80 | Temp 98.3°F | Resp 16 | Wt 123.0 lb

## 2010-11-07 DIAGNOSIS — R739 Hyperglycemia, unspecified: Secondary | ICD-10-CM

## 2010-11-07 DIAGNOSIS — I10 Essential (primary) hypertension: Secondary | ICD-10-CM

## 2010-11-07 DIAGNOSIS — Z1211 Encounter for screening for malignant neoplasm of colon: Secondary | ICD-10-CM

## 2010-11-07 DIAGNOSIS — G459 Transient cerebral ischemic attack, unspecified: Secondary | ICD-10-CM

## 2010-11-07 DIAGNOSIS — R7309 Other abnormal glucose: Secondary | ICD-10-CM

## 2010-11-07 MED ORDER — SPIRONOLACTONE-HCTZ 25-25 MG PO TABS: 1.0000 | ORAL_TABLET | Freq: Every day | ORAL | Status: DC

## 2010-11-07 NOTE — Assessment & Plan Note (Signed)
Not better. D/c HCTZ Start spiron/HCTZ

## 2010-11-07 NOTE — Assessment & Plan Note (Signed)
Due to HTN crisis - L facial paresthesia. No recurrence.  07/2010

## 2010-11-07 NOTE — Progress Notes (Signed)
  Subjective:    Patient ID: [name], female    DOB: October 11, 1945, 65 y.o.   MRN: [id number]  HPI F/u HTN, TIA She was dizzy on Losartan  BP Readings from Last 3 Encounters:  11/07/10 150/88  09/02/10 140/72  08/04/10 158/70     Review of Systems  Constitutional: Negative for chills, activity change, appetite change, fatigue and unexpected weight change.  HENT: Negative for congestion, mouth sores and sinus pressure.   Eyes: Negative for visual disturbance.  Respiratory: Negative for cough and chest tightness.   Gastrointestinal: Negative for nausea and abdominal pain.  Genitourinary: Negative for frequency, difficulty urinating and vaginal pain.  Musculoskeletal: Negative for back pain and gait problem.  Skin: Negative for pallor and rash.  Neurological: Negative for dizziness, tremors, weakness, numbness and headaches.  Psychiatric/Behavioral: Negative for confusion and sleep disturbance.   Wt Readings from Last 3 Encounters:  11/07/10 123 lb (55.792 kg)  09/02/10 120 lb (54.432 kg)  08/03/10 117 lb (53.071 kg)       Objective:   Physical Exam  Constitutional: She appears well-developed and well-nourished. No distress.  HENT:  Head: Normocephalic.  Right Ear: External ear normal.  Left Ear: External ear normal.  Nose: Nose normal.  Mouth/Throat: Oropharynx is clear and moist.  Eyes: Conjunctivae are normal. Pupils are equal, round, and reactive to light. Right eye exhibits no discharge. Left eye exhibits no discharge.  Neck: Normal range of motion. Neck supple. No JVD present. No tracheal deviation present. No thyromegaly present.  Cardiovascular: Normal rate, regular rhythm and normal heart sounds.   Pulmonary/Chest: No stridor. No respiratory distress. She has no wheezes.  Abdominal: Soft. Bowel sounds are normal. She exhibits no distension and no mass. There is no tenderness. There is no rebound and no guarding.  Musculoskeletal: She exhibits no edema and no  tenderness.  Lymphadenopathy:    She has no cervical adenopathy.  Neurological: She displays normal reflexes. No cranial nerve deficit. She exhibits normal muscle tone. Coordination normal.  Skin: No rash noted. No erythema.  Psychiatric: She has a normal mood and affect. Her behavior is normal. Judgment and thought content normal.   Lab Results  Component Value Date   WBC 4.6 07/20/2010   HGB 12.8 07/20/2010   HCT 37.0 07/20/2010   PLT 221 07/20/2010   GLUCOSE 114* 11/01/2010   CHOL 247* 08/27/2010   TRIG 166.0* 08/27/2010   HDL 77.40 08/27/2010   LDLDIRECT 146.7 08/27/2010   LDLCALC 113* 07/21/2010   ALT 20 08/27/2010   AST 18 08/27/2010   NA 131* 11/01/2010   K 4.3 11/01/2010   CL 95* 11/01/2010   CREATININE 0.9 11/01/2010   BUN 22 11/01/2010   CO2 27 11/01/2010   TSH 2.525 07/21/2010   INR 0.95 07/20/2010   HGBA1C 5.9* 07/20/2010          Assessment & Plan:

## 2010-11-15 ENCOUNTER — Other Ambulatory Visit: Payer: Self-pay | Admitting: Critical Care Medicine

## 2010-11-15 NOTE — Telephone Encounter (Signed)
Called pt's home number x 2 - [phone] - but both times the call was disconnected. Called pt's cell number - [phone] - [email or web address]   Pt was last seen by PW on 08/2009 and has no pending appts.

## 2010-11-18 ENCOUNTER — Encounter: Payer: Self-pay | Admitting: Internal Medicine

## 2010-11-18 NOTE — Telephone Encounter (Signed)
Spoke with pt.  OV scheduled with PW on 12/13/10 at 2:45 -- pt aware and aware qvar rx sent to pharmacy.

## 2010-12-10 ENCOUNTER — Ambulatory Visit (AMBULATORY_SURGERY_CENTER): Payer: BC Managed Care – PPO | Admitting: *Deleted

## 2010-12-10 VITALS — Ht 61.0 in | Wt 125.0 lb

## 2010-12-10 DIAGNOSIS — Z1211 Encounter for screening for malignant neoplasm of colon: Secondary | ICD-10-CM

## 2010-12-10 MED ORDER — PEG-KCL-NACL-NASULF-NA ASC-C 100 G PO SOLR: ORAL | Status: DC

## 2010-12-13 ENCOUNTER — Encounter: Payer: Self-pay | Admitting: Critical Care Medicine

## 2010-12-13 ENCOUNTER — Ambulatory Visit (INDEPENDENT_AMBULATORY_CARE_PROVIDER_SITE_OTHER): Payer: BC Managed Care – PPO | Admitting: Critical Care Medicine

## 2010-12-13 DIAGNOSIS — J45909 Unspecified asthma, uncomplicated: Secondary | ICD-10-CM

## 2010-12-13 MED ORDER — PREDNISONE 10 MG PO TABS: ORAL_TABLET | ORAL | Status: DC

## 2010-12-13 MED ORDER — PIRBUTEROL ACETATE 200 MCG/INH IN AERB
2.0000 | INHALATION_SPRAY | Freq: Four times a day (QID) | RESPIRATORY_TRACT | Status: DC | PRN
Start: 2010-12-13 — End: 2013-05-13

## 2010-12-13 MED ORDER — BECLOMETHASONE DIPROPIONATE 80 MCG/ACT IN AERS: 2.0000 | INHALATION_SPRAY | Freq: Every day | RESPIRATORY_TRACT | Status: DC

## 2010-12-13 MED ORDER — AZITHROMYCIN 250 MG PO TABS: 250.0000 mg | ORAL_TABLET | Freq: Every day | ORAL | Status: AC

## 2010-12-13 MED ORDER — FLUTICASONE FUROATE 27.5 MCG/SPRAY NA SUSP: 2.0000 | Freq: Every day | NASAL | Status: DC

## 2010-12-13 NOTE — Progress Notes (Signed)
Subjective:    Patient ID: [name], female    DOB: 12-Sep-1945, 65 y.o.   MRN: [id number]  HPI 65 y.o. WF with moderate persistent asthma    12/13/2010 Over the past ok,  Notes some pndrip and mucus is yellow, noted for three days. Throat is sore.    No real change in dyspnea. Had flu vaccine    Review of Systems Constitutional:   No  weight loss, night sweats,  Fevers, chills, fatigue, lassitude. HEENT:   No headaches,  Difficulty swallowing,  Tooth/dental problems,  Sore throat,                No sneezing, itching, ear ache, nasal congestion, post nasal drip,   CV:  No chest pain,  Orthopnea, PND, swelling in lower extremities, anasarca, dizziness, palpitations  GI  No heartburn, indigestion, abdominal pain, nausea, vomiting, diarrhea, change in bowel habits, loss of appetite  Resp: No shortness of breath with exertion or at rest.  No excess mucus, no productive cough,  No non-productive cough,  No coughing up of blood.  No change in color of mucus.  No wheezing.  No chest wall deformity  Skin: no rash or lesions.  GU: no dysuria, change in color of urine, no urgency or frequency.  No flank pain.  MS:  No joint pain or swelling.  No decreased range of motion.  No back pain.  Psych:  No change in mood or affect. No depression or anxiety.  No memory loss.     Objective:   Physical Exam  Filed Vitals:   12/13/10 1455  BP: 132/72  Pulse: 92  Temp: 98.5 F (36.9 C)  TempSrc: Oral  Height: 5\' 1"  (1.549 m)  Weight: 126 lb 12.8 oz (57.516 kg)  SpO2: 98%    Gen: Pleasant, well-nourished, in no distress,  normal affect  ENT: No lesions,  mouth clear,  oropharynx clear, no postnasal drip  Neck: No JVD, no TMG, no carotid bruits  Lungs: No use of accessory muscles, no dullness to percussion, clear without rales or rhonchi  Cardiovascular: RRR, heart sounds normal, no murmur or gallops, no peripheral edema  Abdomen: soft and NT, no HSM,  BS  normal  Musculoskeletal: No deformities, no cyanosis or clubbing  Neuro: alert, non focal  Skin: Warm, no lesions or rashes  No results found.       Assessment & Plan:   ASTHMA Moderate persistent asthma with allergic rhinitis flare Plan Trial veramyst Cont ics Pred/zpak given for upcoming trip    Updated Medication List Outpatient Encounter Prescriptions as of 12/13/2010  Medication Sig Dispense Refill  . aspirin 81 MG tablet Take 81 mg by mouth daily.        . beclomethasone (QVAR) 80 MCG/ACT inhaler Inhale 2 puffs into the lungs daily.  8.7 g  11  . calcium carbonate (OS-CAL) 600 MG TABS Take 600 mg by mouth daily.        . cetirizine (ZYRTEC) 10 MG tablet Take 10 mg by mouth daily.        . cyclobenzaprine (FLEXERIL) 10 MG tablet Take 10 mg by mouth as needed.      Marland Kitchen estradiol (ESTRACE) 0.1 MG/GM vaginal cream Place 2 g vaginally 2 (two) times a week.        . fish oil-omega-3 fatty acids 1000 MG capsule Take 2 capsules (2 g total) by mouth daily.  100 capsule  3  . meloxicam (MOBIC) 7.5 MG tablet Take 1  tablet by mouth as needed. Back papin      . naproxen sodium (ANAPROX) 220 MG tablet Take 220 mg by mouth as needed.        . Pediatric Multiple Vit-C-FA (MULTIVITAMIN ANIMAL SHAPES, WITH CA/FA,) WITH C & FA CHEW Chew 1 tablet by mouth daily.        . pirbuterol (MAXAIR) 200 MCG/INH inhaler Inhale 2 puffs into the lungs 4 (four) times daily as needed.  1 Inhaler  6  . spironolactone-hydrochlorothiazide (ALDACTAZIDE) 25-25 MG per tablet Take 1 tablet by mouth daily.  30 tablet  11  . traMADol (ULTRAM) 50 MG tablet Take 1 tablet by mouth as needed. pain      . DISCONTD: pirbuterol (MAXAIR) 200 MCG/INH inhaler Inhale 2 puffs into the lungs as needed.        Marland Kitchen DISCONTD: QVAR 80 MCG/ACT inhaler INHALE 2 PUFF TWICE DAILY  8.7 g  0  . azithromycin (ZITHROMAX) 250 MG tablet Take 1 tablet (250 mg total) by mouth daily. Take two once then one daily until gone  6 each  0  .  fluticasone (VERAMYST) 27.5 MCG/SPRAY nasal spray Place 2 sprays into the nose daily.  10 g  0  . predniSONE (DELTASONE) 10 MG tablet Take 4 for two days three for two days two for two days one for two days   20 tablet  0  . DISCONTD: cholecalciferol (VITAMIN D) 1000 UNITS tablet Take 1 tablet (1,000 Units total) by mouth daily.  200 tablet  3  . DISCONTD: peg 3350 powder (MOVIPREP) 100 G SOLR MOVI PREP take as directed  1 kit  0

## 2010-12-13 NOTE — Patient Instructions (Signed)
Given for trip: Prednisone 10mg  Take 4 for two days three for two days two for two days one for two days Azithromycin 250mg  Take two once then one daily until gone  Refill on Qvar and Maxair  Take veramyst until gone two sprays each nostril daily Return 1 year or as needed

## 2010-12-15 NOTE — Assessment & Plan Note (Signed)
Moderate persistent asthma with allergic rhinitis flare Plan Trial veramyst Cont ics Pred/zpak given for upcoming trip

## 2010-12-24 ENCOUNTER — Encounter: Payer: Self-pay | Admitting: Internal Medicine

## 2010-12-24 ENCOUNTER — Ambulatory Visit (AMBULATORY_SURGERY_CENTER): Payer: BC Managed Care – PPO | Admitting: Internal Medicine

## 2010-12-24 VITALS — BP 150/76 | HR 81 | Temp 98.6°F | Resp 16 | Ht 61.0 in | Wt 125.0 lb

## 2010-12-24 DIAGNOSIS — K6389 Other specified diseases of intestine: Secondary | ICD-10-CM

## 2010-12-24 DIAGNOSIS — Z1211 Encounter for screening for malignant neoplasm of colon: Secondary | ICD-10-CM

## 2010-12-24 MED ORDER — SODIUM CHLORIDE 0.9 % IV SOLN: 500.0000 mL | INTRAVENOUS | Status: DC

## 2010-12-24 NOTE — Op Note (Signed)
[facility] [address] [place], [place]  [address]  COLONOSCOPY PROCEDURE REPORT  PATIENT:  [name], [name]  MR#:  [id number] BIRTHDATE:  01/24/1945, 65 yrs. old  GENDER:  female ENDOSCOPIST:  Hedwig Morton. Juanda Chance, MD REF. BY:  Linda Hedges. Plotnikov, M.D. PROCEDURE DATE:  12/24/2010 PROCEDURE:  Colonoscopy [address]84[address] ASA CLASS:  Class II INDICATIONS:  colorectal cancer screening, average risk last colon in [place] about 10 years ago MEDICATIONS:   These medications were titrated to patient response per physician's verbal order, Versed 10 mg, Fentanyl 100 mcg, Benadryl 25 mg  DESCRIPTION OF PROCEDURE:   After the risks and benefits and of the procedure were explained, informed consent was obtained. Digital rectal exam was performed and revealed no rectal masses. The LB [id number] endoscope was introduced through the anus and advanced to the cecum, which was identified by both the appendix and ileocecal valve.  The quality of the prep was good, using MoviPrep.  The instrument was then slowly withdrawn as the colon was fully examined. <<PROCEDUREIMAGES>>  FINDINGS:  Mild diverticulosis was found in the sigmoid colon (see image3).  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  This was otherwise a normal examination of the colon (see image2, image3, and image4).   Retroflexed views in the rectum revealed no abnormalities.    The scope was then withdrawn from the patient and the procedure completed.  COMPLICATIONS:  None ENDOSCOPIC IMPRESSION: 1) Mild diverticulosis in the sigmoid colon 2) Melanosis 3) Otherwise normal examination RECOMMENDATIONS: 1) High fiber diet.  REPEAT EXAM:  In 10 year(s) for.  ______________________________ Hedwig Morton. Juanda Chance, MD  CC:  n. eSIGNED:   Hedwig Morton. Onofre Gains at 12/24/2010 10:53 AM  Burnett Harry, [phone]

## 2010-12-24 NOTE — Progress Notes (Signed)
Patient did not experience any of the following events: a burn prior to discharge; a fall within the facility; wrong site/side/patient/procedure/implant event; or a hospital transfer or hospital admission upon discharge from the facility. (G8907) Patient did not have preoperative order for IV antibiotic SSI prophylaxis. (G8918)  

## 2010-12-24 NOTE — Patient Instructions (Signed)
Please refer to your blue and neon green sheets for instructions regarding diet and activity for the rest of today.  You may resume your medications as you would normally take them.   Diverticulosis Diverticulosis is a common condition that develops when small pouches (diverticula) form in the wall of the colon. The risk of diverticulosis increases with age. It happens more often in people who eat a low-fiber diet. Most individuals with diverticulosis have no symptoms. Those individuals with symptoms usually experience abdominal pain, constipation, or loose stools (diarrhea). HOME CARE INSTRUCTIONS   Increase the amount of fiber in your diet as directed by your caregiver or dietician. This may reduce symptoms of diverticulosis.   Your caregiver may recommend taking a dietary fiber supplement.   Drink at least 6 to 8 glasses of water each day to prevent constipation.   Try not to strain when you have a bowel movement.   Your caregiver may recommend avoiding nuts and seeds to prevent complications, although this is still an uncertain benefit.   Only take over-the-counter or prescription medicines for pain, discomfort, or fever as directed by your caregiver.  FOODS WITH HIGH FIBER CONTENT INCLUDE:  Fruits. Apple, peach, pear, tangerine, raisins, prunes.   Vegetables. Brussels sprouts, asparagus, broccoli, cabbage, carrot, cauliflower, romaine lettuce, spinach, summer squash, tomato, winter squash, zucchini.   Starchy Vegetables. Baked beans, kidney beans, lima beans, split peas, lentils, potatoes (with skin).   Grains. Whole wheat bread, brown rice, bran flake cereal, plain oatmeal, white rice, shredded wheat, bran muffins.  SEEK IMMEDIATE MEDICAL CARE IF:   You develop increasing pain or severe bloating.   You have an oral temperature above 102 F (38.9 C), not controlled by medicine.   You develop vomiting or bowel movements that are bloody or black.  Document Released: 09/27/2003  Document Revised: 09/11/2010 Document Reviewed: 05/30/2009 [employer] Patient Information 2012 [employer]. 

## 2010-12-25 ENCOUNTER — Telehealth: Payer: Self-pay | Admitting: *Deleted

## 2010-12-25 NOTE — Telephone Encounter (Signed)
No answer, message left

## 2011-01-31 ENCOUNTER — Ambulatory Visit: Payer: BC Managed Care – PPO | Admitting: Internal Medicine

## 2011-02-17 ENCOUNTER — Ambulatory Visit: Payer: Self-pay | Admitting: Internal Medicine

## 2011-02-18 ENCOUNTER — Telehealth: Payer: Self-pay | Admitting: Internal Medicine

## 2011-02-18 ENCOUNTER — Other Ambulatory Visit (INDEPENDENT_AMBULATORY_CARE_PROVIDER_SITE_OTHER): Payer: BC Managed Care – PPO

## 2011-02-18 DIAGNOSIS — R7309 Other abnormal glucose: Secondary | ICD-10-CM

## 2011-02-18 DIAGNOSIS — R739 Hyperglycemia, unspecified: Secondary | ICD-10-CM

## 2011-02-18 LAB — HEMOGLOBIN A1C: Hgb A1c MFr Bld: 5.9 % (ref 4.6–6.5)

## 2011-02-18 LAB — BASIC METABOLIC PANEL
BUN: 20 mg/dL (ref 6–23)
CO2: 27 mEq/L (ref 19–32)
Calcium: 8.8 mg/dL (ref 8.4–10.5)
Chloride: 90 mEq/L — ABNORMAL LOW (ref 96–112)
Creatinine, Ser: 1 mg/dL (ref 0.4–1.2)
GFR: 57.4 mL/min — ABNORMAL LOW (ref >60.00)
Glucose, Bld: 96 mg/dL (ref 70–99)
Potassium: 4.6 mEq/L (ref 3.5–5.1)
Sodium: 124 mEq/L — ABNORMAL LOW (ref 135–145)

## 2011-02-18 NOTE — Telephone Encounter (Signed)
[name], please, inform patient that all labs are normal except for low sodium. Is she drinking too much water? If yes - cut back please Keep rov  Thx

## 2011-02-19 NOTE — Telephone Encounter (Signed)
Left mess for patient to call back.  

## 2011-02-19 NOTE — Telephone Encounter (Signed)
Pt informed

## 2011-02-25 ENCOUNTER — Encounter: Payer: Self-pay | Admitting: Internal Medicine

## 2011-02-25 ENCOUNTER — Ambulatory Visit (INDEPENDENT_AMBULATORY_CARE_PROVIDER_SITE_OTHER): Payer: BC Managed Care – PPO | Admitting: Internal Medicine

## 2011-02-25 VITALS — BP 138/88 | HR 80 | Temp 99.1°F | Resp 16 | Wt 125.1 lb

## 2011-02-25 DIAGNOSIS — J45909 Unspecified asthma, uncomplicated: Secondary | ICD-10-CM

## 2011-02-25 DIAGNOSIS — E871 Hypo-osmolality and hyponatremia: Secondary | ICD-10-CM | POA: Insufficient documentation

## 2011-02-25 DIAGNOSIS — I10 Essential (primary) hypertension: Secondary | ICD-10-CM

## 2011-02-25 DIAGNOSIS — G459 Transient cerebral ischemic attack, unspecified: Secondary | ICD-10-CM

## 2011-02-25 HISTORY — DX: Hypo-osmolality and hyponatremia: E87.1

## 2011-02-25 MED ORDER — OLMESARTAN-AMLODIPINE-HCTZ 20-5-12.5 MG PO TABS: 1.0000 | ORAL_TABLET | ORAL | Status: DC

## 2011-02-25 NOTE — Assessment & Plan Note (Signed)
Better -- see med change

## 2011-02-25 NOTE — Assessment & Plan Note (Signed)
Continue with current prescription therapy as reflected on the Med list.  

## 2011-02-25 NOTE — Assessment & Plan Note (Signed)
2/13 due to Rx: aldactazide -- d/c'd

## 2011-02-25 NOTE — Progress Notes (Signed)
  Subjective:    Patient ID: [name], female    DOB: [birth year], 66 y.o.   MRN: [id number]  HPI  F/u TIA, HTN, abn Na on labs SBP 130s at home   BP Readings from Last 3 Encounters:  02/25/11 138/88  12/24/10 150/76  12/13/10 132/72    Review of Systems  Constitutional: Negative.  Negative for fever, chills, diaphoresis, activity change, appetite change, fatigue and unexpected weight change.  HENT: Negative for hearing loss, ear pain, nosebleeds, congestion, sore throat, facial swelling, rhinorrhea, sneezing, mouth sores, trouble swallowing, neck pain, neck stiffness, postnasal drip, sinus pressure and tinnitus.   Eyes: Negative for pain, discharge, redness, itching and visual disturbance.  Respiratory: Negative for cough, chest tightness, shortness of breath, wheezing and stridor.   Cardiovascular: Negative for chest pain, palpitations and leg swelling.  Gastrointestinal: Negative for nausea, diarrhea, constipation, blood in stool, abdominal distention, anal bleeding and rectal pain.  Genitourinary: Negative for dysuria, urgency, frequency, hematuria, flank pain, vaginal bleeding, vaginal discharge, difficulty urinating, genital sores and pelvic pain.  Musculoskeletal: Negative for back pain, joint swelling, arthralgias and gait problem.  Skin: Negative.  Negative for rash.  Neurological: Negative for dizziness, tremors, seizures, syncope, speech difficulty, weakness, numbness and headaches.  Hematological: Negative for adenopathy. Does not bruise/bleed easily.  Psychiatric/Behavioral: Negative for suicidal ideas, behavioral problems, sleep disturbance, dysphoric mood and decreased concentration. The patient is not nervous/anxious.        Objective:   Physical Exam  Constitutional: She appears well-developed. No distress.  HENT:  Head: Normocephalic.  Right Ear: External ear normal.  Left Ear: External ear normal.  Nose: Nose normal.  Mouth/Throat: Oropharynx is clear and  moist.  Eyes: Conjunctivae are normal. Pupils are equal, round, and reactive to light. Right eye exhibits no discharge. Left eye exhibits no discharge.  Neck: Normal range of motion. Neck supple. No JVD present. No tracheal deviation present. No thyromegaly present.  Cardiovascular: Normal rate, regular rhythm and normal heart sounds.   Pulmonary/Chest: No stridor. No respiratory distress. She has no wheezes.  Abdominal: Soft. Bowel sounds are normal. She exhibits no distension and no mass. There is no tenderness. There is no rebound and no guarding.  Musculoskeletal: She exhibits no edema and no tenderness.  Lymphadenopathy:    She has no cervical adenopathy.  Neurological: She displays normal reflexes. No cranial nerve deficit. She exhibits normal muscle tone. Coordination normal.  Skin: No rash noted. No erythema.  Psychiatric: She has a normal mood and affect. Her behavior is normal. Judgment and thought content normal.   Lab Results  Component Value Date   WBC 4.6 07/20/2010   HGB 12.8 07/20/2010   HCT 37.0 07/20/2010   PLT 221 07/20/2010   GLUCOSE 96 02/18/2011   CHOL 247* 08/27/2010   TRIG 166.0* 08/27/2010   HDL 77.40 08/27/2010   LDLDIRECT 146.7 08/27/2010   LDLCALC 113* 07/21/2010   ALT 20 08/27/2010   AST 18 08/27/2010   NA 124* 02/18/2011   K 4.6 02/18/2011   CL 90* 02/18/2011   CREATININE 1.0 02/18/2011   BUN 20 02/18/2011   CO2 27 02/18/2011   TSH 2.525 07/21/2010   INR 0.95 07/20/2010   HGBA1C 5.9 02/18/2011          Assessment & Plan:

## 2011-03-28 ENCOUNTER — Telehealth: Payer: Self-pay | Admitting: Internal Medicine

## 2011-03-28 ENCOUNTER — Other Ambulatory Visit (INDEPENDENT_AMBULATORY_CARE_PROVIDER_SITE_OTHER): Payer: BC Managed Care – PPO

## 2011-03-28 DIAGNOSIS — E871 Hypo-osmolality and hyponatremia: Secondary | ICD-10-CM

## 2011-03-28 LAB — BASIC METABOLIC PANEL
BUN: 30 mg/dL — ABNORMAL HIGH (ref 6–23)
CO2: 25 mEq/L (ref 19–32)
Calcium: 9.3 mg/dL (ref 8.4–10.5)
Chloride: 95 mEq/L — ABNORMAL LOW (ref 96–112)
Creatinine, Ser: 1.3 mg/dL — ABNORMAL HIGH (ref 0.4–1.2)
GFR: 44.26 mL/min — ABNORMAL LOW (ref >60.00)
Glucose, Bld: 102 mg/dL — ABNORMAL HIGH (ref 70–99)
Potassium: 4.6 mEq/L (ref 3.5–5.1)
Sodium: 128 mEq/L — ABNORMAL LOW (ref 135–145)

## 2011-03-28 NOTE — Telephone Encounter (Signed)
[name], please, inform patient that her sodium is better thx

## 2011-03-28 NOTE — Telephone Encounter (Signed)
Called pt- no answer/machine. 

## 2011-03-31 NOTE — Telephone Encounter (Signed)
Left detailed mess informing pt of below.  

## 2011-04-03 ENCOUNTER — Ambulatory Visit: Payer: BC Managed Care – PPO | Admitting: Internal Medicine

## 2011-04-03 DIAGNOSIS — Z0289 Encounter for other administrative examinations: Secondary | ICD-10-CM

## 2011-04-04 ENCOUNTER — Ambulatory Visit: Payer: BC Managed Care – PPO | Admitting: Internal Medicine

## 2011-04-21 ENCOUNTER — Ambulatory Visit (INDEPENDENT_AMBULATORY_CARE_PROVIDER_SITE_OTHER): Payer: BC Managed Care – PPO | Admitting: Internal Medicine

## 2011-04-21 ENCOUNTER — Encounter: Payer: Self-pay | Admitting: Internal Medicine

## 2011-04-21 VITALS — BP 140/82 | HR 90 | Temp 98.1°F | Wt 124.0 lb

## 2011-04-21 DIAGNOSIS — I10 Essential (primary) hypertension: Secondary | ICD-10-CM

## 2011-04-21 DIAGNOSIS — E871 Hypo-osmolality and hyponatremia: Secondary | ICD-10-CM

## 2011-04-21 DIAGNOSIS — J45909 Unspecified asthma, uncomplicated: Secondary | ICD-10-CM

## 2011-04-21 DIAGNOSIS — M8580 Other specified disorders of bone density and structure, unspecified site: Secondary | ICD-10-CM

## 2011-04-21 DIAGNOSIS — M899 Disorder of bone, unspecified: Secondary | ICD-10-CM

## 2011-04-21 DIAGNOSIS — Z7184 Encounter for health counseling related to travel: Secondary | ICD-10-CM | POA: Insufficient documentation

## 2011-04-21 DIAGNOSIS — Z789 Other specified health status: Secondary | ICD-10-CM

## 2011-04-21 MED ORDER — PROMETHAZINE HCL 25 MG PO TABS: 25.0000 mg | ORAL_TABLET | Freq: Three times a day (TID) | ORAL | Status: DC | PRN

## 2011-04-21 MED ORDER — CIPROFLOXACIN HCL 500 MG PO TABS: 500.0000 mg | ORAL_TABLET | Freq: Two times a day (BID) | ORAL | Status: AC

## 2011-04-21 NOTE — Assessment & Plan Note (Signed)
Doing well on Benicar HCT

## 2011-04-21 NOTE — Progress Notes (Signed)
Patient ID: [name], female   DOB: [birth year], 66 y.o.   MRN: [id number]  Subjective:    Patient ID: [name], female    DOB: [birth year], 66 y.o.   MRN: [id number]  HPI  F/u TIA, HTN, abn Na on labs SBP 120-130s at home   BP Readings from Last 3 Encounters:  04/21/11 140/82  02/25/11 138/88  12/24/10 150/76   Wt Readings from Last 3 Encounters:  04/21/11 124 lb (56.246 kg)  02/25/11 125 lb 1.9 oz (56.754 kg)  12/24/10 125 lb (56.7 kg)     Review of Systems  Constitutional: Negative.  Negative for fever, chills, diaphoresis, activity change, appetite change, fatigue and unexpected weight change.  HENT: Negative for hearing loss, ear pain, nosebleeds, congestion, sore throat, facial swelling, rhinorrhea, sneezing, mouth sores, trouble swallowing, neck pain, neck stiffness, postnasal drip, sinus pressure and tinnitus.   Eyes: Negative for pain, discharge, redness, itching and visual disturbance.  Respiratory: Negative for cough, chest tightness, shortness of breath, wheezing and stridor.   Cardiovascular: Negative for chest pain, palpitations and leg swelling.  Gastrointestinal: Negative for nausea, diarrhea, constipation, blood in stool, abdominal distention, anal bleeding and rectal pain.  Genitourinary: Negative for dysuria, urgency, frequency, hematuria, flank pain, vaginal bleeding, vaginal discharge, difficulty urinating, genital sores and pelvic pain.  Musculoskeletal: Negative for back pain, joint swelling, arthralgias and gait problem.  Skin: Negative.  Negative for rash.  Neurological: Negative for dizziness, tremors, seizures, syncope, speech difficulty, weakness, numbness and headaches.  Hematological: Negative for adenopathy. Does not bruise/bleed easily.  Psychiatric/Behavioral: Negative for suicidal ideas, behavioral problems, sleep disturbance, dysphoric mood and decreased concentration. The patient is not nervous/anxious.        Objective:   Physical Exam   Constitutional: She appears well-developed. No distress.  HENT:  Head: Normocephalic.  Right Ear: External ear normal.  Left Ear: External ear normal.  Nose: Nose normal.  Mouth/Throat: Oropharynx is clear and moist.  Eyes: Conjunctivae are normal. Pupils are equal, round, and reactive to light. Right eye exhibits no discharge. Left eye exhibits no discharge.  Neck: Normal range of motion. Neck supple. No JVD present. No tracheal deviation present. No thyromegaly present.  Cardiovascular: Normal rate, regular rhythm and normal heart sounds.   Pulmonary/Chest: No stridor. No respiratory distress. She has no wheezes.  Abdominal: Soft. Bowel sounds are normal. She exhibits no distension and no mass. There is no tenderness. There is no rebound and no guarding.  Musculoskeletal: She exhibits no edema and no tenderness.  Lymphadenopathy:    She has no cervical adenopathy.  Neurological: She displays normal reflexes. No cranial nerve deficit. She exhibits normal muscle tone. Coordination normal.  Skin: No rash noted. No erythema.  Psychiatric: She has a normal mood and affect. Her behavior is normal. Judgment and thought content normal.   Lab Results  Component Value Date   WBC 4.6 07/20/2010   HGB 12.8 07/20/2010   HCT 37.0 07/20/2010   PLT 221 07/20/2010   GLUCOSE 102* 03/28/2011   CHOL 247* 08/27/2010   TRIG 166.0* 08/27/2010   HDL 77.40 08/27/2010   LDLDIRECT 146.7 08/27/2010   LDLCALC 113* 07/21/2010   ALT 20 08/27/2010   AST 18 08/27/2010   NA 128* 03/28/2011   K 4.6 03/28/2011   CL 95* 03/28/2011   CREATININE 1.3* 03/28/2011   BUN 30* 03/28/2011   CO2 25 03/28/2011   TSH 2.525 07/21/2010   INR 0.95 07/20/2010   HGBA1C 5.9 02/18/2011  Assessment & Plan:

## 2011-04-21 NOTE — Assessment & Plan Note (Signed)
Continue with current prescription therapy as reflected on the Med list.  

## 2011-04-21 NOTE — Assessment & Plan Note (Signed)
Recheck in 3-4 wks

## 2011-04-21 NOTE — Assessment & Plan Note (Signed)
6/13 [place] Discussed  Given Rx: Cipro Promethazine

## 2011-05-02 ENCOUNTER — Ambulatory Visit (INDEPENDENT_AMBULATORY_CARE_PROVIDER_SITE_OTHER)
Admission: RE | Admit: 2011-05-02 | Discharge: 2011-05-02 | Disposition: A | Discharge status: Home / Self Care | Payer: BC Managed Care – PPO | Source: Ambulatory Visit

## 2011-05-02 DIAGNOSIS — M8580 Other specified disorders of bone density and structure, unspecified site: Secondary | ICD-10-CM

## 2011-05-02 DIAGNOSIS — M899 Disorder of bone, unspecified: Secondary | ICD-10-CM

## 2011-05-06 ENCOUNTER — Ambulatory Visit (INDEPENDENT_AMBULATORY_CARE_PROVIDER_SITE_OTHER): Payer: BC Managed Care – PPO | Admitting: Sports Medicine

## 2011-05-06 VITALS — BP 138/60 | Ht 61.0 in

## 2011-05-06 DIAGNOSIS — M722 Plantar fascial fibromatosis: Secondary | ICD-10-CM

## 2011-05-06 MED ORDER — MELOXICAM 7.5 MG PO TABS: 7.5000 mg | ORAL_TABLET | ORAL | Status: DC | PRN

## 2011-05-06 NOTE — Progress Notes (Signed)
Pt here today for her CC of L heel/arch pain that has been ongoing for 2 mos now. Only relief seems to be from mobic, which was prescribed for another issue.

## 2011-05-06 NOTE — Assessment & Plan Note (Signed)
See instructions  Exercise, stretch and Mobic, arch strap  If not better 3 weeks we will inject before her trip using [place] Otherwise reck 2 mos

## 2011-05-06 NOTE — Patient Instructions (Signed)
We want you to take Mobic as needed for your foot pain. Do stretches and exercises 2 times per day. Make an appointment in 3 weeks and we will do an injection at that time if you aren't doing better.

## 2011-05-06 NOTE — Progress Notes (Signed)
Patient ID: [name], female   DOB: 05-Jan-1946, 66 y.o.   MRN: [id number] The patient is a 66 year old female who presents today for left heel pain. Patient reports that her pain began approximately 2 months ago when she switched her exercise shoes. She reports that her pain is made worse with running or bending of her foot and is located in the posterior arch region. She is tried Mobic which seems to help somewhat along with giving new shoes and placing heel cups in her shoes. All of these have seemed to help. She does have a trip planned to [place] in one month and does not feel that she would be able to do the amount of walking this requires currently.  Referred by off and running.  Using hard OTC arch/PF support that is painful.  Heel cup is helpful.  Exam: General: Appropriate white female, no distress Feet: Patient has relatively high arches bilaterally with evidence of previous bunion surgery bilaterally. Patient has notable inward deviation of the right great toe will standing. The patient's pain is located in the region of the plantar fascia attachment to the calcaneus on the left. She has slightly limited flexion of the great toes bilaterally with normal extension  Musculoskeletal ultrasound: Left plantar fascia 0.66 cm. The patient has a small spur and calcium deposition with in the tendon body. The patient has several small pockets of fluid with negative Doppler flow both on the plantar and on the dorsal aspect of the plantar fascia. Right plantar fascia is mildly thickened at 0.5 cm But does not show any calcification. There is also a very small spur on the right.  Assessment and plan: Plantar fasciitis: Will treat conservatively with arch, sports insoles, and plantar fasciitis exercises. Patient is to return in 3 weeks if she is not experiencing significant relief. At that point in time we would consider giving a steroid injection under ultrasound guidance for pain relief.

## 2011-05-07 ENCOUNTER — Telehealth: Payer: Self-pay | Admitting: Internal Medicine

## 2011-05-07 NOTE — Telephone Encounter (Signed)
Left mess for patient to call back.  

## 2011-05-07 NOTE — Telephone Encounter (Signed)
[name], please, inform patient that her bone density scan showed some bone loss c/w age. Take Vit D. We will discuss at the next ov Thx

## 2011-05-09 NOTE — Telephone Encounter (Signed)
Left mess for patient to call back.  

## 2011-05-15 NOTE — Telephone Encounter (Signed)
Notified pt. 

## 2011-05-27 ENCOUNTER — Ambulatory Visit (INDEPENDENT_AMBULATORY_CARE_PROVIDER_SITE_OTHER): Payer: BC Managed Care – PPO | Admitting: Sports Medicine

## 2011-05-27 VITALS — BP 133/69

## 2011-05-27 DIAGNOSIS — M722 Plantar fascial fibromatosis: Secondary | ICD-10-CM

## 2011-05-27 NOTE — Progress Notes (Signed)
  Subjective:    Patient ID: [name], female    DOB: 12/18/1945, 66 y.o.   MRN: [id number]  HPI  Pt presents to clinic for f/u of lt plantar fasciitis which has improved about 50% in only 3 weeks Wants to discuss whether she should get an injection today because she is going to [place] 06/25/11 and will be doing a lot of walking.   No night time pain that wakes her up. Using arch strap consistently and doing suggested exercises. Significant pain first thing in the mornings.  Mobic PRN for pain which helps.     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

## 2011-05-27 NOTE — Patient Instructions (Signed)
Use mobic daily for the next 2 weeks  Use arch strap and sports insoles as much as possible  Continue with plantar fasciitis exercise  Please follow up in 2 weeks if you are not improved  Thank you for seeing [place] today!

## 2011-05-27 NOTE — Assessment & Plan Note (Signed)
This has improved faster than is typical  I reassured her that we can wait a couple of weeks on injection If still is too limiting we will inject before trip to [place]  If continued improvement keep up same regimen and return in 6 to 8 weeks for some better inserts for shoes

## 2011-06-17 ENCOUNTER — Ambulatory Visit: Payer: BC Managed Care – PPO | Admitting: Sports Medicine

## 2011-08-18 ENCOUNTER — Ambulatory Visit (INDEPENDENT_AMBULATORY_CARE_PROVIDER_SITE_OTHER): Payer: BC Managed Care – PPO | Admitting: Adult Health

## 2011-08-18 ENCOUNTER — Encounter: Payer: Self-pay | Admitting: Adult Health

## 2011-08-18 VITALS — BP 130/66 | HR 81 | Temp 97.7°F | Ht 61.0 in | Wt 122.6 lb

## 2011-08-18 DIAGNOSIS — J45909 Unspecified asthma, uncomplicated: Secondary | ICD-10-CM

## 2011-08-18 MED ORDER — PREDNISONE 10 MG PO TABS: ORAL_TABLET | ORAL | Status: DC

## 2011-08-18 MED ORDER — BECLOMETHASONE DIPROPIONATE 80 MCG/ACT IN AERS: 2.0000 | INHALATION_SPRAY | Freq: Every day | RESPIRATORY_TRACT | Status: DC

## 2011-08-18 MED ORDER — LEVALBUTEROL HCL 0.63 MG/3ML IN NEBU
0.6300 mg | INHALATION_SOLUTION | Freq: Once | RESPIRATORY_TRACT | Status: AC
  Administered 2011-08-18: 0.63 mg via RESPIRATORY_TRACT

## 2011-08-18 MED ORDER — FLUTICASONE FUROATE 27.5 MCG/SPRAY NA SUSP: 2.0000 | NASAL | Status: DC | PRN

## 2011-08-18 NOTE — Assessment & Plan Note (Signed)
Flare with URI  xopenex neb in office  Extend steroid taper out for a 3 day taper  Increase QVAR 2 puffs Twice daily  Until back to baseline  Plan;  Finish Zpack  Extend Prednisone 40mg  daily x 3 days   then 30mg  for 3 days, 20 mg  for 3 days, then 10mg  for 3 days, then stop  Fluids and rest  May use Zyrtec 10mg  At bedtime  As needed  Drainage  Please contact office for sooner follow up if symptoms do not improve or worsen or seek emergency care

## 2011-08-18 NOTE — Progress Notes (Signed)
Subjective:    Patient ID: [name], female    DOB: 1945/10/06, 66 y.o.   MRN: [id number]  HPI  66 y.o. WF with moderate persistent asthma    08/18/2011 Acute OV  Patient presents for an acute office visit. Patient complains of a four-day history of cough with thick, yellow mucus, nasal congestion, and drainage. Complains of increased wheezing, and chest tightness.  Has noticed a peak flow drop over last 2 days. She has increased her Qvar inhaler up to 2 puffs twice daily. She started a Z-Pak and prednisone taper yesterday. Patient denies any hemoptysis, orthopnea, exertional chest pain, or leg swelling.   Review of Systems  Constitutional:   No  weight loss, night sweats,  Fevers, chills, + fatigue, lassitude. HEENT:   No headaches,  Difficulty swallowing,  Tooth/dental problems,  Sore throat,                No sneezing, itching, ear ache,  +nasal congestion, post nasal drip,   CV:  No chest pain,  Orthopnea, PND, swelling in lower extremities, anasarca, dizziness, palpitations  GI  No heartburn, indigestion, abdominal pain, nausea, vomiting, diarrhea, change in bowel habits, loss of appetite  Resp:   No coughing up of blood.     No chest wall deformity  Skin: no rash or lesions.  GU: no dysuria, change in color of urine, no urgency or frequency.  No flank pain.  MS:  No joint pain or swelling.  No decreased range of motion.  No back pain.  Psych:  No change in mood or affect. No depression or anxiety.  No memory loss.     Objective:   Physical Exam   Filed Vitals:   08/18/11 1550  BP: 130/66  Pulse: 81  Temp: 97.7 F (36.5 C)  TempSrc: Oral  Height: 5\' 1"  (1.549 m)  Weight: 122 lb 9.6 oz (55.611 kg)  SpO2: 97%    Gen: Pleasant, well-nourished, in no distress,  normal affect  ENT: No lesions,  mouth clear,  oropharynx clear, + postnasal drip  Neck: No JVD, no TMG, no carotid bruits  Lungs: No use of accessory muscles, no dullness to percussion,  coarse BS no wheezing  Good airflow   Cardiovascular: RRR, heart sounds normal, no murmur or gallops, no peripheral edema  Abdomen: soft and NT, no HSM,  BS normal  Musculoskeletal: No deformities, no cyanosis or clubbing  Neuro: alert, non focal  Skin: Warm, no lesions or rashes  No results found.       Assessment & Plan:   ASTHMA Flare with URI  xopenex neb in office  Extend steroid taper out for a 3 day taper  Increase QVAR 2 puffs Twice daily  Until back to baseline  Plan;  Finish Zpack  Extend Prednisone 40mg  daily x 3 days   then 30mg  for 3 days, 20 mg  for 3 days, then 10mg  for 3 days, then stop  Fluids and rest  May use Zyrtec 10mg  At bedtime  As needed  Drainage  Please contact office for sooner follow up if symptoms do not improve or worsen or seek emergency care      Updated Medication List Outpatient Encounter Prescriptions as of 08/18/2011  Medication Sig Dispense Refill  . aspirin 81 MG tablet Take 81 mg by mouth daily.        Marland Kitchen azithromycin (ZITHROMAX) 250 MG tablet Take 250 mg by mouth daily.      . beclomethasone (QVAR) 80  MCG/ACT inhaler Inhale 2 puffs into the lungs daily.  8.7 g  5  . calcium carbonate (OS-CAL) 600 MG TABS Take 600 mg by mouth daily.        . cetirizine (ZYRTEC) 10 MG tablet Take 10 mg by mouth as needed.       . cyclobenzaprine (FLEXERIL) 10 MG tablet Take 10 mg by mouth as needed.      Marland Kitchen estradiol (ESTRACE) 0.1 MG/GM vaginal cream Place 2 g vaginally 2 (two) times a week.        . fish oil-omega-3 fatty acids 1000 MG capsule Take 2 capsules (2 g total) by mouth daily.  100 capsule  3  . fluticasone (VERAMYST) 27.5 MCG/SPRAY nasal spray Place 2 sprays into the nose as needed.  10 g  5  . meloxicam (MOBIC) 7.5 MG tablet Take 1 tablet (7.5 mg total) by mouth as needed. Back papin  30 tablet  4  . naproxen sodium (ANAPROX) 220 MG tablet Take 220 mg by mouth as needed.        . Olmesartan-Amlodipine-HCTZ 20-5-12.5 MG TABS Take 1 tablet  by mouth as needed.      . Pediatric Multiple Vit-C-FA (MULTIVITAMIN ANIMAL SHAPES, WITH CA/FA,) WITH C & FA CHEW Chew 1 tablet by mouth daily.        . pirbuterol (MAXAIR) 200 MCG/INH inhaler Inhale 2 puffs into the lungs 4 (four) times daily as needed.  1 Inhaler  6  . predniSONE (DELTASONE) 10 MG tablet Take 4 for two days three for two days two for two days one for two days   20 tablet  0  . promethazine (PHENERGAN) 25 MG tablet Take 1 tablet (25 mg total) by mouth every 8 (eight) hours as needed for nausea.  30 tablet  0  . traMADol (ULTRAM) 50 MG tablet Take 1 tablet by mouth as needed. pain      . DISCONTD: beclomethasone (QVAR) 80 MCG/ACT inhaler Inhale 2 puffs into the lungs daily.  8.7 g  11  . DISCONTD: fluticasone (VERAMYST) 27.5 MCG/SPRAY nasal spray Place 2 sprays into the nose daily.  10 g  0  . DISCONTD: fluticasone (VERAMYST) 27.5 MCG/SPRAY nasal spray Place 2 sprays into the nose as needed.      Marland Kitchen DISCONTD: Olmesartan-Amlodipine-HCTZ (TRIBENZOR) 20-5-12.5 MG TABS Take 1 tablet by mouth 1 day or 1 dose.  90 tablet  3  . predniSONE (DELTASONE) 10 MG tablet 4 tabs for 3 days, then 3 tabs for 3 days, 2 tabs for 3 days, then 1 tab for 3 days, then stop  30 tablet  0   Facility-Administered Encounter Medications as of 08/18/2011  Medication Dose Route Frequency Provider Last Rate Last Dose  . levalbuterol (XOPENEX) nebulizer solution 0.63 mg  0.63 mg Nebulization Once Julio Sicks, NP   0.63 mg at 08/18/11 1632

## 2011-08-18 NOTE — Patient Instructions (Addendum)
Finish Zpack  Extend Prednisone 40mg  daily x 3 days   then 30mg  for 3 days, 20 mg  for 3 days, then 10mg  for 3 days, then stop  Fluids and rest  May use Zyrtec 10mg  At bedtime  As needed  Drainage  Please contact office for sooner follow up if symptoms do not improve or worsen or seek emergency care  follow up Dr. Delford Field  In 3-4 months and As needed

## 2011-08-21 ENCOUNTER — Telehealth: Payer: Self-pay | Admitting: Adult Health

## 2011-08-21 NOTE — Telephone Encounter (Signed)
Called for PA at # [phone] for Veramyst. Medication is not covered by insurance but there are several alternatives pt can try and if she fails then we can do PA. Alternatives are as follows: fluticasone, Nasonex, flunisolide, or triamcinolone. Please advise if any of the alternatives are ok for this pt. Thanks.[name], CMA Allergies  Allergen Reactions  . Albuterol   . Codeine   . Hydromorphone Hcl   . Losartan     HA, weak

## 2011-08-21 NOTE — Telephone Encounter (Signed)
Can use nasonex 1-2 puffs Twice daily  As needed   #1 As needed  Refills

## 2011-08-21 NOTE — Telephone Encounter (Signed)
Pharmacy advised. I have LMTCBx1 to advise the pt. Carron Curie, CMA

## 2011-10-13 ENCOUNTER — Telehealth: Payer: Self-pay | Admitting: Critical Care Medicine

## 2011-10-13 NOTE — Telephone Encounter (Signed)
I spoke with pt and she is scheduled to come in on wed 10/15/11 at 9 am

## 2011-10-15 ENCOUNTER — Ambulatory Visit: Payer: BC Managed Care – PPO

## 2011-10-17 ENCOUNTER — Ambulatory Visit (INDEPENDENT_AMBULATORY_CARE_PROVIDER_SITE_OTHER): Payer: BC Managed Care – PPO

## 2011-10-17 DIAGNOSIS — Z23 Encounter for immunization: Secondary | ICD-10-CM

## 2011-10-20 DIAGNOSIS — Z23 Encounter for immunization: Secondary | ICD-10-CM

## 2011-10-29 ENCOUNTER — Other Ambulatory Visit (INDEPENDENT_AMBULATORY_CARE_PROVIDER_SITE_OTHER): Payer: BC Managed Care – PPO

## 2011-10-29 DIAGNOSIS — I10 Essential (primary) hypertension: Secondary | ICD-10-CM

## 2011-10-29 LAB — BASIC METABOLIC PANEL
BUN: 26 mg/dL — ABNORMAL HIGH (ref 6–23)
CO2: 28 mEq/L (ref 19–32)
Calcium: 9.3 mg/dL (ref 8.4–10.5)
Chloride: 94 mEq/L — ABNORMAL LOW (ref 96–112)
Creatinine, Ser: 1 mg/dL (ref 0.4–1.2)
GFR: 57.93 mL/min — ABNORMAL LOW (ref >60.00)
Glucose, Bld: 81 mg/dL (ref 70–99)
Potassium: 3.9 mEq/L (ref 3.5–5.1)
Sodium: 130 mEq/L — ABNORMAL LOW (ref 135–145)

## 2011-11-03 ENCOUNTER — Ambulatory Visit: Payer: BC Managed Care – PPO | Admitting: Internal Medicine

## 2011-11-28 ENCOUNTER — Ambulatory Visit: Payer: BC Managed Care – PPO | Admitting: Internal Medicine

## 2011-12-02 ENCOUNTER — Telehealth: Payer: Self-pay | Admitting: Internal Medicine

## 2011-12-02 DIAGNOSIS — E871 Hypo-osmolality and hyponatremia: Secondary | ICD-10-CM

## 2011-12-02 NOTE — Telephone Encounter (Signed)
Patient Information:  Caller Name: Stephan Minister  Phone: [phone]  Patient: [name], [name]  Gender: Female  DOB: October 14, 1945  Age: 66 Years  PCP: Plotnikov, Alex (Adults only)   Symptoms  Reason For Call & Symptoms: Headache  Reviewed Health History In EMR: Yes  Reviewed Medications In EMR: Yes  Reviewed Allergies In EMR: Yes  Date of Onset of Symptoms: 11/18/2011  Guideline(s) Used:  Headache  Disposition Per Guideline:   Home Care  Reason For Disposition Reached:   Mild-moderate headache  Advice Given:  Call Back If:  You become worse.  Reassurance - Migraine Headache:  Here is some care advice that should help.  Pain Medicines:  For pain relief, you can take either acetaminophen, ibuprofen, or naproxen.  Call Back If:  You become worse.  Office Follow Up:  Does the office need to follow up with this patient?: Yes  Instructions For The Office: Patient is requesting to have Potassium and Sodium rechecked.  Please follow up with instructions or Appointment time if this is recommended.  RN Note: Patient states is also mildly Nauseated.  Pain 6/10. Patient is concerned that it may be due to her Potassium and Sodium levels.  Per Epic last Potassium was 3.9 and Sodium was 130 on 10-16/13.  Instructed to take Tylenol 650 mg and will return call at 1700 for follow up to see if pain and nausea has improved.    F/U call at 1656  Pain now a 3/10, Nausea is improved. Patient would like to have Potassium and Sodium rechecked for reassurance that it is not abnormal.

## 2011-12-03 NOTE — Telephone Encounter (Signed)
Ok BMET OV if problems Thx

## 2011-12-04 NOTE — Telephone Encounter (Signed)
Pt advised, labs ordered.

## 2011-12-05 ENCOUNTER — Other Ambulatory Visit (INDEPENDENT_AMBULATORY_CARE_PROVIDER_SITE_OTHER): Payer: BC Managed Care – PPO

## 2011-12-05 DIAGNOSIS — E871 Hypo-osmolality and hyponatremia: Secondary | ICD-10-CM

## 2011-12-05 LAB — BASIC METABOLIC PANEL
BUN: 26 mg/dL — ABNORMAL HIGH (ref 6–23)
CO2: 29 mEq/L (ref 19–32)
Calcium: 9.3 mg/dL (ref 8.4–10.5)
Chloride: 99 mEq/L (ref 96–112)
Creatinine, Ser: 1.1 mg/dL (ref 0.4–1.2)
GFR: 54.8 mL/min — ABNORMAL LOW (ref >60.00)
Glucose, Bld: 99 mg/dL (ref 70–99)
Potassium: 4.2 mEq/L (ref 3.5–5.1)
Sodium: 135 mEq/L (ref 135–145)

## 2011-12-16 ENCOUNTER — Ambulatory Visit: Payer: BC Managed Care – PPO | Admitting: Critical Care Medicine

## 2011-12-26 ENCOUNTER — Ambulatory Visit (INDEPENDENT_AMBULATORY_CARE_PROVIDER_SITE_OTHER): Payer: BC Managed Care – PPO | Admitting: Internal Medicine

## 2011-12-26 ENCOUNTER — Encounter: Payer: Self-pay | Admitting: Internal Medicine

## 2011-12-26 VITALS — BP 132/62 | HR 76 | Temp 98.6°F | Resp 16 | Wt 127.0 lb

## 2011-12-26 DIAGNOSIS — I10 Essential (primary) hypertension: Secondary | ICD-10-CM

## 2011-12-26 DIAGNOSIS — E871 Hypo-osmolality and hyponatremia: Secondary | ICD-10-CM

## 2011-12-26 DIAGNOSIS — G459 Transient cerebral ischemic attack, unspecified: Secondary | ICD-10-CM

## 2011-12-26 MED ORDER — BECLOMETHASONE DIPROPIONATE 80 MCG/ACT IN AERS: 2.0000 | INHALATION_SPRAY | Freq: Every day | RESPIRATORY_TRACT | Status: DC

## 2011-12-26 NOTE — Progress Notes (Signed)
   Subjective:    Patient ID: [name], female    DOB: 11/05/1945, 66 y.o.   MRN: [id number]  HPI  F/u TIA, HTN, abn Na on labs SBP 120-130s at home   BP Readings from Last 3 Encounters:  12/26/11 132/62  08/18/11 130/66  05/27/11 133/69   Wt Readings from Last 3 Encounters:  12/26/11 127 lb (57.607 kg)  08/18/11 122 lb 9.6 oz (55.611 kg)  04/21/11 124 lb (56.246 kg)     Review of Systems  Constitutional: Negative.  Negative for fever, chills, diaphoresis, activity change, appetite change, fatigue and unexpected weight change.  HENT: Negative for hearing loss, ear pain, nosebleeds, congestion, sore throat, facial swelling, rhinorrhea, sneezing, mouth sores, trouble swallowing, neck pain, neck stiffness, postnasal drip, sinus pressure and tinnitus.   Eyes: Negative for pain, discharge, redness, itching and visual disturbance.  Respiratory: Negative for cough, chest tightness, shortness of breath, wheezing and stridor.   Cardiovascular: Negative for chest pain, palpitations and leg swelling.  Gastrointestinal: Negative for nausea, diarrhea, constipation, blood in stool, abdominal distention, anal bleeding and rectal pain.  Genitourinary: Negative for dysuria, urgency, frequency, hematuria, flank pain, vaginal bleeding, vaginal discharge, difficulty urinating, genital sores and pelvic pain.  Musculoskeletal: Negative for back pain, joint swelling, arthralgias and gait problem.  Skin: Negative.  Negative for rash.  Neurological: Negative for dizziness, tremors, seizures, syncope, speech difficulty, weakness, numbness and headaches.  Hematological: Negative for adenopathy. Does not bruise/bleed easily.  Psychiatric/Behavioral: Negative for suicidal ideas, behavioral problems, sleep disturbance, dysphoric mood and decreased concentration. The patient is not nervous/anxious.        Objective:   Physical Exam  Constitutional: She appears well-developed. No distress.  HENT:   Head: Normocephalic.  Right Ear: External ear normal.  Left Ear: External ear normal.  Nose: Nose normal.  Mouth/Throat: Oropharynx is clear and moist.  Eyes: Conjunctivae normal are normal. Pupils are equal, round, and reactive to light. Right eye exhibits no discharge. Left eye exhibits no discharge.  Neck: Normal range of motion. Neck supple. No JVD present. No tracheal deviation present. No thyromegaly present.  Cardiovascular: Normal rate, regular rhythm and normal heart sounds.   Pulmonary/Chest: No stridor. No respiratory distress. She has no wheezes.  Abdominal: Soft. Bowel sounds are normal. She exhibits no distension and no mass. There is no tenderness. There is no rebound and no guarding.  Musculoskeletal: She exhibits no edema and no tenderness.  Lymphadenopathy:    She has no cervical adenopathy.  Neurological: She displays normal reflexes. No cranial nerve deficit. She exhibits normal muscle tone. Coordination normal.  Skin: No rash noted. No erythema.  Psychiatric: She has a normal mood and affect. Her behavior is normal. Judgment and thought content normal.   Lab Results  Component Value Date   WBC 4.6 07/20/2010   HGB 12.8 07/20/2010   HCT 37.0 07/20/2010   PLT 221 07/20/2010   GLUCOSE 99 12/05/2011   CHOL 247* 08/27/2010   TRIG 166.0* 08/27/2010   HDL 77.40 08/27/2010   LDLDIRECT 146.7 08/27/2010   LDLCALC 113* 07/21/2010   ALT 20 08/27/2010   AST 18 08/27/2010   NA 135 12/05/2011   K 4.2 12/05/2011   CL 99 12/05/2011   CREATININE 1.1 12/05/2011   BUN 26* 12/05/2011   CO2 29 12/05/2011   TSH 2.525 07/21/2010   INR 0.95 07/20/2010   HGBA1C 5.9 02/18/2011          Assessment & Plan:

## 2011-12-28 NOTE — Assessment & Plan Note (Signed)
Goal BP was discussed

## 2011-12-28 NOTE — Assessment & Plan Note (Signed)
No relapse 

## 2011-12-28 NOTE — Assessment & Plan Note (Signed)
Resolved

## 2012-01-26 ENCOUNTER — Telehealth: Payer: Self-pay | Admitting: Internal Medicine

## 2012-01-26 NOTE — Telephone Encounter (Signed)
Caller: [name]/Patient; Phone: [phone]; Reason for Call: Pt is calling because she has appt scheduled for 01/27/12 with Dr Posey Rea.  Pt is coming in to talk with the doctor about her symptoms of nausea for three days that she feels is medicine related.  Pt wanted to know if Dr Posey Rea would like her to come in prior to her appt for some blood work.  RN unable to answer question for patient; OFFICE PLEASE FOLLOW UP IF PT SHOULD COME IN EARLY FOR BLOOD WORK

## 2012-01-27 ENCOUNTER — Ambulatory Visit (INDEPENDENT_AMBULATORY_CARE_PROVIDER_SITE_OTHER): Payer: BC Managed Care – PPO | Admitting: Internal Medicine

## 2012-01-27 ENCOUNTER — Other Ambulatory Visit (INDEPENDENT_AMBULATORY_CARE_PROVIDER_SITE_OTHER): Payer: BC Managed Care – PPO

## 2012-01-27 ENCOUNTER — Encounter: Payer: Self-pay | Admitting: Internal Medicine

## 2012-01-27 ENCOUNTER — Telehealth: Payer: Self-pay | Admitting: *Deleted

## 2012-01-27 VITALS — BP 148/78 | HR 80 | Temp 97.8°F | Resp 16 | Wt 125.0 lb

## 2012-01-27 DIAGNOSIS — I10 Essential (primary) hypertension: Secondary | ICD-10-CM

## 2012-01-27 DIAGNOSIS — Z0000 Encounter for general adult medical examination without abnormal findings: Secondary | ICD-10-CM

## 2012-01-27 DIAGNOSIS — G459 Transient cerebral ischemic attack, unspecified: Secondary | ICD-10-CM

## 2012-01-27 DIAGNOSIS — R110 Nausea: Secondary | ICD-10-CM

## 2012-01-27 DIAGNOSIS — R030 Elevated blood-pressure reading, without diagnosis of hypertension: Secondary | ICD-10-CM

## 2012-01-27 HISTORY — DX: Nausea: R11.0

## 2012-01-27 MED ORDER — NEBIVOLOL HCL 10 MG PO TABS: 10.0000 mg | ORAL_TABLET | Freq: Every day | ORAL | Status: DC

## 2012-01-27 MED ORDER — PROMETHAZINE HCL 12.5 MG PO TABS: 12.5000 mg | ORAL_TABLET | Freq: Three times a day (TID) | ORAL | Status: DC | PRN

## 2012-01-27 NOTE — Assessment & Plan Note (Signed)
Start Bystolic 

## 2012-01-27 NOTE — Progress Notes (Signed)
   Subjective:   HPI  C/o nausea x 1 wk - she stopped Tribenzor 4 d ago - better F/u TIA, HTN, abn Na on labs SBP 120-130s at home; lately BP started to go up   BP Readings from Last 3 Encounters:  01/27/12 148/78  12/26/11 132/62  08/18/11 130/66   Wt Readings from Last 3 Encounters:  01/27/12 125 lb (56.7 kg)  12/26/11 127 lb (57.607 kg)  08/18/11 122 lb 9.6 oz (55.611 kg)     Review of Systems  Constitutional: Negative.  Negative for fever, chills, diaphoresis, activity change, appetite change, fatigue and unexpected weight change.  HENT: Negative for hearing loss, ear pain, nosebleeds, congestion, sore throat, facial swelling, rhinorrhea, sneezing, mouth sores, trouble swallowing, neck pain, neck stiffness, postnasal drip, sinus pressure and tinnitus.   Eyes: Negative for pain, discharge, redness, itching and visual disturbance.  Respiratory: Negative for cough, chest tightness, shortness of breath, wheezing and stridor.   Cardiovascular: Negative for chest pain, palpitations and leg swelling.  Gastrointestinal: Negative for nausea, diarrhea, constipation, blood in stool, abdominal distention, anal bleeding and rectal pain.  Genitourinary: Negative for dysuria, urgency, frequency, hematuria, flank pain, vaginal bleeding, vaginal discharge, difficulty urinating, genital sores and pelvic pain.  Musculoskeletal: Negative for back pain, joint swelling, arthralgias and gait problem.  Skin: Negative.  Negative for rash.  Neurological: Negative for dizziness, tremors, seizures, syncope, speech difficulty, weakness, numbness and headaches.  Hematological: Negative for adenopathy. Does not bruise/bleed easily.  Psychiatric/Behavioral: Negative for suicidal ideas, behavioral problems, sleep disturbance, dysphoric mood and decreased concentration. The patient is not nervous/anxious.        Objective:   Physical Exam  Constitutional: She appears well-developed. No distress.  HENT:    Head: Normocephalic.  Right Ear: External ear normal.  Left Ear: External ear normal.  Nose: Nose normal.  Mouth/Throat: Oropharynx is clear and moist.  Eyes: Conjunctivae normal are normal. Pupils are equal, round, and reactive to light. Right eye exhibits no discharge. Left eye exhibits no discharge.  Neck: Normal range of motion. Neck supple. No JVD present. No tracheal deviation present. No thyromegaly present.  Cardiovascular: Normal rate, regular rhythm and normal heart sounds.   Pulmonary/Chest: No stridor. No respiratory distress. She has no wheezes.  Abdominal: Soft. Bowel sounds are normal. She exhibits no distension and no mass. There is no tenderness. There is no rebound and no guarding.  Musculoskeletal: She exhibits no edema and no tenderness.  Lymphadenopathy:    She has no cervical adenopathy.  Neurological: She displays normal reflexes. No cranial nerve deficit. She exhibits normal muscle tone. Coordination normal.  Skin: No rash noted. No erythema.  Psychiatric: She has a normal mood and affect. Her behavior is normal. Judgment and thought content normal.   Lab Results  Component Value Date   WBC 4.6 07/20/2010   HGB 12.8 07/20/2010   HCT 37.0 07/20/2010   PLT 221 07/20/2010   GLUCOSE 99 12/05/2011   CHOL 247* 08/27/2010   TRIG 166.0* 08/27/2010   HDL 77.40 08/27/2010   LDLDIRECT 146.7 08/27/2010   LDLCALC 113* 07/21/2010   ALT 20 08/27/2010   AST 18 08/27/2010   NA 135 12/05/2011   K 4.2 12/05/2011   CL 99 12/05/2011   CREATININE 1.1 12/05/2011   BUN 26* 12/05/2011   CO2 29 12/05/2011   TSH 2.525 07/21/2010   INR 0.95 07/20/2010   HGBA1C 5.9 02/18/2011          Assessment & Plan:

## 2012-01-27 NOTE — Assessment & Plan Note (Signed)
Will try Bystolic 10-20 mg/d Labs

## 2012-01-27 NOTE — Telephone Encounter (Signed)
Message copied by Merrilyn Puma on Tue Jan 27, 2012 10:08 AM ------      Message from: Etheleen Sia      Created: Fri Dec 26, 2011  3:50 PM      Regarding: LABS       PHYSICAL IN [place]

## 2012-01-27 NOTE — Assessment & Plan Note (Signed)
We will use single component BP meds in the future 1/14 d/c Tribenzor

## 2012-01-27 NOTE — Telephone Encounter (Signed)
We can do labs after the appt Thx

## 2012-01-27 NOTE — Telephone Encounter (Signed)
Labs entered.

## 2012-01-27 NOTE — Assessment & Plan Note (Signed)
No relapse 

## 2012-01-28 LAB — BASIC METABOLIC PANEL
BUN: 15 mg/dL (ref 6–23)
CO2: 29 mEq/L (ref 19–32)
Calcium: 9.2 mg/dL (ref 8.4–10.5)
Chloride: 103 mEq/L (ref 96–112)
Creatinine, Ser: 0.9 mg/dL (ref 0.4–1.2)
GFR: 63.6 mL/min (ref >60.00)
Glucose, Bld: 121 mg/dL — ABNORMAL HIGH (ref 70–99)
Potassium: 4.1 mEq/L (ref 3.5–5.1)
Sodium: 136 mEq/L (ref 135–145)

## 2012-01-28 LAB — LIPASE: Lipase: 23 U/L (ref 11.0–59.0)

## 2012-01-29 ENCOUNTER — Telehealth: Payer: Self-pay | Admitting: Internal Medicine

## 2012-01-29 ENCOUNTER — Encounter: Payer: Self-pay | Admitting: Internal Medicine

## 2012-01-29 NOTE — Telephone Encounter (Signed)
[name], please, inform patient that all labs are normal except for glu 121 Plan - as we discussed Thx

## 2012-01-29 NOTE — Telephone Encounter (Signed)
Left detailed mess informing pt of below.  

## 2012-03-08 ENCOUNTER — Ambulatory Visit: Payer: BC Managed Care – PPO | Admitting: Internal Medicine

## 2012-04-19 ENCOUNTER — Ambulatory Visit (INDEPENDENT_AMBULATORY_CARE_PROVIDER_SITE_OTHER): Payer: BC Managed Care – PPO | Admitting: Internal Medicine

## 2012-04-19 ENCOUNTER — Encounter: Payer: Self-pay | Admitting: Internal Medicine

## 2012-04-19 VITALS — BP 132/70 | HR 72 | Temp 98.1°F | Resp 16 | Wt 127.0 lb

## 2012-04-19 DIAGNOSIS — I10 Essential (primary) hypertension: Secondary | ICD-10-CM

## 2012-04-19 NOTE — Patient Instructions (Addendum)
Take Bystolic at night 

## 2012-04-19 NOTE — Progress Notes (Signed)
Patient ID: [name], female   DOB: [birth year], 67 y.o.   MRN: [id number]   Subjective:   HPI  C/o nausea x 1 wk - she stopped Tribenzor 4 d ago - better F/u TIA, HTN, abn Na on labs SBP 120-130s at home; lately BP started to go up   BP Readings from Last 3 Encounters:  04/19/12 132/70  01/27/12 148/78  12/26/11 132/62   Wt Readings from Last 3 Encounters:  04/19/12 127 lb (57.607 kg)  01/27/12 125 lb (56.7 kg)  12/26/11 127 lb (57.607 kg)     Review of Systems  Constitutional: Negative.  Negative for fever, chills, diaphoresis, activity change, appetite change, fatigue and unexpected weight change.  HENT: Negative for hearing loss, ear pain, nosebleeds, congestion, sore throat, facial swelling, rhinorrhea, sneezing, mouth sores, trouble swallowing, neck pain, neck stiffness, postnasal drip, sinus pressure and tinnitus.   Eyes: Negative for pain, discharge, redness, itching and visual disturbance.  Respiratory: Negative for cough, chest tightness, shortness of breath, wheezing and stridor.   Cardiovascular: Negative for chest pain, palpitations and leg swelling.  Gastrointestinal: Negative for nausea, diarrhea, constipation, blood in stool, abdominal distention, anal bleeding and rectal pain.  Genitourinary: Negative for dysuria, urgency, frequency, hematuria, flank pain, vaginal bleeding, vaginal discharge, difficulty urinating, genital sores and pelvic pain.  Musculoskeletal: Negative for back pain, joint swelling, arthralgias and gait problem.  Skin: Negative.  Negative for rash.  Neurological: Negative for dizziness, tremors, seizures, syncope, speech difficulty, weakness, numbness and headaches.  Hematological: Negative for adenopathy. Does not bruise/bleed easily.  Psychiatric/Behavioral: Negative for suicidal ideas, behavioral problems, sleep disturbance, dysphoric mood and decreased concentration. The patient is not nervous/anxious.        Objective:   Physical  Exam  Constitutional: She appears well-developed. No distress.  HENT:  Head: Normocephalic.  Right Ear: External ear normal.  Left Ear: External ear normal.  Nose: Nose normal.  Mouth/Throat: Oropharynx is clear and moist.  Eyes: Conjunctivae are normal. Pupils are equal, round, and reactive to light. Right eye exhibits no discharge. Left eye exhibits no discharge.  Neck: Normal range of motion. Neck supple. No JVD present. No tracheal deviation present. No thyromegaly present.  Cardiovascular: Normal rate, regular rhythm and normal heart sounds.   Pulmonary/Chest: No stridor. No respiratory distress. She has no wheezes.  Abdominal: Soft. Bowel sounds are normal. She exhibits no distension and no mass. There is no tenderness. There is no rebound and no guarding.  Musculoskeletal: She exhibits no edema and no tenderness.  Lymphadenopathy:    She has no cervical adenopathy.  Neurological: She displays normal reflexes. No cranial nerve deficit. She exhibits normal muscle tone. Coordination normal.  Skin: No rash noted. No erythema.  Psychiatric: She has a normal mood and affect. Her behavior is normal. Judgment and thought content normal.   Lab Results  Component Value Date   WBC 4.6 07/20/2010   HGB 12.8 07/20/2010   HCT 37.0 07/20/2010   PLT 221 07/20/2010   GLUCOSE 121* 01/27/2012   CHOL 247* 08/27/2010   TRIG 166.0* 08/27/2010   HDL 77.40 08/27/2010   LDLDIRECT 146.7 08/27/2010   LDLCALC 113* 07/21/2010   ALT 20 08/27/2010   AST 18 08/27/2010   NA 136 01/27/2012   K 4.1 01/27/2012   CL 103 01/27/2012   CREATININE 0.9 01/27/2012   BUN 15 01/27/2012   CO2 29 01/27/2012   TSH 2.525 07/21/2010   INR 0.95 07/20/2010   HGBA1C 5.9 02/18/2011  Assessment & Plan:

## 2012-04-21 ENCOUNTER — Encounter: Payer: Self-pay | Admitting: Internal Medicine

## 2012-04-21 NOTE — Assessment & Plan Note (Signed)
BP Readings from Last 3 Encounters:  04/19/12 132/70  01/27/12 148/78  12/26/11 132/62  Continue with current prescription therapy as reflected on the Med list.

## 2012-04-29 ENCOUNTER — Ambulatory Visit (INDEPENDENT_AMBULATORY_CARE_PROVIDER_SITE_OTHER): Payer: BC Managed Care – PPO | Admitting: Internal Medicine

## 2012-04-29 ENCOUNTER — Encounter: Payer: Self-pay | Admitting: Internal Medicine

## 2012-04-29 ENCOUNTER — Telehealth: Payer: Self-pay | Admitting: Critical Care Medicine

## 2012-04-29 VITALS — BP 128/68 | HR 55 | Temp 98.0°F

## 2012-04-29 DIAGNOSIS — J209 Acute bronchitis, unspecified: Secondary | ICD-10-CM

## 2012-04-29 DIAGNOSIS — J45901 Unspecified asthma with (acute) exacerbation: Secondary | ICD-10-CM

## 2012-04-29 MED ORDER — LEVOFLOXACIN 500 MG PO TABS: 500.0000 mg | ORAL_TABLET | Freq: Every day | ORAL | Status: DC

## 2012-04-29 MED ORDER — PREDNISONE 10 MG PO TABS: ORAL_TABLET | ORAL | Status: DC

## 2012-04-29 NOTE — Telephone Encounter (Signed)
Pt called [facility] at 12:12 Appt scheduled with her PCP at 12:16 for a 1:15pm appt Has already been seen by Nicki Reaper NP: Acute asthma exacerbation with bronchitis, new onset:  Get some rest and drink plenty of water  Do salt water gargles for the sore throat  eRx for Levaquin x 7 days  eRx for pred taper  RTC as needed or if symptoms persist  ATC pt to apologize - NA.  WCB.

## 2012-04-29 NOTE — Progress Notes (Signed)
HPI  Pt presents to the clinic today with c/o worseing asthma symptoms x 2 weeks. She does have a productive cough. She has been running low grade fevers. She has been wheezing. She is taking QVAR and zyrtec. Her peak flows have been good. She does feel extremely fatigued. She is concerned that she may have a bronchitis again.  Review of Systems      Past Medical History  Diagnosis Date  . Osteopenia   . Allergic rhinitis     seasonal  . Asthma     -FeV1 106% 2006  . Sinusitis   . Urinary tract infection, site not specified   . Hypertension   . Lower back pain     Family History  Problem Relation Age of Onset  . Stroke Mother   . Cancer Mother 37    stomach  . Heart disease Mother   . Parkinsonism Father   . Glaucoma Father     History   Social History  . Marital Status: Single    Spouse Name: N/A    Number of Children: N/A  . Years of Education: N/A   Occupational History  . Not on file.   Social History Main Topics  . Smoking status: Never Smoker   . Smokeless tobacco: Never Used  . Alcohol Use: 1.2 oz/week    2 Glasses of wine per week  . Drug Use: No  . Sexually Active: Yes    Birth Control/ Protection: Other-see comments   Other Topics Concern  . Not on file   Social History Narrative   Regular exercise-yes.    Allergies  Allergen Reactions  . Albuterol   . Amlodipine     Weak   . Codeine   . Hctz (Hydrochlorothiazide)     dizziness  . Hydromorphone Hcl   . Losartan     HA, weak     Constitutional: Positive headache, fatigue and fever. Denies abrupt weight changes.  HEENT:  Positive sore throat. Denies eye redness, eye pain, pressure behind the eyes, facial pain, nasal congestion, ear pain, ringing in the ears, wax buildup, runny nose or bloody nose. Respiratory: Positive cough and sputum production. Denies difficulty breathing or shortness of breath.  Cardiovascular: Denies chest pain, chest tightness, palpitations or swelling in the  hands or feet.   No other specific complaints in a complete review of systems (except as listed in HPI above).  Objective:   BP 128/68  Pulse 55  Temp(Src) 98 F (36.7 C) (Oral)  SpO2 97% Wt Readings from Last 3 Encounters:  04/19/12 127 lb (57.607 kg)  01/27/12 125 lb (56.7 kg)  12/26/11 127 lb (57.607 kg)     General: Appears her stated age, well developed, well nourished in NAD. HEENT: Head: normal shape and size; Eyes: sclera white, no icterus, conjunctiva pink, PERRLA and EOMs intact; Ears: Tm's gray and intact, normal light reflex; Nose: mucosa pink and moist, septum midline; Throat/Mouth: + PND. Teeth present, mucosa erythematous and moist, no exudate noted, no lesions or ulcerations noted.  Neck: Mild cervical lymphadenopathy. Neck supple, trachea midline. No massses, lumps or thyromegaly present.  Cardiovascular: Normal rate and rhythm. S1,S2 noted.  No murmur, rubs or gallops noted. No JVD or BLE edema. No carotid bruits noted. Pulmonary/Chest: Normal effort and bilateral expiratory wheezing. No respiratory distress. No rales or ronchi noted.      Assessment & Plan:   Acute asthma exacerbation with bronchitis, new onset:  Get some rest and drink plenty of  water Do salt water gargles for the sore throat eRx for Levaquin x 7 days eRx for pred taper  RTC as needed or if symptoms persist.

## 2012-04-29 NOTE — Patient Instructions (Signed)

## 2012-05-03 NOTE — Telephone Encounter (Signed)
ATC patient, no answer [id number] 

## 2012-05-04 NOTE — Telephone Encounter (Signed)
ATC patient, no answer [id number] 

## 2012-05-05 NOTE — Telephone Encounter (Signed)
LMTCB

## 2012-05-06 NOTE — Telephone Encounter (Signed)
ATC patient. We have tried to reach patient x5 Will now close message

## 2012-06-21 ENCOUNTER — Encounter: Payer: BC Managed Care – PPO | Admitting: Internal Medicine

## 2012-06-22 ENCOUNTER — Encounter: Payer: Self-pay | Admitting: Sports Medicine

## 2012-06-22 ENCOUNTER — Ambulatory Visit (INDEPENDENT_AMBULATORY_CARE_PROVIDER_SITE_OTHER): Payer: BC Managed Care – PPO | Admitting: Sports Medicine

## 2012-06-22 VITALS — BP 147/75 | HR 55 | Ht 61.0 in | Wt 127.0 lb

## 2012-06-22 DIAGNOSIS — M722 Plantar fascial fibromatosis: Secondary | ICD-10-CM | POA: Insufficient documentation

## 2012-06-22 HISTORY — DX: Plantar fascial fibromatosis: M72.2

## 2012-06-22 MED ORDER — MELOXICAM 7.5 MG PO TABS: 7.5000 mg | ORAL_TABLET | Freq: Every day | ORAL | Status: DC | PRN

## 2012-06-22 NOTE — Assessment & Plan Note (Signed)
Consent obtained and verified. Sterile prep. Furthur cleansed with alcohol. Topical analgesic spray: Ethyl chloride. Joint: Right plantar fascial Approached in typical fashion with: Medial foot at a level just above the plantar fascial membrane Completed without difficulty Meds: 20 mg Depo-Medrol and 2 cc of lidocaine 1% Needle: 25-gauge and 1.5 inches Aftercare instructions and Red flags advised.  Advised in a home exercise program She was also given a sports insole to help cushion the feet Stretching program Icing  She was wearing a heel cup which is comfortable and should continue to wear shoes that have some support from the arch   If this is not resolving because of recurrences I think she should consider a custom orthotic

## 2012-06-22 NOTE — Progress Notes (Signed)
Patient ID: [name], female   DOB: 05/03/1945, 67 y.o.   MRN: [id number]  Patient returns for right plantar fascial symptoms. We treated this 1 year ago and it got better. She had started walking around the house some barefoot and wearing some flat sandals. After this the pain started returning. She is planning a beach trip and also is going to a graduation and needs to be on her feet a good bit. She restarted the exercises over the last 6 weeks and has seen some improvement. However she still has heel pain on the right that is worst in the mornings.  She has a history of bunion surgery on the right and left.  On her visit last year we put her in sports insoles and an arch strap. The S. and the exercises she was given helped a great deal. However most the pain improve significantly with injection and she would like to try that again.  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 The right shows more curved than the left. There is a scar over the first MTP joint of both feet. The medial column is deviated into varus on the right involving the first 3 toes. Toes 4 and 5 show hammering and a small bunionette. 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 She has tenderness on the insertion of the medial plantar fascia into the heel.  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 There is no tenderness and there is less breakdown of the foot in general.

## 2012-07-01 ENCOUNTER — Encounter: Payer: BC Managed Care – PPO | Admitting: Internal Medicine

## 2012-08-18 ENCOUNTER — Other Ambulatory Visit: Payer: Self-pay

## 2012-08-23 ENCOUNTER — Other Ambulatory Visit (INDEPENDENT_AMBULATORY_CARE_PROVIDER_SITE_OTHER): Payer: BC Managed Care – PPO

## 2012-08-23 DIAGNOSIS — Z0000 Encounter for general adult medical examination without abnormal findings: Secondary | ICD-10-CM

## 2012-08-23 LAB — HEPATIC FUNCTION PANEL
ALT: 17 U/L (ref 0–35)
AST: 22 U/L (ref 0–37)
Albumin: 3.9 g/dL (ref 3.5–5.2)
Alkaline Phosphatase: 37 U/L — ABNORMAL LOW (ref 39–117)
Bilirubin, Direct: 0.1 mg/dL (ref 0.0–0.3)
Total Bilirubin: 0.5 mg/dL (ref 0.3–1.2)
Total Protein: 6.8 g/dL (ref 6.0–8.3)

## 2012-08-23 LAB — URINALYSIS, ROUTINE W REFLEX MICROSCOPIC
Bilirubin Urine: NEGATIVE
Hgb urine dipstick: NEGATIVE
Ketones, ur: NEGATIVE
Leukocytes, UA: NEGATIVE
Nitrite: NEGATIVE
RBC / HPF: NONE SEEN (ref 0–?)
Specific Gravity, Urine: 1.015 (ref 1.000–1.030)
Total Protein, Urine: NEGATIVE
Urine Glucose: NEGATIVE
Urobilinogen, UA: 0.2 (ref 0.0–1.0)
pH: 7 (ref 5.0–8.0)

## 2012-08-23 LAB — BASIC METABOLIC PANEL
BUN: 19 mg/dL (ref 6–23)
CO2: 27 mEq/L (ref 19–32)
Calcium: 9.2 mg/dL (ref 8.4–10.5)
Chloride: 104 mEq/L (ref 96–112)
Creatinine, Ser: 1.1 mg/dL (ref 0.4–1.2)
GFR: 54.09 mL/min — ABNORMAL LOW (ref >60.00)
Glucose, Bld: 90 mg/dL (ref 70–99)
Potassium: 4.8 mEq/L (ref 3.5–5.1)
Sodium: 139 mEq/L (ref 135–145)

## 2012-08-27 ENCOUNTER — Ambulatory Visit (INDEPENDENT_AMBULATORY_CARE_PROVIDER_SITE_OTHER): Payer: BC Managed Care – PPO | Admitting: Internal Medicine

## 2012-08-27 ENCOUNTER — Encounter: Payer: Self-pay | Admitting: Internal Medicine

## 2012-08-27 VITALS — BP 124/60 | HR 64 | Temp 98.5°F | Ht 61.0 in | Wt 126.0 lb

## 2012-08-27 DIAGNOSIS — R739 Hyperglycemia, unspecified: Secondary | ICD-10-CM

## 2012-08-27 DIAGNOSIS — J45909 Unspecified asthma, uncomplicated: Secondary | ICD-10-CM

## 2012-08-27 DIAGNOSIS — Z2911 Encounter for prophylactic immunotherapy for respiratory syncytial virus (RSV): Secondary | ICD-10-CM

## 2012-08-27 DIAGNOSIS — R7309 Other abnormal glucose: Secondary | ICD-10-CM

## 2012-08-27 DIAGNOSIS — Z0000 Encounter for general adult medical examination without abnormal findings: Secondary | ICD-10-CM

## 2012-08-27 DIAGNOSIS — I10 Essential (primary) hypertension: Secondary | ICD-10-CM

## 2012-08-27 DIAGNOSIS — Z23 Encounter for immunization: Secondary | ICD-10-CM

## 2012-08-27 HISTORY — DX: Hyperglycemia, unspecified: R73.9

## 2012-08-27 NOTE — Progress Notes (Signed)
Subjective:     HPI  The patient is here for a wellness exam. The patient has been doing well overall without major physical or psychological issues going on lately. F/u TIA, HTN, abn Na on labs SBP 120-130s at home   BP Readings from Last 3 Encounters:  08/27/12 124/60  06/22/12 147/75  04/29/12 128/68   Wt Readings from Last 3 Encounters:  08/27/12 126 lb (57.153 kg)  06/22/12 127 lb (57.607 kg)  04/19/12 127 lb (57.607 kg)     Review of Systems  Constitutional: Negative.  Negative for fever, chills, diaphoresis, activity change, appetite change, fatigue and unexpected weight change.  HENT: Negative for hearing loss, ear pain, nosebleeds, congestion, sore throat, facial swelling, rhinorrhea, sneezing, mouth sores, trouble swallowing, neck pain, neck stiffness, postnasal drip, sinus pressure and tinnitus.   Eyes: Negative for pain, discharge, redness, itching and visual disturbance.  Respiratory: Negative for cough, chest tightness, shortness of breath, wheezing and stridor.   Cardiovascular: Negative for chest pain, palpitations and leg swelling.  Gastrointestinal: Negative for nausea, diarrhea, constipation, blood in stool, abdominal distention, anal bleeding and rectal pain.  Genitourinary: Negative for dysuria, urgency, frequency, hematuria, flank pain, vaginal bleeding, vaginal discharge, difficulty urinating, genital sores and pelvic pain.  Musculoskeletal: Negative for back pain, joint swelling, arthralgias and gait problem.  Skin: Negative.  Negative for rash.  Neurological: Negative for dizziness, tremors, seizures, syncope, speech difficulty, weakness, numbness and headaches.  Hematological: Negative for adenopathy. Does not bruise/bleed easily.  Psychiatric/Behavioral: Negative for suicidal ideas, behavioral problems, sleep disturbance, dysphoric mood and decreased concentration. The patient is not nervous/anxious.        Objective:   Physical Exam   Constitutional: She appears well-developed. No distress.  HENT:  Head: Normocephalic.  Right Ear: External ear normal.  Left Ear: External ear normal.  Nose: Nose normal.  Mouth/Throat: Oropharynx is clear and moist.  Eyes: Conjunctivae are normal. Pupils are equal, round, and reactive to light. Right eye exhibits no discharge. Left eye exhibits no discharge.  Neck: Normal range of motion. Neck supple. No JVD present. No tracheal deviation present. No thyromegaly present.  Cardiovascular: Normal rate, regular rhythm and normal heart sounds.   Pulmonary/Chest: No stridor. No respiratory distress. She has no wheezes.  Abdominal: Soft. Bowel sounds are normal. She exhibits no distension and no mass. There is no tenderness. There is no rebound and no guarding.  Musculoskeletal: She exhibits no edema and no tenderness.  Lymphadenopathy:    She has no cervical adenopathy.  Neurological: She displays normal reflexes. No cranial nerve deficit. She exhibits normal muscle tone. Coordination normal.  Skin: No rash noted. No erythema.  Psychiatric: She has a normal mood and affect. Her behavior is normal. Judgment and thought content normal.   Lab Results  Component Value Date   WBC 4.6 07/20/2010   HGB 12.8 07/20/2010   HCT 37.0 07/20/2010   PLT 221 07/20/2010   GLUCOSE 90 08/23/2012   CHOL 247* 08/27/2010   TRIG 166.0* 08/27/2010   HDL 77.40 08/27/2010   LDLDIRECT 146.7 08/27/2010   LDLCALC 113* 07/21/2010   ALT 17 08/23/2012   AST 22 08/23/2012   NA 139 08/23/2012   K 4.8 08/23/2012   CL 104 08/23/2012   CREATININE 1.1 08/23/2012   BUN 19 08/23/2012   CO2 27 08/23/2012   TSH 2.525 07/21/2010   INR 0.95 07/20/2010   HGBA1C 5.9 02/18/2011          Assessment & Plan:

## 2012-08-27 NOTE — Assessment & Plan Note (Signed)
We discussed age appropriate health related issues, including available/recomended screening tests and vaccinations. We discussed a need for adhering to healthy diet and exercise. Labs/EKG were reviewed/ordered. All questions were answered.   

## 2012-08-27 NOTE — Assessment & Plan Note (Signed)
Continue with current prescription therapy as reflected on the Med list. Gluten free trial (no wheat products) x4-6 weeks. OK to use Gluten-free bread and pasta. Milk free trial (no milk, ice cream and yogurt) x4 weeks. OK to use almond or soy milk.  

## 2012-08-27 NOTE — Assessment & Plan Note (Signed)
Try Benicar or tekturna

## 2012-08-27 NOTE — Patient Instructions (Addendum)
Gluten free trial (no wheat products) x4-6 weeks. OK to use Gluten-free bread and pasta.  Milk free trial (no milk, ice cream and yogurt) x4 weeks. OK to use almond or soy milk.  Try Benicar 1/2 -1 a day or you can try Tekturna 1 a day

## 2012-09-25 ENCOUNTER — Other Ambulatory Visit: Payer: Self-pay | Admitting: Internal Medicine

## 2012-10-06 ENCOUNTER — Other Ambulatory Visit (INDEPENDENT_AMBULATORY_CARE_PROVIDER_SITE_OTHER): Payer: BC Managed Care – PPO

## 2012-10-06 DIAGNOSIS — J45909 Unspecified asthma, uncomplicated: Secondary | ICD-10-CM

## 2012-10-06 DIAGNOSIS — I10 Essential (primary) hypertension: Secondary | ICD-10-CM

## 2012-10-06 DIAGNOSIS — R7309 Other abnormal glucose: Secondary | ICD-10-CM

## 2012-10-06 DIAGNOSIS — Z0000 Encounter for general adult medical examination without abnormal findings: Secondary | ICD-10-CM

## 2012-10-06 DIAGNOSIS — R739 Hyperglycemia, unspecified: Secondary | ICD-10-CM

## 2012-10-06 LAB — BASIC METABOLIC PANEL
BUN: 22 mg/dL (ref 6–23)
CO2: 30 mEq/L (ref 19–32)
Calcium: 8.9 mg/dL (ref 8.4–10.5)
Chloride: 103 mEq/L (ref 96–112)
Creatinine, Ser: 1 mg/dL (ref 0.4–1.2)
GFR: 59.09 mL/min — ABNORMAL LOW (ref >60.00)
Glucose, Bld: 96 mg/dL (ref 70–99)
Potassium: 4.1 mEq/L (ref 3.5–5.1)
Sodium: 136 mEq/L (ref 135–145)

## 2012-10-06 LAB — TSH: TSH: 3.37 u[IU]/mL (ref 0.35–5.50)

## 2012-10-06 LAB — HEMOGLOBIN A1C: Hgb A1c MFr Bld: 6 % (ref 4.6–6.5)

## 2012-10-06 LAB — LDL CHOLESTEROL, DIRECT: Direct LDL: 126.2 mg/dL

## 2012-10-06 LAB — LIPID PANEL
Cholesterol: 224 mg/dL — ABNORMAL HIGH (ref 0–200)
HDL: 76 mg/dL (ref >39.00)
Total CHOL/HDL Ratio: 3
Triglycerides: 93 mg/dL (ref 0.0–149.0)
VLDL: 18.6 mg/dL (ref 0.0–40.0)

## 2012-10-07 ENCOUNTER — Encounter: Payer: Self-pay | Admitting: Internal Medicine

## 2012-10-12 ENCOUNTER — Ambulatory Visit (INDEPENDENT_AMBULATORY_CARE_PROVIDER_SITE_OTHER): Payer: BC Managed Care – PPO

## 2012-10-12 DIAGNOSIS — Z23 Encounter for immunization: Secondary | ICD-10-CM

## 2012-11-07 ENCOUNTER — Encounter: Payer: Self-pay | Admitting: Internal Medicine

## 2012-11-09 ENCOUNTER — Telehealth: Payer: Self-pay | Admitting: Internal Medicine

## 2012-11-09 NOTE — Telephone Encounter (Signed)
Left mess for patient to call back to clarify what med is needed. I do not see Benicar on her med list. Only Bystolic. Need clarification.

## 2012-11-09 NOTE — Telephone Encounter (Signed)
11/09/2012   Pt left message in regards to the sample of Benicar they had;  Running low on sample and would like a script for Benicar to be sent to [facility] at [facility].

## 2012-11-09 NOTE — Telephone Encounter (Signed)
Pt called back to state she has been taking Benicar samples fine and wants Rx. Please advise.

## 2012-11-09 NOTE — Telephone Encounter (Signed)
40 mg or 20 mg? Thx

## 2012-11-11 MED ORDER — OLMESARTAN 10 MG HALF TABLET: 10.0000 mg | ORAL_TABLET | Freq: Every day | ORAL | Status: DC

## 2012-11-11 NOTE — Telephone Encounter (Signed)
Pt requests 10 mg. Rx for Benicar 10 mg sent to her pharmacy.

## 2012-11-15 ENCOUNTER — Other Ambulatory Visit: Payer: Self-pay | Admitting: *Deleted

## 2012-11-15 MED ORDER — OLMESARTAN MEDOXOMIL 20 MG PO TABS: 10.0000 mg | ORAL_TABLET | Freq: Every day | ORAL | Status: DC

## 2012-11-16 ENCOUNTER — Telehealth: Payer: Self-pay

## 2012-11-16 NOTE — Telephone Encounter (Signed)
Pa form form faxed to [facility] to # [phone]

## 2012-11-22 ENCOUNTER — Telehealth: Payer: Self-pay | Admitting: *Deleted

## 2012-11-22 ENCOUNTER — Encounter: Payer: Self-pay | Admitting: Internal Medicine

## 2012-11-22 NOTE — Telephone Encounter (Signed)
I called to initiate PA for Benicar 20 mg. Spoke to Courtney--PA is approved from 10/23/12 until 11/22/13. Pt and pharmacy informed. Case ID is [id number].

## 2012-11-23 ENCOUNTER — Ambulatory Visit: Payer: BC Managed Care – PPO | Admitting: Internal Medicine

## 2013-01-24 DIAGNOSIS — M9953 Intervertebral disc stenosis of neural canal of lumbar region: Secondary | ICD-10-CM | POA: Insufficient documentation

## 2013-05-02 ENCOUNTER — Ambulatory Visit: Payer: BC Managed Care – PPO | Admitting: Internal Medicine

## 2013-05-03 ENCOUNTER — Ambulatory Visit: Payer: BC Managed Care – PPO | Admitting: Internal Medicine

## 2013-05-10 ENCOUNTER — Ambulatory Visit (INDEPENDENT_AMBULATORY_CARE_PROVIDER_SITE_OTHER): Payer: BC Managed Care – PPO | Admitting: Internal Medicine

## 2013-05-10 ENCOUNTER — Encounter: Payer: Self-pay | Admitting: Internal Medicine

## 2013-05-10 VITALS — BP 122/68 | HR 82 | Temp 99.0°F | Ht 61.0 in | Wt 126.2 lb

## 2013-05-10 DIAGNOSIS — M949 Disorder of cartilage, unspecified: Secondary | ICD-10-CM

## 2013-05-10 DIAGNOSIS — J45909 Unspecified asthma, uncomplicated: Secondary | ICD-10-CM

## 2013-05-10 DIAGNOSIS — M899 Disorder of bone, unspecified: Secondary | ICD-10-CM

## 2013-05-10 DIAGNOSIS — I10 Essential (primary) hypertension: Secondary | ICD-10-CM

## 2013-05-10 DIAGNOSIS — Z23 Encounter for immunization: Secondary | ICD-10-CM

## 2013-05-10 MED ORDER — AZITHROMYCIN 250 MG PO TABS: ORAL_TABLET | ORAL | Status: DC

## 2013-05-10 MED ORDER — CIPROFLOXACIN HCL 500 MG PO TABS: 500.0000 mg | ORAL_TABLET | Freq: Two times a day (BID) | ORAL | Status: DC

## 2013-05-10 MED ORDER — PREDNISONE 10 MG PO TABS: ORAL_TABLET | ORAL | Status: DC

## 2013-05-10 NOTE — Assessment & Plan Note (Signed)
Continue with current prescription therapy as reflected on the Med list.  

## 2013-05-10 NOTE — Assessment & Plan Note (Signed)
Repeat BDS

## 2013-05-10 NOTE — Progress Notes (Signed)
   Subjective:     HPI  F/u TIA, HTN, abn Na on labs. Traveling to [place] this summer SBP 120-130s at home   BP Readings from Last 3 Encounters:  05/10/13 122/68  08/27/12 124/60  06/22/12 147/75   Wt Readings from Last 3 Encounters:  05/10/13 126 lb 4 oz (57.267 kg)  08/27/12 126 lb (57.153 kg)  06/22/12 127 lb (57.607 kg)     Review of Systems  Constitutional: Negative.  Negative for fever, chills, diaphoresis, activity change, appetite change, fatigue and unexpected weight change.  HENT: Negative for congestion, ear pain, facial swelling, hearing loss, mouth sores, nosebleeds, postnasal drip, rhinorrhea, sinus pressure, sneezing, sore throat, tinnitus and trouble swallowing.   Eyes: Negative for pain, discharge, redness, itching and visual disturbance.  Respiratory: Negative for cough, chest tightness, shortness of breath, wheezing and stridor.   Cardiovascular: Negative for chest pain, palpitations and leg swelling.  Gastrointestinal: Negative for nausea, diarrhea, constipation, blood in stool, abdominal distention, anal bleeding and rectal pain.  Genitourinary: Negative for dysuria, urgency, frequency, hematuria, flank pain, vaginal bleeding, vaginal discharge, difficulty urinating, genital sores and pelvic pain.  Musculoskeletal: Negative for arthralgias, back pain, gait problem, joint swelling, neck pain and neck stiffness.  Skin: Negative.  Negative for rash.  Neurological: Negative for dizziness, tremors, seizures, syncope, speech difficulty, weakness, numbness and headaches.  Hematological: Negative for adenopathy. Does not bruise/bleed easily.  Psychiatric/Behavioral: Negative for suicidal ideas, behavioral problems, sleep disturbance, dysphoric mood and decreased concentration. The patient is not nervous/anxious.        Objective:   Physical Exam  Constitutional: She appears well-developed. No distress.  HENT:  Head: Normocephalic.  Right Ear: External ear  normal.  Left Ear: External ear normal.  Nose: Nose normal.  Mouth/Throat: Oropharynx is clear and moist.  Eyes: Conjunctivae are normal. Pupils are equal, round, and reactive to light. Right eye exhibits no discharge. Left eye exhibits no discharge.  Neck: Normal range of motion. Neck supple. No JVD present. No tracheal deviation present. No thyromegaly present.  Cardiovascular: Normal rate, regular rhythm and normal heart sounds.   Pulmonary/Chest: No stridor. No respiratory distress. She has no wheezes.  Abdominal: Soft. Bowel sounds are normal. She exhibits no distension and no mass. There is no tenderness. There is no rebound and no guarding.  Musculoskeletal: She exhibits no edema and no tenderness.  Lymphadenopathy:    She has no cervical adenopathy.  Neurological: She displays normal reflexes. No cranial nerve deficit. She exhibits normal muscle tone. Coordination normal.  Skin: No rash noted. No erythema.  Psychiatric: She has a normal mood and affect. Her behavior is normal. Judgment and thought content normal.   Lab Results  Component Value Date   WBC 4.6 07/20/2010   HGB 12.8 07/20/2010   HCT 37.0 07/20/2010   PLT 221 07/20/2010   GLUCOSE 96 10/06/2012   CHOL 224* 10/06/2012   TRIG 93.0 10/06/2012   HDL 76.00 10/06/2012   LDLDIRECT 126.2 10/06/2012   LDLCALC 113* 07/21/2010   ALT 17 08/23/2012   AST 22 08/23/2012   NA 136 10/06/2012   K 4.1 10/06/2012   CL 103 10/06/2012   CREATININE 1.0 10/06/2012   BUN 22 10/06/2012   CO2 30 10/06/2012   TSH 3.37 10/06/2012   INR 0.95 07/20/2010   HGBA1C 6.0 10/06/2012          Assessment & Plan:

## 2013-05-10 NOTE — Assessment & Plan Note (Signed)
2015 well on Benicar

## 2013-05-10 NOTE — Progress Notes (Signed)
Pre visit review using our clinic review tool, if applicable. No additional management support is needed unless otherwise documented below in the visit note. 

## 2013-05-11 ENCOUNTER — Telehealth: Payer: Self-pay | Admitting: Internal Medicine

## 2013-05-11 NOTE — Telephone Encounter (Signed)
Relevant patient education assigned to patient using Emmi. ° °

## 2013-05-13 ENCOUNTER — Other Ambulatory Visit: Payer: Self-pay | Admitting: *Deleted

## 2013-05-13 ENCOUNTER — Encounter: Payer: Self-pay | Admitting: Internal Medicine

## 2013-05-13 MED ORDER — PIRBUTEROL ACETATE 200 MCG/INH IN AERB: 2.0000 | INHALATION_SPRAY | Freq: Four times a day (QID) | RESPIRATORY_TRACT | Status: DC | PRN

## 2013-06-15 ENCOUNTER — Encounter: Payer: Self-pay | Admitting: Internal Medicine

## 2013-06-15 ENCOUNTER — Telehealth: Payer: Self-pay | Admitting: *Deleted

## 2013-06-15 MED ORDER — PROMETHAZINE HCL 25 MG PO TABS: 25.0000 mg | ORAL_TABLET | ORAL | Status: DC | PRN

## 2013-06-15 NOTE — Telephone Encounter (Signed)
[name] has been on Benicar for >1 year: is this nausea new? Could it be due to something else, ie anxiety about the trip? Does she need nausea med She can try to take Benicar 1/4 tab a day (10 mg) Thx

## 2013-06-15 NOTE — Telephone Encounter (Signed)
Pt is requesting something for nausea.

## 2013-06-15 NOTE — Telephone Encounter (Signed)
Pt called states she is leaving for [place] on tomorrow however she has been nauseated for the last few days.  She believes it is due to the [place].  Please advise

## 2013-08-13 LAB — HM MAMMOGRAPHY

## 2013-08-23 ENCOUNTER — Other Ambulatory Visit: Payer: Self-pay | Admitting: *Deleted

## 2013-08-23 ENCOUNTER — Other Ambulatory Visit: Payer: Self-pay | Admitting: Internal Medicine

## 2013-08-23 MED ORDER — OLMESARTAN MEDOXOMIL 20 MG PO TABS: 10.0000 mg | ORAL_TABLET | Freq: Every day | ORAL | Status: DC

## 2013-09-17 ENCOUNTER — Encounter (HOSPITAL_COMMUNITY): Payer: Self-pay | Admitting: Emergency Medicine

## 2013-09-17 ENCOUNTER — Emergency Department (HOSPITAL_COMMUNITY)
Admission: EM | Admit: 2013-09-17 | Discharge: 2013-09-17 | Disposition: A | Discharge status: Home / Self Care | Payer: BC Managed Care – PPO | Source: Home / Self Care | Attending: Family Medicine | Admitting: Family Medicine

## 2013-09-17 DIAGNOSIS — W278XXA Contact with other nonpowered hand tool, initial encounter: Secondary | ICD-10-CM

## 2013-09-17 DIAGNOSIS — S81811A Laceration without foreign body, right lower leg, initial encounter: Secondary | ICD-10-CM

## 2013-09-17 DIAGNOSIS — S81009A Unspecified open wound, unspecified knee, initial encounter: Secondary | ICD-10-CM

## 2013-09-17 DIAGNOSIS — S91009A Unspecified open wound, unspecified ankle, initial encounter: Secondary | ICD-10-CM

## 2013-09-17 DIAGNOSIS — S81809A Unspecified open wound, unspecified lower leg, initial encounter: Secondary | ICD-10-CM

## 2013-09-17 MED ORDER — BACITRACIN ZINC 500 UNIT/GM EX OINT
TOPICAL_OINTMENT | CUTANEOUS | Status: AC
  Filled 2013-09-17: qty 2.7

## 2013-09-17 MED ORDER — TETANUS-DIPHTH-ACELL PERTUSSIS 5-2.5-18.5 LF-MCG/0.5 IM SUSP
INTRAMUSCULAR | Status: AC
  Filled 2013-09-17: qty 0.5

## 2013-09-17 MED ORDER — LIDOCAINE HCL 2 % IJ SOLN
INTRAMUSCULAR | Status: AC
  Filled 2013-09-17: qty 20

## 2013-09-17 NOTE — ED Notes (Signed)
Pt  Sustained  A  Laceration  To her  r  Lower   Leg     When  A  [name] on  Her  r lower leg  Tonight      Ambulated  To  Room  With a  Steady  Fluid  Gait

## 2013-09-17 NOTE — ED Provider Notes (Signed)
CSN: [id number]     Arrival date & time 09/17/13  1851 History   First MD Initiated Contact with Patient 09/17/13 1855     Chief Complaint  Patient presents with  . Extremity Laceration   (Consider location/radiation/quality/duration/timing/severity/associated sxs/prior Treatment) Patient is a 68 y.o. female presenting with skin laceration.  Laceration Location:  Leg Leg laceration location:  R lower leg Depth:  Cutaneous Quality: straight   Bleeding: controlled   Time since incident:  1 hour Injury mechanism: shears fell off shelf in garage and cut leg. Foreign body present:  No foreign bodies Tetanus status:  Out of date   Past Medical History  Diagnosis Date  . Osteopenia   . Allergic rhinitis     seasonal  . Asthma     -FeV1 106% 2006  . Sinusitis   . Urinary tract infection, site not specified   . Hypertension   . Lower back pain    Past Surgical History  Procedure Laterality Date  . Bunionectomy    . Knee arthroscopy      left  . Rotator cuff repair      right   Family History  Problem Relation Age of Onset  . Stroke Mother   . Cancer Mother 67    stomach  . Heart disease Mother   . Parkinsonism Father   . Glaucoma Father    History  Substance Use Topics  . Smoking status: Never Smoker   . Smokeless tobacco: Never Used  . Alcohol Use: 1.2 oz/week    2 Glasses of wine per week   OB History   Grav Para Term Preterm Abortions TAB SAB Ect Mult Living                 Review of Systems  Constitutional: Negative.   Musculoskeletal: Negative.   Skin: Positive for wound.    Allergies  Albuterol; Amlodipine; Codeine; Hctz; Hydromorphone hcl; and Losartan  Home Medications   Prior to Admission medications   Medication Sig Start Date End Date Taking? Authorizing Provider  aspirin 81 MG tablet Take 81 mg by mouth daily.      Historical Provider, MD  azithromycin (ZITHROMAX) 250 MG tablet As directed 05/10/13   Lew Dawes V, MD  calcium  carbonate (OS-CAL) 600 MG TABS Take 600 mg by mouth daily.      Historical Provider, MD  cetirizine (ZYRTEC) 10 MG tablet Take 10 mg by mouth as needed.     Historical Provider, MD  ciprofloxacin (CIPRO) 500 MG tablet Take 1 tablet (500 mg total) by mouth 2 (two) times daily. 05/10/13   Aleksei Plotnikov V, MD  cyclobenzaprine (FLEXERIL) 10 MG tablet Take 10 mg by mouth as needed. 11/27/10   Historical Provider, MD  estradiol (ESTRACE) 0.1 MG/GM vaginal cream Place 2 g vaginally 2 (two) times a week.      Historical Provider, MD  fish oil-omega-3 fatty acids 1000 MG capsule Take 2 g by mouth daily. 09/02/10 01/27/12  Aleksei Plotnikov V, MD  fluticasone (VERAMYST) 27.5 MCG/SPRAY nasal spray Place 2 sprays into the nose as needed. 08/18/11 08/17/12  Tammy S Parrett, NP  meloxicam (MOBIC) 7.5 MG tablet Take 1 tablet (7.5 mg total) by mouth daily as needed. 06/22/12   Stefanie Libel, MD  olmesartan (BENICAR) 20 MG tablet Take 0.5 tablets (10 mg total) by mouth daily. 08/23/13   [name], MD  Pediatric Multiple Vit-C-FA (MULTIVITAMIN ANIMAL SHAPES, WITH CA/FA,) WITH C & FA CHEW Chew 1  tablet by mouth daily.      Historical Provider, MD  pirbuterol (MAXAIR) 200 MCG/INH inhaler Inhale 2 puffs into the lungs 4 (four) times daily as needed. 05/13/13   Aleksei Plotnikov V, MD  predniSONE (DELTASONE) 10 MG tablet Prednisone 10 mg: take 4 tabs a day x 3 days; then 3 tabs a day x 4 days; then 2 tabs a day x 4 days, then 1 tab a day x 6 days, then stop. Take pc. 05/10/13   [name], MD  promethazine (PHENERGAN) 25 MG tablet Take 1 tablet (25 mg total) by mouth every 4 (four) hours as needed for nausea or vomiting. 06/15/13   Aleksei Plotnikov V, MD  QVAR 80 MCG/ACT inhaler USE 2 PUFFS BY MOUTH DAILY 09/25/12   Aleksei Plotnikov V, MD  traMADol (ULTRAM) 50 MG tablet Take 1 tablet by mouth as needed. pain 11/21/10   Historical Provider, MD   BP 138/63  Pulse 77  Temp(Src) 98.4 F (36.9 C) (Oral)  SpO2  97% Physical Exam  Nursing note and vitals reviewed. Constitutional: She is oriented to person, place, and time. She appears well-developed and well-nourished.  Musculoskeletal: She exhibits no tenderness.  Neurological: She is alert and oriented to person, place, and time.  Skin: Skin is warm and dry.  Right lower leg medial calf lac, flap.3.5 cm    ED Course  LACERATION REPAIR Date/Time: 09/17/2013 7:30 PM Performed by: Billy Fischer Authorized by: Ihor Gully D Consent: Verbal consent obtained. Risks and benefits: risks, benefits and alternatives were discussed Consent given by: patient Body area: lower extremity Location details: right lower leg Laceration length: 3.5 cm Tendon involvement: none Nerve involvement: none Vascular damage: no Anesthesia: local infiltration Local anesthetic: lidocaine 2% without epinephrine Patient sedated: no Preparation: Patient was prepped and draped in the usual sterile fashion. Irrigation solution: saline Amount of cleaning: standard Debridement: minimal Degree of undermining: none Skin closure: staples Number of sutures: 4 Technique: simple Approximation: close Approximation difficulty: simple Dressing: 4x4 sterile gauze and antibiotic ointment   (including critical care time) Labs Review Labs Reviewed - No data to display  Imaging Review No results found.   MDM   1. Leg laceration, right, initial encounter       Billy Fischer, MD 09/17/13 [phone]

## 2013-09-27 ENCOUNTER — Other Ambulatory Visit: Payer: BC Managed Care – PPO

## 2013-09-27 ENCOUNTER — Encounter (HOSPITAL_COMMUNITY): Payer: Self-pay | Admitting: Emergency Medicine

## 2013-09-27 ENCOUNTER — Emergency Department (INDEPENDENT_AMBULATORY_CARE_PROVIDER_SITE_OTHER)
Admission: EM | Admit: 2013-09-27 | Discharge: 2013-09-27 | Disposition: A | Discharge status: Home / Self Care | Payer: BC Managed Care – PPO | Source: Home / Self Care | Attending: Family Medicine | Admitting: Family Medicine

## 2013-09-27 DIAGNOSIS — Z4802 Encounter for removal of sutures: Secondary | ICD-10-CM

## 2013-09-27 NOTE — ED Notes (Signed)
Pt here for suture removal from right lower leg.  Reports mild pain.  Pt voices no other concerns.

## 2013-09-27 NOTE — Discharge Instructions (Signed)
Scar Minimization You will have a scar anytime you have surgery and a cut is made in the skin or you have something removed from your skin (mole, skin cancer, cyst). Although scars are unavoidable following surgery, there are ways to minimize their appearance. It is important to follow all the instructions you receive from your caregiver about wound care. How your wound heals will influence the appearance of your scar. If you do not follow the wound care instructions as directed, complications such as infection may occur. Wound instructions include keeping the wound clean, moist, and not letting the wound form a scab. Some people form scars that are raised and lumpy (hypertrophic) or larger than the initial wound (keloidal). HOME CARE INSTRUCTIONS   Follow wound care instructions as directed.  Keep the wound clean by washing it with soap and water.  Keep the wound moist with provided antibiotic cream or petroleum jelly until completely healed. Moisten twice a day for about 2 weeks.  Get stitches (sutures) taken out at the scheduled time.  Avoid touching or manipulating your wound unless needed. Wash your hands thoroughly before and after touching your wound.  Follow all restrictions such as limits on exercise or work. This depends on where your scar is located.  Keep the scar protected from sunburn. Cover the scar with sunscreen/sunblock with SPF 30 or higher.  Gently massage the scar using a circular motion to help minimize the appearance of the scar. Do this only after the wound has closed and all the sutures have been removed.  For hypertrophic or keloidal scars, there are several ways to treat and minimize their appearance. Methods include compression therapy, intralesional corticosteroids, laser therapy, or surgery. These methods are performed by your caregiver. Remember that the scar may appear lighter or darker than your normal skin color. This difference in color should even out with  time. SEEK MEDICAL CARE IF:   You have a fever.  You develop signs of infection such as pain, redness, pus, and warmth.  You have questions or concerns. Document Released: 06/19/2009 Document Revised: 03/24/2011 Document Reviewed: 06/19/2009 [facility] Patient Information 2015 [place], [place]. This information is not intended to replace advice given to you by your health care provider. Make sure you discuss any questions you have with your health care provider.  Incision Care An incision is when a surgeon cuts into your body tissues. After surgery, the incision needs to be cared for properly to prevent infection.  HOME CARE INSTRUCTIONS   Take all medicine as directed by your caregiver. Only take over-the-counter or prescription medicines for pain, discomfort, or fever as directed by your caregiver.  Do not remove your bandage (dressing) or get your incision wet until your surgeon gives you permission. In the event that your dressing becomes wet, dirty, or starts to smell, change the dressing and call your surgeon for instructions as soon as possible.  Take showers. Do not take tub baths, swim, or do anything that may soak the wound until it is healed.  Resume your normal diet and activities as directed or allowed.  Avoid lifting any weight until you are instructed otherwise.  Use anti-itch antihistamine medicine as directed by your caregiver. The wound may itch when it is healing. Do not pick or scratch at the wound.  Follow up with your caregiver for stitch (suture) or staple removal as directed.  Drink enough fluids to keep your urine clear or pale yellow. SEEK MEDICAL CARE IF:   You have redness, swelling, or  increasing pain in the wound that is not controlled with medicine.  You have drainage, blood, or pus coming from the wound that lasts longer than 1 day.  You develop muscle aches, chills, or a general ill feeling.  You notice a bad smell coming from the wound or  dressing.  Your wound edges separate after the sutures, staples, or skin adhesive strips have been removed.  You develop persistent nausea or vomiting. SEEK IMMEDIATE MEDICAL CARE IF:   You have a fever.  You develop a rash.  You develop dizzy episodes or faint while standing.  You have difficulty breathing.  You develop any reaction or side effects to medicine given. MAKE SURE YOU:   Understand these instructions.  Will watch your condition.  Will get help right away if you are not doing well or get worse. Document Released: 07/19/2004 Document Revised: 03/24/2011 Document Reviewed: 02/23/2013 [facility] Patient Information 2015 [place], [place]. This information is not intended to replace advice given to you by your health care provider. Make sure you discuss any questions you have with your health care provider.

## 2013-09-27 NOTE — ED Provider Notes (Signed)
CSN: [id number]     Arrival date & time 09/27/13  1034 History   First MD Initiated Contact with Patient 09/27/13 1109     Chief Complaint  Patient presents with  . Suture / Staple Removal   (Consider location/radiation/quality/duration/timing/severity/associated sxs/prior Treatment) HPI Comments: States she had a laceration repair at right lower leg 09/17/2013. Laceration occurred when gardening shears fell in garage and cut her right lower leg. Here for staple removal. Reports no issues with wound since repair.   Patient is a 68 y.o. female presenting with suture removal. The history is provided by the patient.  Suture / Staple Removal    Past Medical History  Diagnosis Date  . Osteopenia   . Allergic rhinitis     seasonal  . Asthma     -FeV1 106% 2006  . Sinusitis   . Urinary tract infection, site not specified   . Hypertension   . Lower back pain    Past Surgical History  Procedure Laterality Date  . Bunionectomy    . Knee arthroscopy      left  . Rotator cuff repair      right   Family History  Problem Relation Age of Onset  . Stroke Mother   . Cancer Mother 65    stomach  . Heart disease Mother   . Parkinsonism Father   . Glaucoma Father    History  Substance Use Topics  . Smoking status: Never Smoker   . Smokeless tobacco: Never Used  . Alcohol Use: 1.2 oz/week    2 Glasses of wine per week   OB History   Grav Para Term Preterm Abortions TAB SAB Ect Mult Living                 Review of Systems  All other systems reviewed and are negative.   Allergies  Albuterol; Amlodipine; Codeine; Hctz; Hydromorphone hcl; and Losartan  Home Medications   Prior to Admission medications   Medication Sig Start Date End Date Taking? Authorizing Provider  olmesartan (BENICAR) 20 MG tablet Take 0.5 tablets (10 mg total) by mouth daily. 08/23/13  Yes Aleksei Plotnikov V, MD  QVAR 80 MCG/ACT inhaler USE 2 PUFFS BY MOUTH DAILY 09/25/12  Yes Aleksei Plotnikov V, MD   aspirin 81 MG tablet Take 81 mg by mouth daily.      Historical Provider, MD  azithromycin (ZITHROMAX) 250 MG tablet As directed 05/10/13   Lew Dawes V, MD  calcium carbonate (OS-CAL) 600 MG TABS Take 600 mg by mouth daily.      Historical Provider, MD  cetirizine (ZYRTEC) 10 MG tablet Take 10 mg by mouth as needed.     Historical Provider, MD  ciprofloxacin (CIPRO) 500 MG tablet Take 1 tablet (500 mg total) by mouth 2 (two) times daily. 05/10/13   Aleksei Plotnikov V, MD  cyclobenzaprine (FLEXERIL) 10 MG tablet Take 10 mg by mouth as needed. 11/27/10   Historical Provider, MD  estradiol (ESTRACE) 0.1 MG/GM vaginal cream Place 2 g vaginally 2 (two) times a week.      Historical Provider, MD  fish oil-omega-3 fatty acids 1000 MG capsule Take 2 g by mouth daily. 09/02/10 01/27/12  Aleksei Plotnikov V, MD  fluticasone (VERAMYST) 27.5 MCG/SPRAY nasal spray Place 2 sprays into the nose as needed. 08/18/11 08/17/12  Tammy S Parrett, NP  meloxicam (MOBIC) 7.5 MG tablet Take 1 tablet (7.5 mg total) by mouth daily as needed. 06/22/12   Stefanie Libel, MD  Pediatric  Multiple Vit-C-FA (MULTIVITAMIN ANIMAL SHAPES, WITH CA/FA,) WITH C & FA CHEW Chew 1 tablet by mouth daily.      Historical Provider, MD  pirbuterol (MAXAIR) 200 MCG/INH inhaler Inhale 2 puffs into the lungs 4 (four) times daily as needed. 05/13/13   Aleksei Plotnikov V, MD  predniSONE (DELTASONE) 10 MG tablet Prednisone 10 mg: take 4 tabs a day x 3 days; then 3 tabs a day x 4 days; then 2 tabs a day x 4 days, then 1 tab a day x 6 days, then stop. Take pc. 05/10/13   [name], MD  promethazine (PHENERGAN) 25 MG tablet Take 1 tablet (25 mg total) by mouth every 4 (four) hours as needed for nausea or vomiting. 06/15/13   Aleksei Plotnikov V, MD  traMADol (ULTRAM) 50 MG tablet Take 1 tablet by mouth as needed. pain 11/21/10   Historical Provider, MD   BP 162/62  Pulse 73  Temp(Src) 98.1 F (36.7 C) (Oral)  Resp 12  SpO2 96% Physical Exam   Nursing note and vitals reviewed. Constitutional: She is oriented to person, place, and time. She appears well-developed and well-nourished. No distress.  HENT:  Head: Normocephalic and atraumatic.  Eyes: Conjunctivae are normal.  Cardiovascular: Normal rate.   Pulmonary/Chest: Effort normal.  Musculoskeletal: Normal range of motion.  Neurological: She is alert and oriented to person, place, and time.  Skin: Skin is warm and dry.  Healing wound right lower leg with 4 staples in place. No clinical signs of infection.   Psychiatric: She has a normal mood and affect. Her behavior is normal.    ED Course  Procedures (including critical care time) Labs Review Labs Reviewed - No data to display  Imaging Review No results found.   MDM   1. Encounter for staple removal    Staples x 4 removed from wound without incident. 4 steri strips placed over wound to provide stability as wound continues to heal. Follow up prn.     Lutricia Feil, PA 09/27/13 1143

## 2013-09-30 NOTE — ED Provider Notes (Signed)
Medical screening examination/treatment/procedure(s) were performed by resident physician or non-physician practitioner and as supervising physician I was immediately available for consultation/collaboration.   Pauline Good MD.   Billy Fischer, MD 09/30/13 [phone]

## 2013-10-03 ENCOUNTER — Other Ambulatory Visit: Payer: BC Managed Care – PPO

## 2013-10-03 ENCOUNTER — Other Ambulatory Visit: Payer: Self-pay | Admitting: *Deleted

## 2013-10-03 ENCOUNTER — Ambulatory Visit: Payer: BC Managed Care – PPO | Admitting: Internal Medicine

## 2013-10-03 DIAGNOSIS — Z0000 Encounter for general adult medical examination without abnormal findings: Secondary | ICD-10-CM

## 2013-10-05 ENCOUNTER — Encounter: Payer: Self-pay | Admitting: Internal Medicine

## 2013-10-10 ENCOUNTER — Ambulatory Visit: Payer: BC Managed Care – PPO | Admitting: Internal Medicine

## 2013-10-11 ENCOUNTER — Ambulatory Visit (INDEPENDENT_AMBULATORY_CARE_PROVIDER_SITE_OTHER): Payer: BC Managed Care – PPO | Admitting: *Deleted

## 2013-10-11 DIAGNOSIS — Z23 Encounter for immunization: Secondary | ICD-10-CM

## 2013-11-03 ENCOUNTER — Other Ambulatory Visit: Payer: Self-pay | Admitting: Internal Medicine

## 2013-11-07 ENCOUNTER — Telehealth: Payer: Self-pay | Admitting: Internal Medicine

## 2013-11-07 DIAGNOSIS — I10 Essential (primary) hypertension: Secondary | ICD-10-CM

## 2013-11-07 DIAGNOSIS — Z0000 Encounter for general adult medical examination without abnormal findings: Secondary | ICD-10-CM

## 2013-11-07 NOTE — Telephone Encounter (Signed)
Notified pt with md response. Labs already place...[name]

## 2013-11-07 NOTE — Telephone Encounter (Signed)
Ok lipids, CMET, TSH, CBC, UA Dx: wellness, HTN Thx

## 2013-11-07 NOTE — Telephone Encounter (Signed)
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  I explained why they are not done before.  She still insist on having them done prior.  She is requesting a follow up call to notify her that labs are entered.

## 2013-11-08 ENCOUNTER — Other Ambulatory Visit (INDEPENDENT_AMBULATORY_CARE_PROVIDER_SITE_OTHER): Payer: BC Managed Care – PPO

## 2013-11-08 DIAGNOSIS — I10 Essential (primary) hypertension: Secondary | ICD-10-CM

## 2013-11-08 DIAGNOSIS — Z0000 Encounter for general adult medical examination without abnormal findings: Secondary | ICD-10-CM

## 2013-11-08 DIAGNOSIS — Z0189 Encounter for other specified special examinations: Secondary | ICD-10-CM

## 2013-11-08 LAB — CBC WITH DIFFERENTIAL/PLATELET
Basophils Absolute: 0.1 10*3/uL (ref 0.0–0.1)
Basophils Relative: 1.3 % (ref 0.0–3.0)
Eosinophils Absolute: 0.2 10*3/uL (ref 0.0–0.7)
Eosinophils Relative: 3.9 % (ref 0.0–5.0)
HCT: 38.5 % (ref 36.0–46.0)
Hemoglobin: 12.6 g/dL (ref 12.0–15.0)
Lymphocytes Relative: 42.6 % (ref 12.0–46.0)
Lymphs Abs: 2.1 10*3/uL (ref 0.7–4.0)
MCHC: 32.8 g/dL (ref 30.0–36.0)
MCV: 93.6 fl (ref 78.0–100.0)
Monocytes Absolute: 0.6 10*3/uL (ref 0.1–1.0)
Monocytes Relative: 12 % (ref 3.0–12.0)
Neutro Abs: 2 10*3/uL (ref 1.4–7.7)
Neutrophils Relative %: 40.2 % — ABNORMAL LOW (ref 43.0–77.0)
Platelets: 271 10*3/uL (ref 150.0–400.0)
RBC: 4.11 Mil/uL (ref 3.87–5.11)
RDW: 13.9 % (ref 11.5–15.5)
WBC: 4.9 10*3/uL (ref 4.0–10.5)

## 2013-11-08 LAB — URINALYSIS, ROUTINE W REFLEX MICROSCOPIC
Bilirubin Urine: NEGATIVE
Hgb urine dipstick: NEGATIVE
Ketones, ur: NEGATIVE
Leukocytes, UA: NEGATIVE
Nitrite: NEGATIVE
RBC / HPF: NONE SEEN (ref 0–?)
Specific Gravity, Urine: 1.01 (ref 1.000–1.030)
Total Protein, Urine: NEGATIVE
Urine Glucose: NEGATIVE
Urobilinogen, UA: 0.2 (ref 0.0–1.0)
pH: 7 (ref 5.0–8.0)

## 2013-11-08 LAB — BASIC METABOLIC PANEL
BUN: 19 mg/dL (ref 6–23)
CO2: 27 mEq/L (ref 19–32)
Calcium: 9.3 mg/dL (ref 8.4–10.5)
Chloride: 105 mEq/L (ref 96–112)
Creatinine, Ser: 1 mg/dL (ref 0.4–1.2)
GFR: 59.58 mL/min — ABNORMAL LOW (ref >60.00)
Glucose, Bld: 92 mg/dL (ref 70–99)
Potassium: 4.4 mEq/L (ref 3.5–5.1)
Sodium: 139 mEq/L (ref 135–145)

## 2013-11-08 LAB — LIPID PANEL
Cholesterol: 249 mg/dL — ABNORMAL HIGH (ref 0–200)
HDL: 76.3 mg/dL (ref >39.00)
LDL Cholesterol: 152 mg/dL — ABNORMAL HIGH (ref 0–99)
NonHDL: 172.7
Total CHOL/HDL Ratio: 3
Triglycerides: 103 mg/dL (ref 0.0–149.0)
VLDL: 20.6 mg/dL (ref 0.0–40.0)

## 2013-11-08 LAB — HEPATIC FUNCTION PANEL
ALT: 20 U/L (ref 0–35)
AST: 20 U/L (ref 0–37)
Albumin: 3.5 g/dL (ref 3.5–5.2)
Alkaline Phosphatase: 44 U/L (ref 39–117)
Bilirubin, Direct: 0.1 mg/dL (ref 0.0–0.3)
Total Bilirubin: 0.8 mg/dL (ref 0.2–1.2)
Total Protein: 7 g/dL (ref 6.0–8.3)

## 2013-11-08 LAB — TSH: TSH: 4.13 u[IU]/mL (ref 0.35–4.50)

## 2013-11-09 ENCOUNTER — Ambulatory Visit (INDEPENDENT_AMBULATORY_CARE_PROVIDER_SITE_OTHER): Payer: BC Managed Care – PPO | Admitting: Internal Medicine

## 2013-11-09 ENCOUNTER — Encounter: Payer: Self-pay | Admitting: Internal Medicine

## 2013-11-09 VITALS — BP 110/68 | HR 69 | Temp 98.3°F | Wt 127.0 lb

## 2013-11-09 DIAGNOSIS — Z0000 Encounter for general adult medical examination without abnormal findings: Secondary | ICD-10-CM

## 2013-11-09 DIAGNOSIS — Z91013 Allergy to seafood: Secondary | ICD-10-CM

## 2013-11-09 DIAGNOSIS — E785 Hyperlipidemia, unspecified: Secondary | ICD-10-CM

## 2013-11-09 DIAGNOSIS — M810 Age-related osteoporosis without current pathological fracture: Secondary | ICD-10-CM

## 2013-11-09 DIAGNOSIS — G459 Transient cerebral ischemic attack, unspecified: Secondary | ICD-10-CM

## 2013-11-09 DIAGNOSIS — I10 Essential (primary) hypertension: Secondary | ICD-10-CM

## 2013-11-09 HISTORY — DX: Allergy to seafood: Z91.013

## 2013-11-09 MED ORDER — ALBUTEROL SULFATE 108 (90 BASE) MCG/ACT IN AEPB: 1.0000 | INHALATION_SPRAY | Freq: Four times a day (QID) | RESPIRATORY_TRACT | Status: DC | PRN

## 2013-11-09 MED ORDER — OLMESARTAN MEDOXOMIL 20 MG PO TABS
10.0000 mg | ORAL_TABLET | Freq: Every day | ORAL | Status: DC
Start: 2013-11-09 — End: 2014-05-01

## 2013-11-09 MED ORDER — PSEUDOEPHEDRINE HCL 30 MG PO TABS: 60.0000 mg | ORAL_TABLET | ORAL | Status: DC | PRN

## 2013-11-09 MED ORDER — PREDNISONE 10 MG PO TABS: ORAL_TABLET | ORAL | Status: DC

## 2013-11-09 MED ORDER — EPINEPHRINE 0.3 MG/0.3ML IJ SOAJ: INTRAMUSCULAR | Status: DC

## 2013-11-09 NOTE — Addendum Note (Signed)
Addended by: [name] on: 11/09/2013 09:05 PM   Modules accepted: Orders

## 2013-11-09 NOTE — Assessment & Plan Note (Signed)
Continue with current prescription therapy as reflected on the Med list.  

## 2013-11-09 NOTE — Progress Notes (Deleted)
Pre visit review using our clinic review tool, if applicable. No additional management support is needed unless otherwise documented below in the visit note. 

## 2013-11-09 NOTE — Assessment & Plan Note (Signed)
Mild. No CAD/PVD. Options to treat vs not to treat discussed

## 2013-11-09 NOTE — Assessment & Plan Note (Signed)
No relapse 

## 2013-11-09 NOTE — Patient Instructions (Signed)
Epi-pen  Ziplock bag with: Benadryl 50 mg Sudafed 2 tabs Prednisone 40 mg

## 2013-11-09 NOTE — Progress Notes (Signed)
   Subjective:     HPI  F/u TIA, HTN, dyslipidemia   BP Readings from Last 3 Encounters:  11/09/13 110/68  09/27/13 162/62  09/17/13 138/63   Wt Readings from Last 3 Encounters:  11/09/13 127 lb (57.607 kg)  05/10/13 126 lb 4 oz (57.267 kg)  08/27/12 126 lb (57.153 kg)     Review of Systems  Constitutional: Negative.  Negative for fever, chills, diaphoresis, activity change, appetite change, fatigue and unexpected weight change.  HENT: Negative for congestion, ear pain, facial swelling, hearing loss, mouth sores, nosebleeds, postnasal drip, rhinorrhea, sinus pressure, sneezing, sore throat, tinnitus and trouble swallowing.   Eyes: Negative for pain, discharge, redness, itching and visual disturbance.  Respiratory: Negative for cough, chest tightness, shortness of breath, wheezing and stridor.   Cardiovascular: Negative for chest pain, palpitations and leg swelling.  Gastrointestinal: Negative for nausea, diarrhea, constipation, blood in stool, abdominal distention, anal bleeding and rectal pain.  Genitourinary: Negative for dysuria, urgency, frequency, hematuria, flank pain, vaginal bleeding, vaginal discharge, difficulty urinating, genital sores and pelvic pain.  Musculoskeletal: Negative for arthralgias, back pain, gait problem, joint swelling, neck pain and neck stiffness.  Skin: Negative.  Negative for rash.  Neurological: Negative for dizziness, tremors, seizures, syncope, speech difficulty, weakness, numbness and headaches.  Hematological: Negative for adenopathy. Does not bruise/bleed easily.  Psychiatric/Behavioral: Negative for suicidal ideas, behavioral problems, sleep disturbance, dysphoric mood and decreased concentration. The patient is not nervous/anxious.        Objective:   Physical Exam  Constitutional: She appears well-developed. No distress.  HENT:  Head: Normocephalic.  Right Ear: External ear normal.  Left Ear: External ear normal.  Nose: Nose normal.   Mouth/Throat: Oropharynx is clear and moist.  Eyes: Conjunctivae are normal. Pupils are equal, round, and reactive to light. Right eye exhibits no discharge. Left eye exhibits no discharge.  Neck: Normal range of motion. Neck supple. No JVD present. No tracheal deviation present. No thyromegaly present.  Cardiovascular: Normal rate, regular rhythm and normal heart sounds.   Pulmonary/Chest: No stridor. No respiratory distress. She has no wheezes.  Abdominal: Soft. Bowel sounds are normal. She exhibits no distension and no mass. There is no tenderness. There is no rebound and no guarding.  Musculoskeletal: She exhibits no edema and no tenderness.  Lymphadenopathy:    She has no cervical adenopathy.  Neurological: She displays normal reflexes. No cranial nerve deficit. She exhibits normal muscle tone. Coordination normal.  Skin: No rash noted. No erythema.  Psychiatric: She has a normal mood and affect. Her behavior is normal. Judgment and thought content normal.   Lab Results  Component Value Date   WBC 4.9 11/08/2013   HGB 12.6 11/08/2013   HCT 38.5 11/08/2013   PLT 271.0 11/08/2013   GLUCOSE 92 11/08/2013   CHOL 249* 11/08/2013   TRIG 103.0 11/08/2013   HDL 76.30 11/08/2013   LDLDIRECT 126.2 10/06/2012   LDLCALC 152* 11/08/2013   ALT 20 11/08/2013   AST 20 11/08/2013   NA 139 11/08/2013   K 4.4 11/08/2013   CL 105 11/08/2013   CREATININE 1.0 11/08/2013   BUN 19 11/08/2013   CO2 27 11/08/2013   TSH 4.13 11/08/2013   INR 0.95 07/20/2010   HGBA1C 6.0 10/06/2012          Assessment & Plan:

## 2013-11-09 NOTE — Assessment & Plan Note (Signed)
Epi-pen  Ziplock bag with: Benadryl 50 mg Sudafed 2 tabs Prednisone 40 mg

## 2013-11-25 ENCOUNTER — Telehealth: Payer: Self-pay | Admitting: *Deleted

## 2013-11-25 NOTE — Telephone Encounter (Signed)
Benicar 20 mg PA is approved  from 10/26/13 to 11/25/14. Case ID [id number]. Pt will be informed. I informed pharmacy.

## 2013-11-29 ENCOUNTER — Ambulatory Visit (INDEPENDENT_AMBULATORY_CARE_PROVIDER_SITE_OTHER)
Admission: RE | Admit: 2013-11-29 | Discharge: 2013-11-29 | Disposition: A | Discharge status: Home / Self Care | Payer: BC Managed Care – PPO | Source: Ambulatory Visit | Attending: Internal Medicine | Admitting: Internal Medicine

## 2013-11-29 DIAGNOSIS — M810 Age-related osteoporosis without current pathological fracture: Secondary | ICD-10-CM

## 2014-05-01 ENCOUNTER — Encounter: Payer: Self-pay | Admitting: Internal Medicine

## 2014-05-01 ENCOUNTER — Ambulatory Visit (INDEPENDENT_AMBULATORY_CARE_PROVIDER_SITE_OTHER): Payer: BC Managed Care – PPO | Admitting: Internal Medicine

## 2014-05-01 VITALS — BP 170/86 | HR 70 | Ht 61.0 in | Wt 130.0 lb

## 2014-05-01 DIAGNOSIS — Z9189 Other specified personal risk factors, not elsewhere classified: Secondary | ICD-10-CM

## 2014-05-01 DIAGNOSIS — Z7189 Other specified counseling: Secondary | ICD-10-CM | POA: Diagnosis not present

## 2014-05-01 DIAGNOSIS — Z283 Underimmunization status: Secondary | ICD-10-CM

## 2014-05-01 DIAGNOSIS — Z111 Encounter for screening for respiratory tuberculosis: Secondary | ICD-10-CM

## 2014-05-01 DIAGNOSIS — Z0000 Encounter for general adult medical examination without abnormal findings: Secondary | ICD-10-CM

## 2014-05-01 DIAGNOSIS — R197 Diarrhea, unspecified: Secondary | ICD-10-CM | POA: Diagnosis not present

## 2014-05-01 DIAGNOSIS — I10 Essential (primary) hypertension: Secondary | ICD-10-CM | POA: Diagnosis not present

## 2014-05-01 DIAGNOSIS — Z2839 Other underimmunization status: Secondary | ICD-10-CM

## 2014-05-01 DIAGNOSIS — Z7184 Encounter for health counseling related to travel: Secondary | ICD-10-CM

## 2014-05-01 HISTORY — DX: Diarrhea, unspecified: R19.7

## 2014-05-01 MED ORDER — IRBESARTAN 150 MG PO TABS: 150.0000 mg | ORAL_TABLET | Freq: Every day | ORAL | Status: DC

## 2014-05-01 MED ORDER — AZITHROMYCIN 250 MG PO TABS: ORAL_TABLET | ORAL | Status: DC

## 2014-05-01 MED ORDER — PREDNISONE 10 MG PO TABS: ORAL_TABLET | ORAL | Status: DC

## 2014-05-01 MED ORDER — EPINEPHRINE 0.3 MG/0.3ML IJ SOAJ: INTRAMUSCULAR | Status: DC

## 2014-05-01 NOTE — Assessment & Plan Note (Signed)
05/2014 - [place] [facility]

## 2014-05-01 NOTE — Progress Notes (Signed)
Pre visit review using our clinic review tool, if applicable. No additional management support is needed unless otherwise documented below in the visit note. 

## 2014-05-01 NOTE — Assessment & Plan Note (Signed)
Diarrhea, possibly caused by Benicar (?) - resolved off Rx We can try Avapro

## 2014-05-01 NOTE — Progress Notes (Signed)
Subjective:     HPI  The patient is here for a wellness exam. The patient has been doing well overall without major physical or psychological issues going on lately. C/o having diarrhea duet to Benicar (?). F/u TIA, HTN, abn Na on labs SBP 120-130s at home [name] is going to [place] in May   BP Readings from Last 3 Encounters:  05/01/14 170/86  11/09/13 110/68  09/27/13 162/62    Wt Readings from Last 3 Encounters:  05/01/14 130 lb (58.968 kg)  11/09/13 127 lb (57.607 kg)  05/10/13 126 lb 4 oz (57.267 kg)      Review of Systems  Constitutional: Negative.  Negative for fever, chills, diaphoresis, activity change, appetite change, fatigue and unexpected weight change.  HENT: Negative for congestion, ear pain, facial swelling, hearing loss, mouth sores, nosebleeds, postnasal drip, rhinorrhea, sinus pressure, sneezing, sore throat, tinnitus and trouble swallowing.   Eyes: Negative for pain, discharge, redness, itching and visual disturbance.  Respiratory: Negative for cough, chest tightness, shortness of breath, wheezing and stridor.   Cardiovascular: Negative for chest pain, palpitations and leg swelling.  Gastrointestinal: Negative for nausea, diarrhea, constipation, blood in stool, abdominal distention, anal bleeding and rectal pain.  Genitourinary: Negative for dysuria, urgency, frequency, hematuria, flank pain, vaginal bleeding, vaginal discharge, difficulty urinating, genital sores and pelvic pain.  Musculoskeletal: Negative for back pain, joint swelling, arthralgias, gait problem, neck pain and neck stiffness.  Skin: Negative.  Negative for rash.  Neurological: Negative for dizziness, tremors, seizures, syncope, speech difficulty, weakness, numbness and headaches.  Hematological: Negative for adenopathy. Does not bruise/bleed easily.  Psychiatric/Behavioral: Negative for suicidal ideas, behavioral problems, sleep disturbance, dysphoric mood and decreased concentration. The  patient is not nervous/anxious.        Objective:   Physical Exam  Constitutional: She appears well-developed. No distress.  HENT:  Head: Normocephalic.  Right Ear: External ear normal.  Left Ear: External ear normal.  Nose: Nose normal.  Mouth/Throat: Oropharynx is clear and moist.  Eyes: Conjunctivae are normal. Pupils are equal, round, and reactive to light. Right eye exhibits no discharge. Left eye exhibits no discharge.  Neck: Normal range of motion. Neck supple. No JVD present. No tracheal deviation present. No thyromegaly present.  Cardiovascular: Normal rate, regular rhythm and normal heart sounds.   Pulmonary/Chest: No stridor. No respiratory distress. She has no wheezes.  Abdominal: Soft. Bowel sounds are normal. She exhibits no distension and no mass. There is no tenderness. There is no rebound and no guarding.  Musculoskeletal: She exhibits no edema or tenderness.  Lymphadenopathy:    She has no cervical adenopathy.  Neurological: She displays normal reflexes. No cranial nerve deficit. She exhibits normal muscle tone. Coordination normal.  Skin: No rash noted. No erythema.  Psychiatric: She has a normal mood and affect. Her behavior is normal. Judgment and thought content normal.   Lab Results  Component Value Date   WBC 4.9 11/08/2013   HGB 12.6 11/08/2013   HCT 38.5 11/08/2013   PLT 271.0 11/08/2013   GLUCOSE 92 11/08/2013   CHOL 249* 11/08/2013   TRIG 103.0 11/08/2013   HDL 76.30 11/08/2013   LDLDIRECT 126.2 10/06/2012   LDLCALC 152* 11/08/2013   ALT 20 11/08/2013   AST 20 11/08/2013   NA 139 11/08/2013   K 4.4 11/08/2013   CL 105 11/08/2013   CREATININE 1.0 11/08/2013   BUN 19 11/08/2013   CO2 27 11/08/2013   TSH 4.13 11/08/2013   INR 0.95 07/20/2010  HGBA1C 6.0 10/06/2012          Assessment & Plan:

## 2014-05-01 NOTE — Assessment & Plan Note (Signed)
We discussed age appropriate health related issues, including available/recomended screening tests and vaccinations. We discussed a need for adhering to healthy diet and exercise. Labs/EKG were reviewed/ordered. All questions were answered.   

## 2014-05-03 ENCOUNTER — Other Ambulatory Visit (INDEPENDENT_AMBULATORY_CARE_PROVIDER_SITE_OTHER): Payer: BC Managed Care – PPO

## 2014-05-03 ENCOUNTER — Encounter: Payer: Self-pay | Admitting: *Deleted

## 2014-05-03 DIAGNOSIS — Z0000 Encounter for general adult medical examination without abnormal findings: Secondary | ICD-10-CM | POA: Diagnosis not present

## 2014-05-03 DIAGNOSIS — Z283 Underimmunization status: Secondary | ICD-10-CM

## 2014-05-03 DIAGNOSIS — Z9189 Other specified personal risk factors, not elsewhere classified: Secondary | ICD-10-CM

## 2014-05-03 DIAGNOSIS — Z2839 Other underimmunization status: Secondary | ICD-10-CM

## 2014-05-03 LAB — TSH: TSH: 3.75 u[IU]/mL (ref 0.35–4.50)

## 2014-05-03 LAB — OB RESULTS CONSOLE TB SKIN TEST
Induration: 0 mm
TB Skin Test: NEGATIVE

## 2014-05-03 LAB — URINALYSIS
Bilirubin Urine: NEGATIVE
Hgb urine dipstick: NEGATIVE
Ketones, ur: NEGATIVE
Leukocytes, UA: NEGATIVE
Nitrite: NEGATIVE
Specific Gravity, Urine: 1.015 (ref 1.000–1.030)
Total Protein, Urine: NEGATIVE
Urine Glucose: NEGATIVE
Urobilinogen, UA: 0.2 (ref 0.0–1.0)
pH: 7 (ref 5.0–8.0)

## 2014-05-03 LAB — CBC WITH DIFFERENTIAL/PLATELET
Basophils Absolute: 0.1 10*3/uL (ref 0.0–0.1)
Basophils Relative: 1.4 % (ref 0.0–3.0)
Eosinophils Absolute: 0.2 10*3/uL (ref 0.0–0.7)
Eosinophils Relative: 4.7 % (ref 0.0–5.0)
HCT: 36.5 % (ref 36.0–46.0)
Hemoglobin: 12.4 g/dL (ref 12.0–15.0)
Lymphocytes Relative: 49.1 % — ABNORMAL HIGH (ref 12.0–46.0)
Lymphs Abs: 2 10*3/uL (ref 0.7–4.0)
MCHC: 34.1 g/dL (ref 30.0–36.0)
MCV: 90.7 fl (ref 78.0–100.0)
Monocytes Absolute: 0.5 10*3/uL (ref 0.1–1.0)
Monocytes Relative: 13.6 % — ABNORMAL HIGH (ref 3.0–12.0)
Neutro Abs: 1.3 10*3/uL — ABNORMAL LOW (ref 1.4–7.7)
Neutrophils Relative %: 31.2 % — ABNORMAL LOW (ref 43.0–77.0)
Platelets: 241 10*3/uL (ref 150.0–400.0)
RBC: 4.02 Mil/uL (ref 3.87–5.11)
RDW: 13.3 % (ref 11.5–15.5)
WBC: 4.1 10*3/uL (ref 4.0–10.5)

## 2014-05-03 LAB — BASIC METABOLIC PANEL WITH GFR
BUN: 18 mg/dL (ref 6–23)
CO2: 28 meq/L (ref 19–32)
Calcium: 9.1 mg/dL (ref 8.4–10.5)
Chloride: 106 meq/L (ref 96–112)
Creatinine, Ser: 1.05 mg/dL (ref 0.40–1.20)
GFR: 55.59 mL/min — ABNORMAL LOW
Glucose, Bld: 90 mg/dL (ref 70–99)
Potassium: 4 meq/L (ref 3.5–5.1)
Sodium: 140 meq/L (ref 135–145)

## 2014-05-03 LAB — HEPATIC FUNCTION PANEL
ALT: 15 U/L (ref 0–35)
AST: 19 U/L (ref 0–37)
Albumin: 4 g/dL (ref 3.5–5.2)
Alkaline Phosphatase: 41 U/L (ref 39–117)
Bilirubin, Direct: 0.1 mg/dL (ref 0.0–0.3)
Total Bilirubin: 0.5 mg/dL (ref 0.2–1.2)
Total Protein: 6.7 g/dL (ref 6.0–8.3)

## 2014-05-03 LAB — LIPID PANEL
Cholesterol: 241 mg/dL — ABNORMAL HIGH (ref 0–200)
HDL: 78.3 mg/dL
LDL Cholesterol: 143 mg/dL — ABNORMAL HIGH (ref 0–99)
NonHDL: 162.7
Total CHOL/HDL Ratio: 3
Triglycerides: 97 mg/dL (ref 0.0–149.0)
VLDL: 19.4 mg/dL (ref 0.0–40.0)

## 2014-05-04 LAB — VARICELLA ZOSTER ANTIBODY, IGG: Varicella IgG: 750.7 Index — ABNORMAL HIGH (ref <135.00)

## 2014-05-04 LAB — HEPATITIS B SURFACE ANTIBODY,QUALITATIVE: Hep B S Ab: POSITIVE — AB

## 2014-07-31 ENCOUNTER — Ambulatory Visit: Payer: BC Managed Care – PPO | Admitting: Internal Medicine

## 2014-10-26 ENCOUNTER — Ambulatory Visit (INDEPENDENT_AMBULATORY_CARE_PROVIDER_SITE_OTHER): Payer: 59

## 2014-10-26 ENCOUNTER — Ambulatory Visit: Payer: BC Managed Care – PPO

## 2014-10-26 DIAGNOSIS — Z23 Encounter for immunization: Secondary | ICD-10-CM

## 2014-11-29 ENCOUNTER — Other Ambulatory Visit: Payer: Self-pay | Admitting: Internal Medicine

## 2014-12-10 ENCOUNTER — Other Ambulatory Visit: Payer: Self-pay | Admitting: Internal Medicine

## 2015-01-27 ENCOUNTER — Other Ambulatory Visit: Payer: Self-pay | Admitting: Internal Medicine

## 2015-04-24 ENCOUNTER — Telehealth: Payer: Self-pay | Admitting: Pulmonary Disease

## 2015-04-24 ENCOUNTER — Institutional Professional Consult (permissible substitution): Payer: 59 | Admitting: Pulmonary Disease

## 2015-04-24 ENCOUNTER — Encounter: Payer: Self-pay | Admitting: Pulmonary Disease

## 2015-04-24 ENCOUNTER — Ambulatory Visit (INDEPENDENT_AMBULATORY_CARE_PROVIDER_SITE_OTHER): Payer: 59 | Admitting: Pulmonary Disease

## 2015-04-24 ENCOUNTER — Ambulatory Visit (INDEPENDENT_AMBULATORY_CARE_PROVIDER_SITE_OTHER)
Admission: RE | Admit: 2015-04-24 | Discharge: 2015-04-24 | Disposition: A | Discharge status: Home / Self Care | Payer: 59 | Source: Ambulatory Visit | Attending: Pulmonary Disease | Admitting: Pulmonary Disease

## 2015-04-24 VITALS — BP 152/74 | HR 78 | Ht 61.0 in | Wt 128.0 lb

## 2015-04-24 DIAGNOSIS — J45909 Unspecified asthma, uncomplicated: Secondary | ICD-10-CM

## 2015-04-24 DIAGNOSIS — R05 Cough: Secondary | ICD-10-CM | POA: Diagnosis not present

## 2015-04-24 DIAGNOSIS — R059 Cough, unspecified: Secondary | ICD-10-CM

## 2015-04-24 DIAGNOSIS — J4531 Mild persistent asthma with (acute) exacerbation: Secondary | ICD-10-CM

## 2015-04-24 HISTORY — DX: Unspecified asthma, uncomplicated: J45.909

## 2015-04-24 MED ORDER — IPRATROPIUM BROMIDE HFA 17 MCG/ACT IN AERS: 2.0000 | INHALATION_SPRAY | RESPIRATORY_TRACT | Status: DC | PRN

## 2015-04-24 MED ORDER — AZITHROMYCIN 250 MG PO TABS: ORAL_TABLET | ORAL | Status: DC

## 2015-04-24 MED ORDER — BECLOMETHASONE DIPROPIONATE 80 MCG/ACT IN AERS: 2.0000 | INHALATION_SPRAY | Freq: Two times a day (BID) | RESPIRATORY_TRACT | Status: DC

## 2015-04-24 MED ORDER — PREDNISONE 10 MG PO TABS: ORAL_TABLET | ORAL | Status: DC

## 2015-04-24 MED ORDER — UMECLIDINIUM-VILANTEROL 62.5-25 MCG/INH IN AEPB: 1.0000 | INHALATION_SPRAY | Freq: Every day | RESPIRATORY_TRACT | Status: DC

## 2015-04-24 MED ORDER — PIRBUTEROL ACETATE 200 MCG/INH IN AERB
2.0000 | INHALATION_SPRAY | RESPIRATORY_TRACT | Status: DC | PRN
Start: 2015-04-24 — End: 2015-12-11

## 2015-04-24 NOTE — Telephone Encounter (Signed)
Can we prescribe Atrovent HFA, 2 puffs every 4 hrs as needed for dyspnea if she is not allergic to it. She gets anaphylactic shock with albuterol.  Thanks.  AD

## 2015-04-24 NOTE — Telephone Encounter (Signed)
Pt was here today for an appointment with AD. She was prescribed Maxair. This is no longer on the market. An alternative will have to be chosen.  AD - please advise. Thanks.

## 2015-04-24 NOTE — Progress Notes (Signed)
Subjective:    Patient ID: [name], female    DOB: 1945/10/26, 70 y.o.   MRN: [id number]  HPI  This the case of [name], 70 y.o. Female, who is here at the office for acute SOB.  She was last seen at this office in 2013.    As you very well know, patient is dxed with asthma. Dxed when she was in her 36s. Her asthma flares up with resp infxn. She uses Qvar, 80 g per puff, 2 puffs twice a day. Her asthma has been stable the last year. The last week, she started having some cough, congestion, sinus issues, wheezing, fevers, chills. Her symptoms improved except for the cough, wheezing, dyspnea. Denies any sick contacts. She started using her Qvar, 2 puffs 4 times a day.     Review of Systems  Constitutional: Negative.  Negative for fever and unexpected weight change.  HENT: Positive for congestion. Negative for dental problem, ear pain, nosebleeds, postnasal drip, rhinorrhea, sinus pressure, sneezing, sore throat and trouble swallowing.   Eyes: Negative.  Negative for redness and itching.  Respiratory: Positive for cough and wheezing. Negative for chest tightness.   Cardiovascular: Negative.  Negative for chest pain, palpitations and leg swelling.  Gastrointestinal: Negative.  Negative for nausea and vomiting.  Endocrine: Negative.   Genitourinary: Negative.  Negative for dysuria.  Musculoskeletal: Negative.  Negative for joint swelling.  Skin: Negative.  Negative for rash.  Allergic/Immunologic: Positive for environmental allergies.  Neurological: Positive for headaches.  Hematological: Negative.  Does not bruise/bleed easily.  Psychiatric/Behavioral: Negative.  Negative for dysphoric mood. The patient is not nervous/anxious.   All other systems reviewed and are negative.  Past Medical History  Diagnosis Date  . Osteopenia   . Allergic rhinitis     seasonal  . Asthma     -FeV1 106% 2006  . Sinusitis   . Urinary tract infection, site not specified   . Hypertension     . Lower back pain   (-) CA, DVT   Family History  Problem Relation Age of Onset  . Stroke Mother   . Cancer Mother 19    stomach  . Heart disease Mother   . Parkinsonism Father   . Glaucoma Father      Past Surgical History  Procedure Laterality Date  . Bunionectomy    . Knee arthroscopy      left  . Rotator cuff repair      right    Social History   Social History  . Marital Status: Single    Spouse Name: N/A  . Number of Children: N/A  . Years of Education: N/A   Occupational History  . Not on file.   Social History Main Topics  . Smoking status: Never Smoker   . Smokeless tobacco: Never Used  . Alcohol Use: 1.2 oz/week    2 Glasses of wine per week  . Drug Use: No  . Sexual Activity: Yes    Birth Control/ Protection: Other-see comments   Other Topics Concern  . Not on file   Social History Narrative   Regular exercise-yes.   Nurse, professor of nursing. (-) smoke. Occasional drinking.   Allergies  Allergen Reactions  . Albuterol Anaphylaxis and Shortness Of Breath  . Oxycodone Nausea Only  . Amlodipine     Weak   . Benicar [Olmesartan]     Diarrhea per pt  . Codeine   . Hctz [Hydrochlorothiazide]     dizziness  .  Hydromorphone Hcl   . Losartan     HA, weak     Outpatient Prescriptions Prior to Visit  Medication Sig Dispense Refill  . aspirin EC 81 MG tablet Take by mouth.    . BENICAR 20 MG tablet TAKE 1/2 TABLET BY MOUTH EVERY DAY 15 tablet 3  . calcium carbonate (OS-CAL) 600 MG TABS Take 600 mg by mouth daily.      . cetirizine (ZYRTEC) 10 MG tablet Take 10 mg by mouth as needed.     Marland Kitchen EPINEPHrine (EPIPEN 2-PAK) 0.3 mg/0.3 mL IJ SOAJ injection As directed 1 Device 3  . estradiol (ESTRACE) 0.1 MG/GM vaginal cream Place 2 g vaginally 2 (two) times a week.      . meloxicam (MOBIC) 7.5 MG tablet Take 1 tablet (7.5 mg total) by mouth daily as needed. 30 tablet 3  . Pediatric Multiple Vit-C-FA (MULTIVITAMIN ANIMAL SHAPES, WITH CA/FA,) WITH C  & FA CHEW Chew 1 tablet by mouth daily.      . promethazine (PHENERGAN) 25 MG tablet Take 1 tablet (25 mg total) by mouth every 4 (four) hours as needed for nausea or vomiting. 30 tablet 0  . pseudoephedrine (SUDAFED) 30 MG tablet Take 2 tablets (60 mg total) by mouth every 4 (four) hours as needed for congestion. 60 tablet 2  . QVAR 80 MCG/ACT inhaler USE 2 PUFS BY MOUTH EVERY DAY 8.7 g 5  . solifenacin (VESICARE) 5 MG tablet Take 1 tablet by mouth daily.  Ok to increase to 2 if no side effects    . traMADol (ULTRAM) 50 MG tablet Take by mouth as needed.    . Multiple Vitamins-Minerals (MULTIVITAMIN PO) Take by mouth.    . fish oil-omega-3 fatty acids 1000 MG capsule Take 2 g by mouth daily.    . Albuterol Sulfate (PROAIR RESPICLICK) 123XX123 (90 BASE) MCG/ACT AEPB Inhale 1-2 puffs into the lungs 4 (four) times daily as needed. 1 each 11  . aspirin 81 MG tablet Take 81 mg by mouth daily.      Marland Kitchen azithromycin (ZITHROMAX) 250 MG tablet As directed 6 tablet 0  . irbesartan (AVAPRO) 150 MG tablet Take 1 tablet (150 mg total) by mouth daily. (Patient not taking: Reported on 04/24/2015) 30 tablet 11  . predniSONE (DELTASONE) 10 MG tablet Prednisone 10 mg: take 4 tabs a day x 3 days; then 3 tabs a day x 4 days; then 2 tabs a day x 4 days, then 1 tab a day x 6 days, then stop. Take pc. 38 tablet 1   No facility-administered medications prior to visit.   Meds ordered this encounter  Medications  . Omega-3 Fatty Acids (FISH OIL) 1000 MG CPDR    Sig: Take by mouth.          Objective:   Physical Exam  Vitals:  Filed Vitals:   04/24/15 1013  BP: 152/74  Pulse: 78  Height: 5\' 1"  (1.549 m)  Weight: 128 lb (58.06 kg)  SpO2: 98%    Constitutional/General:  Pleasant, well-nourished, well-developed, not in any distress,  Comfortably seating.  Well kempt, coughing periodically.   Body mass index is 24.2 kg/(m^2). Wt Readings from Last 3 Encounters:  04/24/15 128 lb (58.06 kg)  05/01/14 130 lb  (58.968 kg)  11/09/13 127 lb (57.607 kg)     HEENT: Pupils equal and reactive to light and accommodation. Anicteric sclerae. Normal nasal mucosa.   No oral  lesions,  mouth clear,  oropharynx clear, no postnasal  drip. (-) Oral thrush. No dental caries.  Airway - Mallampati class III  Neck: No masses. Midline trachea. No JVD, (-) LAD. (-) bruits appreciated.  Respiratory/Chest: Grossly normal chest. (-) deformity. (-) Accessory muscle use.  Symmetric expansion. (-) Tenderness on palpation.  Resonant on percussion.  Diminished BS on both lower lung zones. (+) wheezing bibasilar, (-) crackles, rhonchi (-) egophony  Cardiovascular: Regular rate and  rhythm, heart sounds normal, no murmur or gallops, no peripheral edema  Gastrointestinal:  Normal bowel sounds. Soft, non-tender. No hepatosplenomegaly.  (-) masses.   Musculoskeletal:  Normal muscle tone. Normal gait.   Extremities: Grossly normal. (-) clubbing, cyanosis.  (-) edema  Skin: (-) rash,lesions seen.   Neurological/Psychiatric : alert, oriented to time, place, person. Normal mood and affect            Assessment & Plan:  Asthmatic bronchitis Patient with recent asthma flare after a viral infection. Cough and wheezing and dyspnea are not better. Plan : 1. Change her Qvar to 80 g per puff, 4 puffs, twice a day. Once her asthma is controlled, resume her usual dose of 2 puffs BID. 2. Pred taper -- 30 mg/d x 3 days, taper off by 10 mg q 3-4 days. May need more as she has been sick a while. 3. Start Anoro 1 puff daily. Gave her a sample. Pt will call if she will need anoro after 2 weeks.  4. Maxair 2 p q 4 prn. 5. Zpak. 6. CXR. Wanted to do cbc, bmet but she wanted to hold off. 7. Pt to call if not better.   Asthma Currently in exacerbation. As discussed above in asthmatic bronchitis. Once controlled, resume qvar 80 mcg, 2P BID.  Return to clinic in as needed. 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  Monica Becton, MD 04/24/2015, 12:35 PM [facility] Pager [phone] After 3 pm or if no answer, call [phone]

## 2015-04-24 NOTE — Patient Instructions (Signed)
1. Use Qvar 80 g per puff, 4 puffs twice a day. Rinse  mouth each time using it. Once your asthma is stable, cut down to 2 puffs twice a day. 2. Start Anoro 1puff daily for 2 weeks. If you need more, let us know. We will give you a sample. 3. Start Maxair, 2puffs every 4 hrs as needed. 4. Prednisone taper as we discussed. 5. Zpak as we discussed. 6. We will do Chest Xray today.   Return to clinic in as needed.

## 2015-04-24 NOTE — Telephone Encounter (Signed)
Called and spoke with the pharmacy. I informed her of AD's new recs and called in Atrovent HFA. She voiced understanding and had no further questions. Rx was phone in. Nothing further needed.

## 2015-04-24 NOTE — Assessment & Plan Note (Signed)
Currently in exacerbation. As discussed above in asthmatic bronchitis. Once controlled, resume qvar 80 mcg, 2P BID.

## 2015-04-24 NOTE — Assessment & Plan Note (Signed)
Patient with recent asthma flare after a viral infection. Cough and wheezing and dyspnea are not better. Plan : 1. Change her Qvar to 80 g per puff, 4 puffs, twice a day. Once her asthma is controlled, resume her usual dose of 2 puffs BID. 2. Pred taper -- 30 mg/d x 3 days, taper off by 10 mg q 3-4 days. May need more as she has been sick a while. 3. Start Anoro 1 puff daily. Gave her a sample. Pt will call if she will need anoro after 2 weeks.  4. Maxair 2 p q 4 prn. 5. Zpak. 6. CXR. Wanted to do cbc, bmet but she wanted to hold off. 7. Pt to call if not better.

## 2015-05-02 ENCOUNTER — Encounter: Payer: 59 | Admitting: Internal Medicine

## 2015-05-08 ENCOUNTER — Ambulatory Visit (INDEPENDENT_AMBULATORY_CARE_PROVIDER_SITE_OTHER): Payer: 59 | Admitting: Internal Medicine

## 2015-05-08 ENCOUNTER — Encounter: Payer: Self-pay | Admitting: Internal Medicine

## 2015-05-08 VITALS — BP 150/70 | HR 75 | Ht 61.0 in | Wt 132.0 lb

## 2015-05-08 DIAGNOSIS — Z7184 Encounter for health counseling related to travel: Secondary | ICD-10-CM

## 2015-05-08 DIAGNOSIS — Z0000 Encounter for general adult medical examination without abnormal findings: Secondary | ICD-10-CM | POA: Diagnosis not present

## 2015-05-08 DIAGNOSIS — E785 Hyperlipidemia, unspecified: Secondary | ICD-10-CM

## 2015-05-08 DIAGNOSIS — R739 Hyperglycemia, unspecified: Secondary | ICD-10-CM | POA: Diagnosis not present

## 2015-05-08 DIAGNOSIS — I10 Essential (primary) hypertension: Secondary | ICD-10-CM | POA: Diagnosis not present

## 2015-05-08 DIAGNOSIS — Z7189 Other specified counseling: Secondary | ICD-10-CM | POA: Diagnosis not present

## 2015-05-08 MED ORDER — OLMESARTAN MEDOXOMIL 20 MG PO TABS: 20.0000 mg | ORAL_TABLET | Freq: Every day | ORAL | Status: DC

## 2015-05-08 MED ORDER — ICOSAPENT ETHYL 1 G PO CAPS: 2.0000 | ORAL_CAPSULE | Freq: Two times a day (BID) | ORAL | Status: DC

## 2015-05-08 MED ORDER — CIPROFLOXACIN HCL 500 MG PO TABS: 500.0000 mg | ORAL_TABLET | Freq: Two times a day (BID) | ORAL | Status: DC

## 2015-05-08 NOTE — Progress Notes (Signed)
Subjective:  Patient ID: [name], female    DOB: 1945/07/27  Age: 70 y.o. MRN: [id number]  CC: Annual Exam   HPI [name] presents for a well exam. BP has been up after activities  Outpatient Prescriptions Prior to Visit  Medication Sig Dispense Refill  . aspirin EC 81 MG tablet Take by mouth.    . beclomethasone (QVAR) 80 MCG/ACT inhaler Inhale 2 puffs into the lungs 2 (two) times daily. 8.7 g 5  . BENICAR 20 MG tablet TAKE 1/2 TABLET BY MOUTH EVERY DAY 15 tablet 3  . calcium carbonate (OS-CAL) 600 MG TABS Take 600 mg by mouth daily.      . cetirizine (ZYRTEC) 10 MG tablet Take 10 mg by mouth as needed.     Marland Kitchen EPINEPHrine (EPIPEN 2-PAK) 0.3 mg/0.3 mL IJ SOAJ injection As directed 1 Device 3  . estradiol (ESTRACE) 0.1 MG/GM vaginal cream Place 2 g vaginally 2 (two) times a week.      Marland Kitchen ipratropium (ATROVENT HFA) 17 MCG/ACT inhaler Inhale 2 puffs into the lungs every 4 (four) hours as needed for wheezing. 1 Inhaler 5  . meloxicam (MOBIC) 7.5 MG tablet Take 1 tablet (7.5 mg total) by mouth daily as needed. 30 tablet 3  . Omega-3 Fatty Acids (FISH OIL) 1000 MG CPDR Take by mouth.    . Pediatric Multiple Vit-C-FA (MULTIVITAMIN ANIMAL SHAPES, WITH CA/FA,) WITH C & FA CHEW Chew 1 tablet by mouth daily.      . pirbuterol (MAXAIR AUTOHALER) 200 MCG/INH inhaler Inhale 2 puffs into the lungs every 4 (four) hours as needed for wheezing. 1 Inhaler 12  . predniSONE (DELTASONE) 10 MG tablet TAKE 30MG  DAILY FOR 30 DAYS 90 tablet 0  . promethazine (PHENERGAN) 25 MG tablet Take 1 tablet (25 mg total) by mouth every 4 (four) hours as needed for nausea or vomiting. 30 tablet 0  . pseudoephedrine (SUDAFED) 30 MG tablet Take 2 tablets (60 mg total) by mouth every 4 (four) hours as needed for congestion. 60 tablet 2  . solifenacin (VESICARE) 5 MG tablet Take 1 tablet by mouth daily.  Ok to increase to 2 if no side effects    . traMADol (ULTRAM) 50 MG tablet Take by mouth as needed.    .  umeclidinium-vilanterol (ANORO ELLIPTA) 62.5-25 MCG/INH AEPB Inhale 1 puff into the lungs daily. 1 each 0  . azithromycin (ZITHROMAX) 250 MG tablet TAKE AS DIRECTED 6 tablet 0  . fish oil-omega-3 fatty acids 1000 MG capsule Take 2 g by mouth daily.     No facility-administered medications prior to visit.    ROS Review of Systems  Constitutional: Negative for chills, activity change, appetite change, fatigue and unexpected weight change.  HENT: Negative for congestion, mouth sores and sinus pressure.   Eyes: Negative for visual disturbance.  Respiratory: Negative for cough and chest tightness.   Gastrointestinal: Negative for nausea and abdominal pain.  Genitourinary: Negative for frequency, difficulty urinating and vaginal pain.  Musculoskeletal: Negative for back pain and gait problem.  Skin: Negative for pallor and rash.  Neurological: Negative for dizziness, tremors, weakness, numbness and headaches.  Psychiatric/Behavioral: Negative for confusion and sleep disturbance. The patient is not nervous/anxious.     Objective:  BP 150/70 mmHg  Pulse 75  Ht 5\' 1"  (1.549 m)  Wt 132 lb (59.875 kg)  BMI 24.95 kg/m2  SpO2 97%  BP Readings from Last 3 Encounters:  05/08/15 150/70  04/24/15 152/74  05/01/14 170/86  Wt Readings from Last 3 Encounters:  05/08/15 132 lb (59.875 kg)  04/24/15 128 lb (58.06 kg)  05/01/14 130 lb (58.968 kg)    Physical Exam  Constitutional: She appears well-developed. No distress.  HENT:  Head: Normocephalic.  Right Ear: External ear normal.  Left Ear: External ear normal.  Nose: Nose normal.  Mouth/Throat: Oropharynx is clear and moist.  Eyes: Conjunctivae are normal. Pupils are equal, round, and reactive to light. Right eye exhibits no discharge. Left eye exhibits no discharge.  Neck: Normal range of motion. Neck supple. No JVD present. No tracheal deviation present. No thyromegaly present.  Cardiovascular: Normal rate, regular rhythm and normal  heart sounds.   Pulmonary/Chest: No stridor. No respiratory distress. She has no wheezes.  Abdominal: Soft. Bowel sounds are normal. She exhibits no distension and no mass. There is no tenderness. There is no rebound and no guarding.  Musculoskeletal: She exhibits no edema or tenderness.  Lymphadenopathy:    She has no cervical adenopathy.  Neurological: She displays normal reflexes. No cranial nerve deficit. She exhibits normal muscle tone. Coordination normal.  Skin: No rash noted. No erythema.  Psychiatric: She has a normal mood and affect. Her behavior is normal. Judgment and thought content normal.    Lab Results  Component Value Date   WBC 4.1 05/03/2014   HGB 12.4 05/03/2014   HCT 36.5 05/03/2014   PLT 241.0 05/03/2014   GLUCOSE 90 05/03/2014   CHOL 241* 05/03/2014   TRIG 97.0 05/03/2014   HDL 78.30 05/03/2014   LDLDIRECT 126.2 10/06/2012   LDLCALC 143* 05/03/2014   ALT 15 05/03/2014   AST 19 05/03/2014   NA 140 05/03/2014   K 4.0 05/03/2014   CL 106 05/03/2014   CREATININE 1.05 05/03/2014   BUN 18 05/03/2014   CO2 28 05/03/2014   TSH 3.75 05/03/2014   INR 0.95 07/20/2010   HGBA1C 6.0 10/06/2012    Dg Chest 2 View  04/24/2015  CLINICAL DATA:  Cough and wheezing.  Short of breath. EXAM: CHEST  2 VIEW COMPARISON:  None. FINDINGS: The heart size and mediastinal contours are within normal limits. Both lungs are clear. The visualized skeletal structures are unremarkable. IMPRESSION: No active cardiopulmonary disease. Electronically Signed   By: Franchot Gallo M.D.   On: 04/24/2015 14:18    Assessment & Plan:   There are no diagnoses linked to this encounter. I have discontinued Ms. Spraker's azithromycin. I am also having her maintain her estradiol, cetirizine, multivitamin animal shapes (with Ca/FA), calcium carbonate, fish oil-omega-3 fatty acids, meloxicam, promethazine, pseudoephedrine, solifenacin, aspirin EC, traMADol, EPINEPHrine, BENICAR, Fish Oil,  umeclidinium-vilanterol, predniSONE, pirbuterol, beclomethasone, and ipratropium.  No orders of the defined types were placed in this encounter.     Follow-up: No Follow-up on file.  Walker Kehr, MD

## 2015-05-08 NOTE — Assessment & Plan Note (Signed)
4/17 will try Vascepa Rx

## 2015-05-08 NOTE — Assessment & Plan Note (Signed)
A1c

## 2015-05-08 NOTE — Progress Notes (Signed)
Pre visit review using our clinic review tool, if applicable. No additional management support is needed unless otherwise documented below in the visit note. 

## 2015-05-08 NOTE — Assessment & Plan Note (Addendum)
Not well controlled Benicar - increase to 20 mg/d

## 2015-05-08 NOTE — Assessment & Plan Note (Signed)
Cipro Rx

## 2015-05-08 NOTE — Assessment & Plan Note (Signed)
We discussed age appropriate health related issues, including available/recomended screening tests and vaccinations. We discussed a need for adhering to healthy diet and exercise. Labs/EKG were reviewed/ordered. All questions were answered.   

## 2015-05-14 ENCOUNTER — Other Ambulatory Visit: Payer: Self-pay | Admitting: Internal Medicine

## 2015-05-25 ENCOUNTER — Institutional Professional Consult (permissible substitution): Payer: 59 | Admitting: Pulmonary Disease

## 2015-05-31 ENCOUNTER — Other Ambulatory Visit (INDEPENDENT_AMBULATORY_CARE_PROVIDER_SITE_OTHER): Payer: 59

## 2015-05-31 DIAGNOSIS — Z0000 Encounter for general adult medical examination without abnormal findings: Secondary | ICD-10-CM | POA: Diagnosis not present

## 2015-05-31 DIAGNOSIS — R739 Hyperglycemia, unspecified: Secondary | ICD-10-CM

## 2015-05-31 LAB — CBC WITH DIFFERENTIAL/PLATELET
Basophils Absolute: 0.1 10*3/uL (ref 0.0–0.1)
Basophils Relative: 1.7 % (ref 0.0–3.0)
Eosinophils Absolute: 0.1 10*3/uL (ref 0.0–0.7)
Eosinophils Relative: 2.6 % (ref 0.0–5.0)
HCT: 35.4 % — ABNORMAL LOW (ref 36.0–46.0)
Hemoglobin: 12.3 g/dL (ref 12.0–15.0)
Lymphocytes Relative: 44.2 % (ref 12.0–46.0)
Lymphs Abs: 2.2 10*3/uL (ref 0.7–4.0)
MCHC: 34.7 g/dL (ref 30.0–36.0)
MCV: 90.8 fl (ref 78.0–100.0)
Monocytes Absolute: 0.6 10*3/uL (ref 0.1–1.0)
Monocytes Relative: 11.3 % (ref 3.0–12.0)
Neutro Abs: 2 10*3/uL (ref 1.4–7.7)
Neutrophils Relative %: 40.2 % — ABNORMAL LOW (ref 43.0–77.0)
Platelets: 271 10*3/uL (ref 150.0–400.0)
RBC: 3.9 Mil/uL (ref 3.87–5.11)
RDW: 13.7 % (ref 11.5–15.5)
WBC: 5 10*3/uL (ref 4.0–10.5)

## 2015-05-31 LAB — LIPID PANEL
Cholesterol: 259 mg/dL — ABNORMAL HIGH (ref 0–200)
HDL: 69.6 mg/dL (ref >39.00)
LDL Cholesterol: 168 mg/dL — ABNORMAL HIGH (ref 0–99)
NonHDL: 189.15
Total CHOL/HDL Ratio: 4
Triglycerides: 106 mg/dL (ref 0.0–149.0)
VLDL: 21.2 mg/dL (ref 0.0–40.0)

## 2015-05-31 LAB — URINALYSIS
Bilirubin Urine: NEGATIVE
Hgb urine dipstick: NEGATIVE
Ketones, ur: NEGATIVE
Leukocytes, UA: NEGATIVE
Nitrite: NEGATIVE
Specific Gravity, Urine: 1.01 (ref 1.000–1.030)
Total Protein, Urine: NEGATIVE
Urine Glucose: NEGATIVE
Urobilinogen, UA: 0.2 (ref 0.0–1.0)
pH: 6.5 (ref 5.0–8.0)

## 2015-05-31 LAB — BASIC METABOLIC PANEL
BUN: 26 mg/dL — ABNORMAL HIGH (ref 6–23)
CO2: 27 mEq/L (ref 19–32)
Calcium: 9.6 mg/dL (ref 8.4–10.5)
Chloride: 106 mEq/L (ref 96–112)
Creatinine, Ser: 0.96 mg/dL (ref 0.40–1.20)
GFR: 61.44 mL/min (ref >60.00)
Glucose, Bld: 101 mg/dL — ABNORMAL HIGH (ref 70–99)
Potassium: 4.4 mEq/L (ref 3.5–5.1)
Sodium: 139 mEq/L (ref 135–145)

## 2015-05-31 LAB — HEMOGLOBIN A1C: Hgb A1c MFr Bld: 5.8 % (ref 4.6–6.5)

## 2015-05-31 LAB — HEPATIC FUNCTION PANEL
ALT: 14 U/L (ref 0–35)
AST: 16 U/L (ref 0–37)
Albumin: 4.3 g/dL (ref 3.5–5.2)
Alkaline Phosphatase: 42 U/L (ref 39–117)
Bilirubin, Direct: 0 mg/dL (ref 0.0–0.3)
Total Bilirubin: 0.4 mg/dL (ref 0.2–1.2)
Total Protein: 6.9 g/dL (ref 6.0–8.3)

## 2015-05-31 LAB — TSH: TSH: 3.34 u[IU]/mL (ref 0.35–4.50)

## 2015-06-01 LAB — HEPATITIS C ANTIBODY: HCV Ab: NEGATIVE

## 2015-08-01 ENCOUNTER — Other Ambulatory Visit: Payer: Self-pay | Admitting: Internal Medicine

## 2015-08-01 NOTE — Telephone Encounter (Signed)
Left msg on triage stating she got back from [place] today, but while sge was there she became sick w/Bronchitis. She started on some prednisone, but only had 2 1/2 day coarse of cipro. Wanting to see if md would call in refill on cipro so she can have full coarse...Johny Chess

## 2015-09-06 DIAGNOSIS — H401131 Primary open-angle glaucoma, bilateral, mild stage: Secondary | ICD-10-CM | POA: Insufficient documentation

## 2015-09-14 HISTORY — PX: CATARACT EXTRACTION, BILATERAL: SHX1313

## 2015-10-08 DIAGNOSIS — H524 Presbyopia: Secondary | ICD-10-CM | POA: Insufficient documentation

## 2015-10-18 ENCOUNTER — Ambulatory Visit: Payer: 59

## 2015-10-23 ENCOUNTER — Ambulatory Visit (INDEPENDENT_AMBULATORY_CARE_PROVIDER_SITE_OTHER): Payer: 59

## 2015-10-23 DIAGNOSIS — Z23 Encounter for immunization: Secondary | ICD-10-CM

## 2015-11-07 ENCOUNTER — Ambulatory Visit: Payer: 59 | Admitting: Internal Medicine

## 2015-11-26 ENCOUNTER — Ambulatory Visit: Payer: 59 | Admitting: Internal Medicine

## 2015-12-11 ENCOUNTER — Ambulatory Visit (INDEPENDENT_AMBULATORY_CARE_PROVIDER_SITE_OTHER): Payer: 59 | Admitting: Internal Medicine

## 2015-12-11 ENCOUNTER — Encounter: Payer: Self-pay | Admitting: Internal Medicine

## 2015-12-11 VITALS — BP 128/70 | HR 73 | Temp 97.9°F | Resp 16 | Ht 61.0 in | Wt 136.8 lb

## 2015-12-11 DIAGNOSIS — I10 Essential (primary) hypertension: Secondary | ICD-10-CM | POA: Diagnosis not present

## 2015-12-11 DIAGNOSIS — J4521 Mild intermittent asthma with (acute) exacerbation: Secondary | ICD-10-CM | POA: Diagnosis not present

## 2015-12-11 DIAGNOSIS — J309 Allergic rhinitis, unspecified: Secondary | ICD-10-CM | POA: Diagnosis not present

## 2015-12-11 DIAGNOSIS — E785 Hyperlipidemia, unspecified: Secondary | ICD-10-CM

## 2015-12-11 DIAGNOSIS — Z0000 Encounter for general adult medical examination without abnormal findings: Secondary | ICD-10-CM

## 2015-12-11 MED ORDER — ICOSAPENT ETHYL 1 G PO CAPS: 2.0000 | ORAL_CAPSULE | Freq: Two times a day (BID) | ORAL | 11 refills | Status: DC

## 2015-12-11 MED ORDER — AZITHROMYCIN 250 MG PO TABS: ORAL_TABLET | ORAL | 0 refills | Status: DC

## 2015-12-11 MED ORDER — OLMESARTAN MEDOXOMIL 40 MG PO TABS: 40.0000 mg | ORAL_TABLET | Freq: Every day | ORAL | 11 refills | Status: DC

## 2015-12-11 NOTE — Progress Notes (Signed)
Subjective:  Patient ID: [name], female    DOB: Oct 19, 1945  Age: 70 y.o. MRN: [id number]  CC: Follow-up (6-Mths: HTN; Dyslipidemia; Hyperglycemia)   HPI KARIMAH FOXWORTH presents for HTN, asthma, OAB f/u SBP 120-140  Outpatient Medications Prior to Visit  Medication Sig Dispense Refill  . aspirin EC 81 MG tablet Take by mouth.    . beclomethasone (QVAR) 80 MCG/ACT inhaler Inhale 2 puffs into the lungs 2 (two) times daily. 8.7 g 5  . cetirizine (ZYRTEC) 10 MG tablet Take 10 mg by mouth as needed.     Marland Kitchen EPINEPHrine (EPIPEN 2-PAK) 0.3 mg/0.3 mL IJ SOAJ injection As directed 1 Device 3  . estradiol (ESTRACE) 0.1 MG/GM vaginal cream Place 2 g vaginally 3 (three) times a week.     Marland Kitchen ipratropium (ATROVENT HFA) 17 MCG/ACT inhaler Inhale 2 puffs into the lungs every 4 (four) hours as needed for wheezing. 1 Inhaler 5  . olmesartan (BENICAR) 20 MG tablet Take 1 tablet (20 mg total) by mouth daily. 30 tablet 11  . Pediatric Multiple Vit-C-FA (MULTIVITAMIN ANIMAL SHAPES, WITH CA/FA,) WITH C & FA CHEW Chew 1 tablet by mouth daily.      . promethazine (PHENERGAN) 25 MG tablet Take 1 tablet (25 mg total) by mouth every 4 (four) hours as needed for nausea or vomiting. 30 tablet 0  . pseudoephedrine (SUDAFED) 30 MG tablet Take 2 tablets (60 mg total) by mouth every 4 (four) hours as needed for congestion. 60 tablet 2  . solifenacin (VESICARE) 5 MG tablet daily as needed. Take 1 tablet by mouth daily.  Ok to increase to 2 if no side effects    . traMADol (ULTRAM) 50 MG tablet Take by mouth as needed.    . calcium carbonate (OS-CAL) 600 MG TABS Take 600 mg by mouth daily.      Marland Kitchen olmesartan (BENICAR) 20 MG tablet TAKE 1/2 TABLET BY MOUTH EVERY DAY 45 tablet 2  . pirbuterol (MAXAIR AUTOHALER) 200 MCG/INH inhaler Inhale 2 puffs into the lungs every 4 (four) hours as needed for wheezing. 1 Inhaler 12  . fish oil-omega-3 fatty acids 1000 MG capsule Take 2 g by mouth daily.    Vanessa Kick Ethyl (VASCEPA) 1 g  CAPS Take 2 capsules by mouth 2 (two) times daily. (Patient not taking: Reported on 12/11/2015) 120 capsule 11  . ciprofloxacin (CIPRO) 500 MG tablet Take 1 tablet (500 mg total) by mouth 2 (two) times daily. 20 tablet 0  . Omega-3 Fatty Acids (FISH OIL) 1000 MG CPDR Take by mouth.    . predniSONE (DELTASONE) 10 MG tablet TAKE 30MG  DAILY FOR 30 DAYS 90 tablet 0  . umeclidinium-vilanterol (ANORO ELLIPTA) 62.5-25 MCG/INH AEPB Inhale 1 puff into the lungs daily. 1 each 0   No facility-administered medications prior to visit.     ROS Review of Systems  Constitutional: Positive for unexpected weight change. Negative for activity change, appetite change, chills, diaphoresis, fatigue and fever.  HENT: Negative for congestion, ear pain, facial swelling, hearing loss, mouth sores, nosebleeds, rhinorrhea, sinus pressure, sneezing, sore throat, tinnitus and trouble swallowing.   Eyes: Negative for pain, discharge, redness, itching and visual disturbance.  Respiratory: Negative for cough, chest tightness, shortness of breath, wheezing and stridor.   Cardiovascular: Negative for chest pain, palpitations and leg swelling.  Gastrointestinal: Negative for abdominal distention, anal bleeding, blood in stool, constipation, diarrhea, nausea and rectal pain.  Genitourinary: Positive for urgency. Negative for difficulty urinating, dysuria, hematuria, pelvic  pain, vaginal bleeding and vaginal discharge.  Musculoskeletal: Negative for arthralgias, back pain, gait problem, joint swelling, neck pain and neck stiffness.  Skin: Negative.  Negative for rash.  Neurological: Negative for dizziness, tremors, seizures, syncope, speech difficulty, weakness, numbness and headaches.  Hematological: Negative for adenopathy. Does not bruise/bleed easily.  Psychiatric/Behavioral: Negative for behavioral problems, decreased concentration, dysphoric mood, sleep disturbance and suicidal ideas. The patient is not nervous/anxious.      Objective:  Ht 5\' 1"  (1.549 m)   Wt 136 lb 12 oz (62 kg)   BMI 25.84 kg/m   BP Readings from Last 3 Encounters:  05/08/15 (!) 150/70  04/24/15 (!) 152/74  05/01/14 (!) 170/86    Wt Readings from Last 3 Encounters:  12/11/15 136 lb 12 oz (62 kg)  05/08/15 132 lb (59.9 kg)  04/24/15 128 lb (58.1 kg)    Physical Exam  Constitutional: She appears well-developed. No distress.  HENT:  Head: Normocephalic.  Right Ear: External ear normal.  Left Ear: External ear normal.  Nose: Nose normal.  Mouth/Throat: Oropharynx is clear and moist.  Eyes: Conjunctivae are normal. Pupils are equal, round, and reactive to light. Right eye exhibits no discharge. Left eye exhibits no discharge.  Neck: Normal range of motion. Neck supple. No JVD present. No tracheal deviation present. No thyromegaly present.  Cardiovascular: Normal rate, regular rhythm and normal heart sounds.   Pulmonary/Chest: No stridor. No respiratory distress. She has no wheezes.  Abdominal: Soft. Bowel sounds are normal. She exhibits no distension and no mass. There is no tenderness. There is no rebound and no guarding.  Musculoskeletal: She exhibits no edema or tenderness.  Lymphadenopathy:    She has no cervical adenopathy.  Neurological: She displays normal reflexes. No cranial nerve deficit. She exhibits normal muscle tone. Coordination normal.  Skin: No rash noted. No erythema.  Psychiatric: She has a normal mood and affect. Her behavior is normal. Judgment and thought content normal.    Lab Results  Component Value Date   WBC 5.0 05/31/2015   HGB 12.3 05/31/2015   HCT 35.4 (L) 05/31/2015   PLT 271.0 05/31/2015   GLUCOSE 101 (H) 05/31/2015   CHOL 259 (H) 05/31/2015   TRIG 106.0 05/31/2015   HDL 69.60 05/31/2015   LDLDIRECT 126.2 10/06/2012   LDLCALC 168 (H) 05/31/2015   ALT 14 05/31/2015   AST 16 05/31/2015   NA 139 05/31/2015   K 4.4 05/31/2015   CL 106 05/31/2015   CREATININE 0.96 05/31/2015   BUN 26  (H) 05/31/2015   CO2 27 05/31/2015   TSH 3.34 05/31/2015   INR 0.95 07/20/2010   HGBA1C 5.8 05/31/2015    Dg Chest 2 View  Result Date: 04/24/2015 CLINICAL DATA:  Cough and wheezing.  Short of breath. EXAM: CHEST  2 VIEW COMPARISON:  None. FINDINGS: The heart size and mediastinal contours are within normal limits. Both lungs are clear. The visualized skeletal structures are unremarkable. IMPRESSION: No active cardiopulmonary disease. Electronically Signed   By: Franchot Gallo M.D.   On: 04/24/2015 14:18    Assessment & Plan:   There are no diagnoses linked to this encounter. I have discontinued Ms. Madlock's calcium carbonate, Fish Oil, umeclidinium-vilanterol, predniSONE, pirbuterol, and ciprofloxacin. I am also having her maintain her estradiol, cetirizine, multivitamin animal shapes (with Ca/FA), fish oil-omega-3 fatty acids, promethazine, pseudoephedrine, solifenacin, aspirin EC, traMADol, EPINEPHrine, beclomethasone, ipratropium, Icosapent Ethyl, olmesartan, and Calcium Citrate (CITRACAL PO).  Meds ordered this encounter  Medications  . Calcium Citrate (  CITRACAL PO)    Sig: Take by mouth daily.     Follow-up: No Follow-up on file.  Walker Kehr, MD

## 2015-12-11 NOTE — Progress Notes (Signed)
Pre visit review using our clinic review tool, if applicable. No additional management support is needed unless otherwise documented below in the visit note/SLS  

## 2015-12-11 NOTE — Assessment & Plan Note (Signed)
Zyrtec  

## 2015-12-11 NOTE — Assessment & Plan Note (Signed)
QVAR  Atrovent, Maxair prn

## 2015-12-11 NOTE — Assessment & Plan Note (Signed)
Vascepa Labs

## 2015-12-11 NOTE — Assessment & Plan Note (Signed)
We can try  Benicar 40 mg a day if tol

## 2015-12-13 ENCOUNTER — Telehealth: Payer: Self-pay

## 2015-12-13 NOTE — Telephone Encounter (Signed)
PA initiated via CoverMyMeds key [id number]

## 2015-12-14 DIAGNOSIS — H2512 Age-related nuclear cataract, left eye: Secondary | ICD-10-CM | POA: Insufficient documentation

## 2015-12-14 DIAGNOSIS — H25012 Cortical age-related cataract, left eye: Secondary | ICD-10-CM | POA: Insufficient documentation

## 2015-12-17 MED ORDER — MEGARED OMEGA-3 KRILL OIL 500 MG PO CAPS: 1.0000 | ORAL_CAPSULE | Freq: Every morning | ORAL | 3 refills | Status: DC

## 2015-12-17 NOTE — Telephone Encounter (Signed)
Noted Use Krill oil 1 a day Thx

## 2015-12-17 NOTE — Telephone Encounter (Signed)
Vacepa PA DENIED - pt's triglyceride levels do not meet medical criteria

## 2015-12-18 DIAGNOSIS — Z961 Presence of intraocular lens: Secondary | ICD-10-CM | POA: Insufficient documentation

## 2015-12-18 NOTE — Telephone Encounter (Signed)
Pt advised via personal VM 

## 2015-12-25 ENCOUNTER — Other Ambulatory Visit: Payer: Self-pay | Admitting: General Practice

## 2016-02-02 ENCOUNTER — Other Ambulatory Visit: Payer: Self-pay | Admitting: Internal Medicine

## 2016-02-28 ENCOUNTER — Telehealth: Payer: Self-pay | Admitting: Internal Medicine

## 2016-02-28 DIAGNOSIS — S82831A Other fracture of upper and lower end of right fibula, initial encounter for closed fracture: Secondary | ICD-10-CM | POA: Insufficient documentation

## 2016-02-28 NOTE — Telephone Encounter (Signed)
Patient Name: [name] DOB: 1945/09/03 Initial Comment Caller states she hit her head on cement. Her head hurts and she has been very tired afterwards. She also hurt her ankle. Nurse Assessment Nurse: Sherrell Puller, RN, Amy Date/Time Eilene Ghazi Time): 02/28/2016 10:19:09 AM Confirm and document reason for call. If symptomatic, describe symptoms. ---Caller states she was in [place] 2 days ago and she hit her right ankle on a block and fell, she hit the back of her head when she fell. She says she felt really tired afterwards but is wondering if that's not from the 3 hours time difference once she came home. Has a small lump back of head, no vomiting, no feeling of confusion or disorientation, no cuts or lacerations to the head, says she has a headache that comes and goes but does not have one now and that the Advil helps her headache. Right ankle swollen and pain level 9/10 when trying to bend her foot. She is able to bear weight and walk but with pain. She says she feel like the ankle has gotten a little better. Plans on going [facility] today to have it looked at. Does the patient have any new or worsening symptoms? ---Yes Will a triage be completed? ---Yes Related visit to physician within the last 2 weeks? ---No Does the PT have any chronic conditions? (i.e. diabetes, asthma, etc.) ---Yes List chronic conditions. ---Asthma, HTN Is this a behavioral health or substance abuse call? ---No Guidelines Guideline Title Affirmed Question Affirmed Notes Foot and Ankle Injury [1] Limp when walking AND [2] due to a twisted ankle or foot Final Disposition User See Physician within 24 Hours Ramsey, RN, Amy Referrals GO TO FACILITY OTHER - SPECIFY Disagree/Comply: Comply

## 2016-03-02 NOTE — Telephone Encounter (Signed)
Noted Agree w/plan Thx

## 2016-04-08 ENCOUNTER — Other Ambulatory Visit (INDEPENDENT_AMBULATORY_CARE_PROVIDER_SITE_OTHER): Payer: 59

## 2016-04-08 DIAGNOSIS — Z0000 Encounter for general adult medical examination without abnormal findings: Secondary | ICD-10-CM

## 2016-04-08 LAB — URINALYSIS
Bilirubin Urine: NEGATIVE
Hgb urine dipstick: NEGATIVE
Ketones, ur: NEGATIVE
Leukocytes, UA: NEGATIVE
Nitrite: NEGATIVE
Specific Gravity, Urine: 1.01 (ref 1.000–1.030)
Total Protein, Urine: NEGATIVE
Urine Glucose: NEGATIVE
Urobilinogen, UA: 0.2 (ref 0.0–1.0)
pH: 6.5 (ref 5.0–8.0)

## 2016-04-08 LAB — LIPID PANEL
Cholesterol: 236 mg/dL — ABNORMAL HIGH (ref 0–200)
HDL: 70.6 mg/dL (ref >39.00)
LDL Cholesterol: 140 mg/dL — ABNORMAL HIGH (ref 0–99)
NonHDL: 165.5
Total CHOL/HDL Ratio: 3
Triglycerides: 130 mg/dL (ref 0.0–149.0)
VLDL: 26 mg/dL (ref 0.0–40.0)

## 2016-04-08 LAB — BASIC METABOLIC PANEL
BUN: 13 mg/dL (ref 6–23)
CO2: 26 mEq/L (ref 19–32)
Calcium: 9.2 mg/dL (ref 8.4–10.5)
Chloride: 101 mEq/L (ref 96–112)
Creatinine, Ser: 1.05 mg/dL (ref 0.40–1.20)
GFR: 55.27 mL/min — ABNORMAL LOW (ref >60.00)
Glucose, Bld: 105 mg/dL — ABNORMAL HIGH (ref 70–99)
Potassium: 4.5 mEq/L (ref 3.5–5.1)
Sodium: 135 mEq/L (ref 135–145)

## 2016-04-08 LAB — HEPATIC FUNCTION PANEL
ALT: 14 U/L (ref 0–35)
AST: 15 U/L (ref 0–37)
Albumin: 4.1 g/dL (ref 3.5–5.2)
Alkaline Phosphatase: 40 U/L (ref 39–117)
Bilirubin, Direct: 0.1 mg/dL (ref 0.0–0.3)
Total Bilirubin: 0.5 mg/dL (ref 0.2–1.2)
Total Protein: 6.7 g/dL (ref 6.0–8.3)

## 2016-04-08 LAB — CBC WITH DIFFERENTIAL/PLATELET
Basophils Absolute: 0.1 10*3/uL (ref 0.0–0.1)
Basophils Relative: 1.4 % (ref 0.0–3.0)
Eosinophils Absolute: 0.2 10*3/uL (ref 0.0–0.7)
Eosinophils Relative: 5.1 % — ABNORMAL HIGH (ref 0.0–5.0)
HCT: 36.3 % (ref 36.0–46.0)
Hemoglobin: 12.3 g/dL (ref 12.0–15.0)
Lymphocytes Relative: 51.5 % — ABNORMAL HIGH (ref 12.0–46.0)
Lymphs Abs: 2.3 10*3/uL (ref 0.7–4.0)
MCHC: 33.8 g/dL (ref 30.0–36.0)
MCV: 92.3 fl (ref 78.0–100.0)
Monocytes Absolute: 0.5 10*3/uL (ref 0.1–1.0)
Monocytes Relative: 11.1 % (ref 3.0–12.0)
Neutro Abs: 1.4 10*3/uL (ref 1.4–7.7)
Neutrophils Relative %: 30.9 % — ABNORMAL LOW (ref 43.0–77.0)
Platelets: 258 10*3/uL (ref 150.0–400.0)
RBC: 3.93 Mil/uL (ref 3.87–5.11)
RDW: 12.9 % (ref 11.5–15.5)
WBC: 4.5 10*3/uL (ref 4.0–10.5)

## 2016-04-08 LAB — TSH: TSH: 4.8 u[IU]/mL — ABNORMAL HIGH (ref 0.35–4.50)

## 2016-04-09 ENCOUNTER — Encounter: Payer: Self-pay | Admitting: Internal Medicine

## 2016-04-09 ENCOUNTER — Ambulatory Visit (INDEPENDENT_AMBULATORY_CARE_PROVIDER_SITE_OTHER): Payer: 59 | Admitting: Internal Medicine

## 2016-04-09 VITALS — BP 128/56 | HR 75 | Temp 98.5°F | Resp 16 | Ht 61.0 in | Wt 131.0 lb

## 2016-04-09 DIAGNOSIS — R7989 Other specified abnormal findings of blood chemistry: Secondary | ICD-10-CM

## 2016-04-09 DIAGNOSIS — R7309 Other abnormal glucose: Secondary | ICD-10-CM | POA: Diagnosis not present

## 2016-04-09 DIAGNOSIS — J4521 Mild intermittent asthma with (acute) exacerbation: Secondary | ICD-10-CM

## 2016-04-09 DIAGNOSIS — I10 Essential (primary) hypertension: Secondary | ICD-10-CM

## 2016-04-09 DIAGNOSIS — R946 Abnormal results of thyroid function studies: Secondary | ICD-10-CM

## 2016-04-09 DIAGNOSIS — R739 Hyperglycemia, unspecified: Secondary | ICD-10-CM

## 2016-04-09 HISTORY — DX: Other specified abnormal findings of blood chemistry: R79.89

## 2016-04-09 NOTE — Assessment & Plan Note (Signed)
A1c 6 weeks

## 2016-04-09 NOTE — Progress Notes (Signed)
Subjective:  Patient ID: [name], female    DOB: July 10, 1945  Age: 71 y.o. MRN: [id number]  CC: Hypertension; osteopenia; Asthma; and Hyperglycemia   HPI [name] presents for HTN, asthma, OA f/u  Outpatient Medications Prior to Visit  Medication Sig Dispense Refill  . aspirin EC 81 MG tablet Take by mouth.    Marland Kitchen azithromycin (ZITHROMAX Z-PAK) 250 MG tablet As directed 6 each 0  . beclomethasone (QVAR) 80 MCG/ACT inhaler Inhale 2 puffs into the lungs 2 (two) times daily. 8.7 g 5  . Calcium Citrate (CITRACAL PO) Take by mouth daily.    . cetirizine (ZYRTEC) 10 MG tablet Take 10 mg by mouth as needed.     Marland Kitchen EPINEPHrine (EPIPEN 2-PAK) 0.3 mg/0.3 mL IJ SOAJ injection As directed 1 Device 3  . estradiol (ESTRACE) 0.1 MG/GM vaginal cream Place 2 g vaginally 3 (three) times a week.     Marland Kitchen ipratropium (ATROVENT HFA) 17 MCG/ACT inhaler Inhale 2 puffs into the lungs every 4 (four) hours as needed for wheezing. 1 Inhaler 5  . MEGARED OMEGA-3 KRILL OIL 500 MG CAPS Take 1 capsule by mouth every morning. 100 capsule 3  . olmesartan (BENICAR) 40 MG tablet Take 1 tablet (40 mg total) by mouth daily. 30 tablet 11  . Pediatric Multiple Vit-C-FA (MULTIVITAMIN ANIMAL SHAPES, WITH CA/FA,) WITH C & FA CHEW Chew 1 tablet by mouth daily.      . promethazine (PHENERGAN) 25 MG tablet Take 1 tablet (25 mg total) by mouth every 4 (four) hours as needed for nausea or vomiting. 30 tablet 0  . pseudoephedrine (SUDAFED) 30 MG tablet Take 2 tablets (60 mg total) by mouth every 4 (four) hours as needed for congestion. 60 tablet 2  . solifenacin (VESICARE) 5 MG tablet daily as needed. Take 1 tablet by mouth daily.  Ok to increase to 2 if no side effects    . traMADol (ULTRAM) 50 MG tablet Take by mouth as needed.    Marland Kitchen QVAR 80 MCG/ACT inhaler INHALE 2 PUFFS BY MOUTH EVERY DAY 8.7 g 0   No facility-administered medications prior to visit.     ROS Review of Systems  Constitutional: Negative for activity change,  appetite change, chills, fatigue and unexpected weight change.  HENT: Negative for congestion, mouth sores and sinus pressure.   Eyes: Negative for visual disturbance.  Respiratory: Negative for cough and chest tightness.   Gastrointestinal: Negative for abdominal pain and nausea.  Genitourinary: Negative for difficulty urinating, frequency and vaginal pain.  Musculoskeletal: Negative for back pain and gait problem.  Skin: Negative for pallor and rash.  Neurological: Negative for dizziness, tremors, weakness, numbness and headaches.  Psychiatric/Behavioral: Negative for confusion, sleep disturbance and suicidal ideas.    Objective:  BP (!) 128/56   Pulse 75   Temp 98.5 F (36.9 C) (Oral)   Resp 16   Ht 5\' 1"  (1.549 m)   Wt 131 lb (59.4 kg)   SpO2 98%   BMI 24.75 kg/m   BP Readings from Last 3 Encounters:  04/09/16 (!) 128/56  12/11/15 128/70  05/08/15 (!) 150/70    Wt Readings from Last 3 Encounters:  04/09/16 131 lb (59.4 kg)  12/11/15 136 lb 12 oz (62 kg)  05/08/15 132 lb (59.9 kg)    Physical Exam  Constitutional: She appears well-developed. No distress.  HENT:  Head: Normocephalic.  Right Ear: External ear normal.  Left Ear: External ear normal.  Nose: Nose normal.  Mouth/Throat: Oropharynx is clear and moist.  Eyes: Conjunctivae are normal. Pupils are equal, round, and reactive to light. Right eye exhibits no discharge. Left eye exhibits no discharge.  Neck: Normal range of motion. Neck supple. No JVD present. No tracheal deviation present. No thyromegaly present.  Cardiovascular: Normal rate, regular rhythm and normal heart sounds.   Pulmonary/Chest: No stridor. No respiratory distress. She has no wheezes.  Abdominal: Soft. Bowel sounds are normal. She exhibits no distension and no mass. There is no tenderness. There is no rebound and no guarding.  Musculoskeletal: She exhibits no edema or tenderness.  Lymphadenopathy:    She has no cervical adenopathy.    Neurological: She displays normal reflexes. No cranial nerve deficit. She exhibits normal muscle tone. Coordination normal.  Skin: No rash noted. No erythema.  Psychiatric: She has a normal mood and affect. Her behavior is normal. Judgment and thought content normal.    Lab Results  Component Value Date   WBC 4.5 04/08/2016   HGB 12.3 04/08/2016   HCT 36.3 04/08/2016   PLT 258.0 04/08/2016   GLUCOSE 105 (H) 04/08/2016   CHOL 236 (H) 04/08/2016   TRIG 130.0 04/08/2016   HDL 70.60 04/08/2016   LDLDIRECT 126.2 10/06/2012   LDLCALC 140 (H) 04/08/2016   ALT 14 04/08/2016   AST 15 04/08/2016   NA 135 04/08/2016   K 4.5 04/08/2016   CL 101 04/08/2016   CREATININE 1.05 04/08/2016   BUN 13 04/08/2016   CO2 26 04/08/2016   TSH 4.80 (H) 04/08/2016   INR 0.95 07/20/2010   HGBA1C 5.8 05/31/2015    Dg Chest 2 View  Result Date: 04/24/2015 CLINICAL DATA:  Cough and wheezing.  Short of breath. EXAM: CHEST  2 VIEW COMPARISON:  None. FINDINGS: The heart size and mediastinal contours are within normal limits. Both lungs are clear. The visualized skeletal structures are unremarkable. IMPRESSION: No active cardiopulmonary disease. Electronically Signed   By: Franchot Gallo M.D.   On: 04/24/2015 14:18    Assessment & Plan:   There are no diagnoses linked to this encounter. I have discontinued [name]'s QVAR. I am also having her maintain her estradiol, cetirizine, multivitamin animal shapes (with Ca/FA), promethazine, pseudoephedrine, solifenacin, aspirin EC, traMADol, EPINEPHrine, beclomethasone, ipratropium, Calcium Citrate (CITRACAL PO), olmesartan, azithromycin, and MEGARED OMEGA-3 KRILL OIL.  No orders of the defined types were placed in this encounter.    Follow-up: No Follow-up on file.  Walker Kehr, MD

## 2016-04-09 NOTE — Assessment & Plan Note (Signed)
TSH, FT4 in 6 wks

## 2016-04-09 NOTE — Assessment & Plan Note (Signed)
Benicar 

## 2016-04-09 NOTE — Assessment & Plan Note (Signed)
Atrovent  Maxair

## 2016-04-09 NOTE — Progress Notes (Signed)
Pre-visit discussion using our clinic review tool. No additional management support is needed unless otherwise documented below in the visit note.  

## 2016-05-05 ENCOUNTER — Other Ambulatory Visit: Payer: Self-pay | Admitting: *Deleted

## 2016-05-05 MED ORDER — BECLOMETHASONE DIPROPIONATE 80 MCG/ACT IN AERS: 2.0000 | INHALATION_SPRAY | Freq: Two times a day (BID) | RESPIRATORY_TRACT | 5 refills | Status: DC

## 2016-05-19 ENCOUNTER — Other Ambulatory Visit (INDEPENDENT_AMBULATORY_CARE_PROVIDER_SITE_OTHER): Payer: 59

## 2016-05-19 DIAGNOSIS — R7309 Other abnormal glucose: Secondary | ICD-10-CM | POA: Diagnosis not present

## 2016-05-19 LAB — HEMOGLOBIN A1C: Hgb A1c MFr Bld: 5.9 % (ref 4.6–6.5)

## 2016-05-19 LAB — TSH: TSH: 2.51 u[IU]/mL (ref 0.35–4.50)

## 2016-05-19 LAB — T4, FREE: Free T4: 0.59 ng/dL — ABNORMAL LOW (ref 0.60–1.60)

## 2016-06-16 ENCOUNTER — Telehealth: Payer: Self-pay | Admitting: Internal Medicine

## 2016-06-16 NOTE — Telephone Encounter (Signed)
Patient is wanting to speak to a nurse about her immunizations. She is going to be going out of the country. I was willing to help her with this. But she continue to asked questions I could not advise on. I also had her set up her mychart so she could see them on there as well. If you would please follow up with her once you can. Thank you.

## 2016-06-16 NOTE — Telephone Encounter (Signed)
Called patient and will look for records of hep A, hep B, Meningococcal, MMR, and Polio

## 2016-06-16 NOTE — Telephone Encounter (Signed)
Pt notified, was unable to find any of the immunizations.

## 2016-06-27 ENCOUNTER — Encounter: Payer: Self-pay | Admitting: Internal Medicine

## 2016-07-07 ENCOUNTER — Other Ambulatory Visit: Payer: Self-pay | Admitting: Internal Medicine

## 2016-07-07 DIAGNOSIS — Z7185 Encounter for immunization safety counseling: Secondary | ICD-10-CM

## 2016-07-07 DIAGNOSIS — Z7189 Other specified counseling: Secondary | ICD-10-CM

## 2016-07-07 DIAGNOSIS — Z7721 Contact with and (suspected) exposure to potentially hazardous body fluids: Secondary | ICD-10-CM

## 2016-07-10 ENCOUNTER — Encounter: Payer: Self-pay | Admitting: Internal Medicine

## 2016-07-14 ENCOUNTER — Other Ambulatory Visit: Payer: Self-pay | Admitting: Internal Medicine

## 2016-07-14 MED ORDER — FLUTICASONE FUROATE 100 MCG/ACT IN AEPB: 100.0000 ug | INHALATION_SPRAY | Freq: Every day | RESPIRATORY_TRACT | 11 refills | Status: DC

## 2016-07-25 ENCOUNTER — Encounter: Payer: Self-pay | Admitting: Family Medicine

## 2016-07-25 ENCOUNTER — Ambulatory Visit (INDEPENDENT_AMBULATORY_CARE_PROVIDER_SITE_OTHER): Payer: 59 | Admitting: Family Medicine

## 2016-07-25 ENCOUNTER — Ambulatory Visit (INDEPENDENT_AMBULATORY_CARE_PROVIDER_SITE_OTHER): Payer: 59 | Admitting: Internal Medicine

## 2016-07-25 VITALS — BP 156/85 | HR 73 | Temp 97.9°F | Wt 130.0 lb

## 2016-07-25 DIAGNOSIS — Z789 Other specified health status: Secondary | ICD-10-CM

## 2016-07-25 DIAGNOSIS — Z23 Encounter for immunization: Secondary | ICD-10-CM | POA: Diagnosis not present

## 2016-07-25 DIAGNOSIS — Z7184 Encounter for health counseling related to travel: Secondary | ICD-10-CM

## 2016-07-25 DIAGNOSIS — R42 Dizziness and giddiness: Secondary | ICD-10-CM | POA: Diagnosis not present

## 2016-07-25 DIAGNOSIS — Z7189 Other specified counseling: Secondary | ICD-10-CM

## 2016-07-25 DIAGNOSIS — Z9189 Other specified personal risk factors, not elsewhere classified: Secondary | ICD-10-CM | POA: Diagnosis not present

## 2016-07-25 MED ORDER — CIPROFLOXACIN HCL 500 MG PO TABS: 500.0000 mg | ORAL_TABLET | Freq: Two times a day (BID) | ORAL | 0 refills | Status: DC

## 2016-07-25 MED ORDER — MECLIZINE HCL 25 MG PO TABS: 25.0000 mg | ORAL_TABLET | ORAL | 0 refills | Status: DC | PRN

## 2016-07-25 MED ORDER — ATOVAQUONE-PROGUANIL HCL 250-100 MG PO TABS: 1.0000 | ORAL_TABLET | Freq: Every day | ORAL | 0 refills | Status: DC

## 2016-07-25 MED ORDER — PROMETHAZINE HCL 25 MG PO TABS: 25.0000 mg | ORAL_TABLET | ORAL | 0 refills | Status: DC | PRN

## 2016-07-25 NOTE — Progress Notes (Signed)
Subjective:   [name] is a 71 y.o. female who presents to the Infectious Disease clinic for travel consultation. Planned departure date: August 2018          Planned return date: 2 weeks Countries of travel: [place] Areas in country: urban   Accommodations: hotel Purpose of travel: [place] work Prior travel out of [place]: yes     Objective:   Medications: reviewed    Assessment:   No contraindications to travel. none     Plan:    Issues discussed: freshwater swimming, future shots, malaria, MVA safety, rabies, safe food/water, traveler's diarrhea, website/handouts for more information, what to do if ill upon return, what to do if ill while there and Yellow Fever. Immunizations recommended: Typhoid (parenteral).  Will also check for hepatitis A immunity and vaccinate if negative Malaria prophylaxis: malarone, daily dose starting 1-2 days before entering endemic area, ending 7 days after leaving area Traveler's diarrhea prophylaxis: ciprofloxacin. Total duration of visit: 1 Hour. Total time spent on education, counseling, coordination of care: 30 Minutes.

## 2016-07-25 NOTE — Patient Instructions (Signed)
WE NOW OFFER   [facility] FAST TRACK!!!  SAME DAY Appointments for ACUTE CARE  Such as: Sprains, Injuries, cuts, abrasions, rashes, muscle pain, joint pain, back pain Colds, flu, sore throats, headache, allergies, cough, fever  Ear pain, sinus and eye infections Abdominal pain, nausea, vomiting, diarrhea, upset stomach Animal/insect bites  3 Easy Ways to Schedule: Walk-In Scheduling Call in scheduling Mychart Sign-up: [email or web address]         

## 2016-07-25 NOTE — Progress Notes (Signed)
   Subjective:    Patient ID: [name], female    DOB: June 19, 1945, 71 y.o.   MRN: [id number]  HPI Here for 2 days of nausea without vomiting, a mild headache and mild dizziness. No fever or ST or cough. No abdominal pain. BMs normal.    Review of Systems  Constitutional: Negative.   HENT: Negative.   Eyes: Negative.   Respiratory: Negative.   Gastrointestinal: Positive for nausea. Negative for abdominal distention, abdominal pain, anal bleeding, blood in stool, constipation, diarrhea, rectal pain and vomiting.  Genitourinary: Negative.   Neurological: Positive for dizziness and headaches.       Objective:   Physical Exam  Constitutional: She is oriented to person, place, and time. She appears well-developed and well-nourished. No distress.  HENT:  Right Ear: External ear normal.  Left Ear: External ear normal.  Nose: Nose normal.  Mouth/Throat: Oropharynx is clear and moist.  Eyes: Conjunctivae are normal.  Neck: No thyromegaly present.  Cardiovascular: Normal rate, regular rhythm, normal heart sounds and intact distal pulses.   Pulmonary/Chest: Effort normal and breath sounds normal. No respiratory distress. She has no wheezes. She has no rales. She exhibits no tenderness.  Abdominal: Soft. Bowel sounds are normal. She exhibits no distension and no mass. There is no tenderness. There is no rebound and no guarding.  Lymphadenopathy:    She has no cervical adenopathy.  Neurological: She is alert and oriented to person, place, and time.          Assessment & Plan:  Viral URI, possibly early vertigo. Treat with Meclizine and Phenergan. Drink fluids. Recheck prn.  Alysia Penna, MD

## 2016-07-25 NOTE — Patient Instructions (Signed)
[facility]                [address] [place]                   [place], [place] [address]                      Phone: [phone]                        Fax: [phone]   Planned departure date: August 2018          Planned return date: 2 weeks Countries of travel: [place]   Guidelines for the Prevention & Treatment of Traveler's Diarrhea  Prevention: "Boil it, Peel it, Lacinda Axon it, or Forget it"   the fewer chances -> lower risk: try to stick to food & water precautions as much as possible"   If it's "piping hot"; it is probably okay, if not, it may not be   Treatment   1) You should always take care to drink lots of fluids in order to avoid dehydration   2) You should bring medications with you in case you come down with a case of diarrhea   3) OTC = bring pepto-bismol - can take with initial abdominal symptoms;                    Imodium - can help slow down your intestinal tract, can help relief cramps                    and diarrhea, can take if no bloody diarrhea  Use ciprofloxacin if needed for traveler's diarrhea  Guidelines for the Prevention of Malaria  Avoidance:  -fewer mosquito bites = lower risk. Mosquitos can bite at night as well as daytime  -cover up (long sleeve clothing), mosquito nets, screens  -Insect repellent for your skin ( DEET containing lotion > 20%): for clothes ( permethrin spray)   2 days prior to travel start malarone, daily dose starting 1-2 days before entering endemic area, ending 7 days after leaving area for malaria prevention.   Immunizations received today: Typhoid (parenteral)  Future immunizations, if indicated none indicated, hepatitis A if Ab does not confirm immunity   Prior to travel:  1) Be sure to pick up appropriate prescriptions, including medicine you take daily. Do not expect to be able to fill your prescriptions abroad.  2) Strongly consider obtaining traveler's  insurance, including emergency evacuation insurance. Most plans in the [place] do not cover participants abroad. (see below for resources)  3) Register at the appropriate [place] embassy or consulate with travel dates so they are aware of your presence in-country and for helpful advice during travel using the [employer] (STEP, GreenNylon.com.cy).  4) Leave contact information with a relative or friend.  5) Keep a Research officer, political party, credit cards in case they become lost or stolen  6) Inform your credit card company that you will be travelling abroad   During travel:  1) If you become ill and need medical advice, the [place] [employer]	[place] of the country you are traveling in general provides a list of [facility] speaking doctors.  We are also available on MyChart for remote consultation if you register prior to travel. 2) Avoid motorcycles or scooters when at all possible. Traffic laws in many countries are lax and  accidents occur frequently.  3) Do not take any unnecessary risks that you wouldn't do at home.   Resources:  -Country specific information: BlindResource.ca or GreenNylon.com.cy  -Press photographer (DEET, mosquito nets): [employer], [employer] store, [employer], Chunky insurance options: gatewayplans.com; http://clayton-rivera.info/; travelguard.com or Good [employer], gninsurance.com or info@gninsurance .com, [id number].   Post Travel:  If you return from your trip ill, call your primary care doctor or our travel clinic @ [phone].   Enjoy your trip and know that with proper pre-travel preparation, most people have an enjoyable and uninterrupted trip!

## 2016-07-26 LAB — HEPATITIS A ANTIBODY, TOTAL: Hep A Total Ab: REACTIVE — AB

## 2016-07-30 MED ORDER — FLUTICASONE FUROATE 100 MCG/ACT IN AEPB: 100.0000 ug | INHALATION_SPRAY | Freq: Every day | RESPIRATORY_TRACT | 11 refills | Status: DC

## 2016-08-13 ENCOUNTER — Other Ambulatory Visit: Payer: 59

## 2016-08-15 ENCOUNTER — Encounter: Payer: Self-pay | Admitting: Internal Medicine

## 2016-08-15 ENCOUNTER — Ambulatory Visit (INDEPENDENT_AMBULATORY_CARE_PROVIDER_SITE_OTHER): Payer: 59 | Admitting: Internal Medicine

## 2016-08-15 VITALS — BP 120/60 | HR 71 | Temp 98.5°F | Ht 61.0 in | Wt 130.0 lb

## 2016-08-15 DIAGNOSIS — R946 Abnormal results of thyroid function studies: Secondary | ICD-10-CM

## 2016-08-15 DIAGNOSIS — Z9189 Other specified personal risk factors, not elsewhere classified: Secondary | ICD-10-CM | POA: Diagnosis not present

## 2016-08-15 DIAGNOSIS — Z789 Other specified health status: Secondary | ICD-10-CM

## 2016-08-15 DIAGNOSIS — Z7189 Other specified counseling: Secondary | ICD-10-CM

## 2016-08-15 DIAGNOSIS — Z23 Encounter for immunization: Secondary | ICD-10-CM | POA: Diagnosis not present

## 2016-08-15 DIAGNOSIS — J4521 Mild intermittent asthma with (acute) exacerbation: Secondary | ICD-10-CM

## 2016-08-15 DIAGNOSIS — R7989 Other specified abnormal findings of blood chemistry: Secondary | ICD-10-CM

## 2016-08-15 DIAGNOSIS — I10 Essential (primary) hypertension: Secondary | ICD-10-CM

## 2016-08-15 DIAGNOSIS — Z91013 Allergy to seafood: Secondary | ICD-10-CM | POA: Diagnosis not present

## 2016-08-15 DIAGNOSIS — Z7184 Encounter for health counseling related to travel: Secondary | ICD-10-CM

## 2016-08-15 MED ORDER — AZITHROMYCIN 250 MG PO TABS: ORAL_TABLET | ORAL | 0 refills | Status: DC

## 2016-08-15 MED ORDER — EPINEPHRINE 0.3 MG/0.3ML IJ SOAJ: INTRAMUSCULAR | 3 refills | Status: DC

## 2016-08-15 MED ORDER — FLUTICASONE FUROATE 100 MCG/ACT IN AEPB: 100.0000 ug | INHALATION_SPRAY | Freq: Every day | RESPIRATORY_TRACT | 11 refills | Status: DC

## 2016-08-15 MED ORDER — AZITHROMYCIN 500 MG PO TABS: 500.0000 mg | ORAL_TABLET | Freq: Every day | ORAL | 0 refills | Status: DC

## 2016-08-15 MED ORDER — CIPROFLOXACIN HCL 500 MG PO TABS: 500.0000 mg | ORAL_TABLET | Freq: Two times a day (BID) | ORAL | 1 refills | Status: AC

## 2016-08-15 MED ORDER — PREDNISONE 10 MG PO TABS: ORAL_TABLET | ORAL | 1 refills | Status: DC

## 2016-08-15 NOTE — Assessment & Plan Note (Addendum)
Repeat labs Off Biotin

## 2016-08-15 NOTE — Progress Notes (Signed)
    Patient ID: [name], female   DOB: 09/21/1945, 71 y.o.   MRN: [id number]  HPI 71yo F coming in for pretravel counseling. Came 2 weeks ago for pre travel counseling for [place] (accidentally labeled as [place] in the last note) but going to [place] for 2wk leaving sept 6th. Providing health care with RN students  She has received some of her YF vaccine at [facility]. Has typhoid, hep a, and hep b immunization  Plan:  1) recommend to get meningococcal vaccination 2) will give rx for azithromycin for traveler's diarrhea 3) will make sure she has #23 tabs of malarone for her malaria proph 4) also recommended to use DEET and premethrin for mosquito bite prevention 5) avoid petting animals while on safari[place]game reservation

## 2016-08-15 NOTE — Progress Notes (Signed)
Subjective:  Patient ID: [name], female    DOB: 06-14-1945  Age: 71 y.o. MRN: [id number]  CC: No chief complaint on file.   HPI [name] presents for asthma, allergies, HTN f/u  Going to [place] x 2 weeks and later to [place]  Outpatient Medications Prior to Visit  Medication Sig Dispense Refill  . aspirin EC 81 MG tablet Take by mouth.    . Calcium Citrate (CITRACAL PO) Take by mouth daily.    . cetirizine (ZYRTEC) 10 MG tablet Take 10 mg by mouth as needed.     Marland Kitchen EPINEPHrine (EPIPEN 2-PAK) 0.3 mg/0.3 mL IJ SOAJ injection As directed 1 Device 3  . estradiol (ESTRACE) 0.1 MG/GM vaginal cream Place 2 g vaginally 3 (three) times a week.     . Fluticasone Furoate (ARNUITY ELLIPTA) 100 MCG/ACT AEPB Inhale 100 mcg into the lungs daily. 30 each 11  . ipratropium (ATROVENT HFA) 17 MCG/ACT inhaler Inhale 2 puffs into the lungs every 4 (four) hours as needed for wheezing. 1 Inhaler 5  . MEGARED OMEGA-3 KRILL OIL 500 MG CAPS Take 1 capsule by mouth every morning. 100 capsule 3  . olmesartan (BENICAR) 40 MG tablet Take 1 tablet (40 mg total) by mouth daily. 30 tablet 11  . olopatadine (PATANOL) 0.1 % ophthalmic solution Apply to eye.    Marland Kitchen Pediatric Multiple Vit-C-FA (MULTIVITAMIN ANIMAL SHAPES, WITH CA/FA,) WITH C & FA CHEW Chew 1 tablet by mouth daily.      . solifenacin (VESICARE) 5 MG tablet daily as needed. Take 1 tablet by mouth daily.  Ok to increase to 2 if no side effects    . traMADol (ULTRAM) 50 MG tablet Take by mouth as needed.    Marland Kitchen atovaquone-proguanil (MALARONE) 250-100 MG TABS tablet Take 1 tablet by mouth daily. Start 2 days prior to travel to malaria area, throughout travel and for 7 days upon return. 23 tablet 0  . azithromycin (ZITHROMAX) 500 MG tablet Take 1 tablet (500 mg total) by mouth daily. If you have 3+loose stools/24hr. Can stop taking if diarrhea resolves 5 tablet 0  . ciprofloxacin (CIPRO) 500 MG tablet Take 1 tablet (500 mg total) by mouth 2 (two) times  daily. Take 1-2 days until diarrhea resolves (Patient not taking: Reported on 07/25/2016) 8 tablet 0  . meclizine (ANTIVERT) 25 MG tablet Take 1 tablet (25 mg total) by mouth every 4 (four) hours as needed for dizziness. 60 tablet 0  . promethazine (PHENERGAN) 25 MG tablet Take 1 tablet (25 mg total) by mouth every 4 (four) hours as needed for nausea or vomiting. 60 tablet 0  . pseudoephedrine (SUDAFED) 30 MG tablet Take 2 tablets (60 mg total) by mouth every 4 (four) hours as needed for congestion. 60 tablet 2   No facility-administered medications prior to visit.     ROS Review of Systems  Constitutional: Negative for activity change, appetite change, chills, fatigue and unexpected weight change.  HENT: Negative for congestion, mouth sores and sinus pressure.   Eyes: Negative for visual disturbance.  Respiratory: Negative for cough and chest tightness.   Gastrointestinal: Negative for abdominal pain and nausea.  Genitourinary: Negative for difficulty urinating, frequency and vaginal pain.  Musculoskeletal: Negative for back pain and gait problem.  Skin: Negative for pallor and rash.  Neurological: Negative for dizziness, tremors, weakness, numbness and headaches.  Psychiatric/Behavioral: Negative for confusion and sleep disturbance.    Objective:  BP 120/60 (BP Location: Left Arm, Patient Position:  Sitting, Cuff Size: Normal)   Pulse 71   Temp 98.5 F (36.9 C) (Oral)   Ht 5\' 1"  (1.549 m)   Wt 130 lb (59 kg)   SpO2 99%   BMI 24.56 kg/m   BP Readings from Last 3 Encounters:  08/15/16 120/60  07/25/16 (!) 156/85  04/09/16 (!) 128/56    Wt Readings from Last 3 Encounters:  08/15/16 130 lb (59 kg)  07/25/16 130 lb (59 kg)  04/09/16 131 lb (59.4 kg)    Physical Exam  Constitutional: She appears well-developed. No distress.  HENT:  Head: Normocephalic.  Right Ear: External ear normal.  Left Ear: External ear normal.  Nose: Nose normal.  Mouth/Throat: Oropharynx is clear  and moist.  Eyes: Pupils are equal, round, and reactive to light. Conjunctivae are normal. Right eye exhibits no discharge. Left eye exhibits no discharge.  Neck: Normal range of motion. Neck supple. No JVD present. No tracheal deviation present. No thyromegaly present.  Cardiovascular: Normal rate, regular rhythm and normal heart sounds.   Pulmonary/Chest: No stridor. No respiratory distress. She has no wheezes.  Abdominal: Soft. Bowel sounds are normal. She exhibits no distension and no mass. There is no tenderness. There is no rebound and no guarding.  Musculoskeletal: She exhibits no edema or tenderness.  Lymphadenopathy:    She has no cervical adenopathy.  Neurological: She displays normal reflexes. No cranial nerve deficit. She exhibits normal muscle tone. Coordination normal.  Skin: No rash noted. No erythema.  Psychiatric: She has a normal mood and affect. Her behavior is normal. Judgment and thought content normal.  lesion on nose  Lab Results  Component Value Date   WBC 4.5 04/08/2016   HGB 12.3 04/08/2016   HCT 36.3 04/08/2016   PLT 258.0 04/08/2016   GLUCOSE 105 (H) 04/08/2016   CHOL 236 (H) 04/08/2016   TRIG 130.0 04/08/2016   HDL 70.60 04/08/2016   LDLDIRECT 126.2 10/06/2012   LDLCALC 140 (H) 04/08/2016   ALT 14 04/08/2016   AST 15 04/08/2016   NA 135 04/08/2016   K 4.5 04/08/2016   CL 101 04/08/2016   CREATININE 1.05 04/08/2016   BUN 13 04/08/2016   CO2 26 04/08/2016   TSH 2.51 05/19/2016   INR 0.95 07/20/2010   HGBA1C 5.9 05/19/2016    Dg Chest 2 View  Result Date: 04/24/2015 CLINICAL DATA:  Cough and wheezing.  Short of breath. EXAM: CHEST  2 VIEW COMPARISON:  None. FINDINGS: The heart size and mediastinal contours are within normal limits. Both lungs are clear. The visualized skeletal structures are unremarkable. IMPRESSION: No active cardiopulmonary disease. Electronically Signed   By: Franchot Gallo M.D.   On: 04/24/2015 14:18    Assessment & Plan:    There are no diagnoses linked to this encounter. I have discontinued Ms. Goodrich's pseudoephedrine, atovaquone-proguanil, ciprofloxacin, promethazine, meclizine, and azithromycin. I am also having her maintain her estradiol, cetirizine, multivitamin animal shapes (with Ca/FA), solifenacin, aspirin EC, traMADol, EPINEPHrine, ipratropium, Calcium Citrate (CITRACAL PO), olmesartan, MEGARED OMEGA-3 KRILL OIL, olopatadine, and Fluticasone Furoate.  No orders of the defined types were placed in this encounter.    Follow-up: No Follow-up on file.  Walker Kehr, MD

## 2016-08-17 NOTE — Assessment & Plan Note (Signed)
Olmesartan

## 2016-08-17 NOTE — Assessment & Plan Note (Signed)
Epi pen 

## 2016-08-17 NOTE — Assessment & Plan Note (Signed)
[place], [place] Cipro, Z pac prn

## 2016-08-17 NOTE — Assessment & Plan Note (Signed)
Arnuity ellipta qd 

## 2016-10-16 ENCOUNTER — Ambulatory Visit (INDEPENDENT_AMBULATORY_CARE_PROVIDER_SITE_OTHER): Payer: 59 | Admitting: *Deleted

## 2016-10-16 DIAGNOSIS — Z23 Encounter for immunization: Secondary | ICD-10-CM | POA: Diagnosis not present

## 2016-12-11 ENCOUNTER — Other Ambulatory Visit: Payer: Self-pay | Admitting: Internal Medicine

## 2016-12-11 MED ORDER — OLMESARTAN MEDOXOMIL 40 MG PO TABS: 40.0000 mg | ORAL_TABLET | Freq: Every day | ORAL | 11 refills | Status: DC

## 2016-12-11 NOTE — Telephone Encounter (Signed)
Pt had OV 08/15/16

## 2016-12-11 NOTE — Telephone Encounter (Signed)
Copied from [place] [id number]. Topic: Quick Communication - See Telephone Encounter >> Dec 11, 2016 12:58 PM Cleaster Corin, NT wrote: CRM for notification. See Telephone encounter for:   12/11/16. Pt. Needs refill on her B/P med. Which is has been out of since yesterday (olmesarten 40mg )   [facility] [phone] - [place], [place] [address] [place] [address] Phone: [phone] Fax: [phone] Not a 24 hour pharmacy; exact hours not known

## 2016-12-15 ENCOUNTER — Other Ambulatory Visit (INDEPENDENT_AMBULATORY_CARE_PROVIDER_SITE_OTHER): Payer: 59

## 2016-12-15 ENCOUNTER — Encounter: Payer: Self-pay | Admitting: Internal Medicine

## 2016-12-15 ENCOUNTER — Ambulatory Visit (INDEPENDENT_AMBULATORY_CARE_PROVIDER_SITE_OTHER): Payer: 59 | Admitting: Internal Medicine

## 2016-12-15 VITALS — BP 124/62 | HR 73 | Temp 98.6°F | Ht 61.0 in | Wt 132.0 lb

## 2016-12-15 DIAGNOSIS — Z0000 Encounter for general adult medical examination without abnormal findings: Secondary | ICD-10-CM | POA: Diagnosis not present

## 2016-12-15 DIAGNOSIS — N281 Cyst of kidney, acquired: Secondary | ICD-10-CM

## 2016-12-15 DIAGNOSIS — I10 Essential (primary) hypertension: Secondary | ICD-10-CM | POA: Diagnosis not present

## 2016-12-15 DIAGNOSIS — J4521 Mild intermittent asthma with (acute) exacerbation: Secondary | ICD-10-CM

## 2016-12-15 DIAGNOSIS — R7989 Other specified abnormal findings of blood chemistry: Secondary | ICD-10-CM | POA: Diagnosis not present

## 2016-12-15 DIAGNOSIS — Z7721 Contact with and (suspected) exposure to potentially hazardous body fluids: Secondary | ICD-10-CM

## 2016-12-15 DIAGNOSIS — Z7185 Encounter for immunization safety counseling: Secondary | ICD-10-CM

## 2016-12-15 DIAGNOSIS — Z7189 Other specified counseling: Secondary | ICD-10-CM

## 2016-12-15 LAB — BASIC METABOLIC PANEL
BUN: 26 mg/dL — ABNORMAL HIGH (ref 6–23)
CO2: 24 mEq/L (ref 19–32)
Calcium: 9.7 mg/dL (ref 8.4–10.5)
Chloride: 104 mEq/L (ref 96–112)
Creatinine, Ser: 1.06 mg/dL (ref 0.40–1.20)
GFR: 54.55 mL/min — ABNORMAL LOW (ref >60.00)
Glucose, Bld: 96 mg/dL (ref 70–99)
Potassium: 4.5 mEq/L (ref 3.5–5.1)
Sodium: 138 mEq/L (ref 135–145)

## 2016-12-15 LAB — TSH: TSH: 2.99 u[IU]/mL (ref 0.35–4.50)

## 2016-12-15 LAB — T4, FREE: Free T4: 0.62 ng/dL (ref 0.60–1.60)

## 2016-12-15 MED ORDER — FLUTICASONE FUROATE 100 MCG/ACT IN AEPB: 100.0000 ug | INHALATION_SPRAY | Freq: Every day | RESPIRATORY_TRACT | 11 refills | Status: DC

## 2016-12-15 NOTE — Assessment & Plan Note (Signed)
Arnuity

## 2016-12-15 NOTE — Assessment & Plan Note (Signed)
Abd US 

## 2016-12-15 NOTE — Progress Notes (Signed)
Subjective:  Patient ID: [name], female    DOB: [birth year]  Age: 71 y.o. MRN: [id number]  CC: No chief complaint on file.   HPI LAKENA SPARLIN presents for HTN, asthma, thor back pain f/u  Outpatient Medications Prior to Visit  Medication Sig Dispense Refill  . aspirin EC 81 MG tablet Take by mouth.    . Calcium Citrate (CITRACAL PO) Take by mouth daily.    . cetirizine (ZYRTEC) 10 MG tablet Take 10 mg by mouth as needed.     Marland Kitchen EPINEPHrine (EPIPEN 2-PAK) 0.3 mg/0.3 mL IJ SOAJ injection As directed 1 Device 3  . estradiol (ESTRACE) 0.1 MG/GM vaginal cream Place 2 g vaginally 3 (three) times a week.     . Fluticasone Furoate (ARNUITY ELLIPTA) 100 MCG/ACT AEPB Inhale 100 mcg into the lungs daily. 30 each 11  . MEGARED OMEGA-3 KRILL OIL 500 MG CAPS Take 1 capsule by mouth every morning. 100 capsule 3  . olmesartan (BENICAR) 40 MG tablet Take 1 tablet (40 mg total) by mouth daily. 30 tablet 11  . olopatadine (PATANOL) 0.1 % ophthalmic solution Apply to eye.    Marland Kitchen Pediatric Multiple Vit-C-FA (MULTIVITAMIN ANIMAL SHAPES, WITH CA/FA,) WITH C & FA CHEW Chew 1 tablet by mouth daily.      . predniSONE (DELTASONE) 10 MG tablet Prednisone 10 mg: take 4 tabs a day x 3 days; then 3 tabs a day x 4 days; then 2 tabs a day x 4 days, then 1 tab a day x 6 days, then stop. Take pc. 38 tablet 1  . solifenacin (VESICARE) 5 MG tablet daily as needed. Take 1 tablet by mouth daily.  Ok to increase to 2 if no side effects    . traMADol (ULTRAM) 50 MG tablet Take by mouth as needed.    Marland Kitchen azithromycin (ZITHROMAX Z-PAK) 250 MG tablet As directed 6 each 0   No facility-administered medications prior to visit.     ROS Review of Systems  Constitutional: Negative for activity change, appetite change, chills, fatigue and unexpected weight change.  HENT: Negative for congestion, mouth sores and sinus pressure.   Eyes: Negative for visual disturbance.  Respiratory: Negative for cough and chest tightness.     Gastrointestinal: Negative for abdominal pain and nausea.  Genitourinary: Negative for difficulty urinating, frequency and vaginal pain.  Musculoskeletal: Positive for back pain. Negative for gait problem.  Skin: Negative for pallor and rash.  Neurological: Negative for dizziness, tremors, weakness, numbness and headaches.  Psychiatric/Behavioral: Negative for confusion and sleep disturbance.    Objective:  BP 124/62 (BP Location: Left Arm, Patient Position: Sitting, Cuff Size: Normal)   Pulse 73   Temp 98.6 F (37 C) (Oral)   Ht 5\' 1"  (1.549 m)   Wt 132 lb (59.9 kg)   SpO2 99%   BMI 24.94 kg/m   BP Readings from Last 3 Encounters:  12/15/16 124/62  08/15/16 120/60  07/25/16 (!) 156/85    Wt Readings from Last 3 Encounters:  12/15/16 132 lb (59.9 kg)  08/15/16 130 lb (59 kg)  07/25/16 130 lb (59 kg)    Physical Exam  Constitutional: She appears well-developed. No distress.  HENT:  Head: Normocephalic.  Right Ear: External ear normal.  Left Ear: External ear normal.  Nose: Nose normal.  Mouth/Throat: Oropharynx is clear and moist.  Eyes: Conjunctivae are normal. Pupils are equal, round, and reactive to light. Right eye exhibits no discharge. Left eye exhibits no discharge.  Neck: Normal range of motion. Neck supple. No JVD present. No tracheal deviation present. No thyromegaly present.  Cardiovascular: Normal rate, regular rhythm and normal heart sounds.  Pulmonary/Chest: No stridor. No respiratory distress. She has no wheezes.  Abdominal: Soft. Bowel sounds are normal. She exhibits no distension and no mass. There is no tenderness. There is no rebound and no guarding.  Musculoskeletal: She exhibits tenderness. She exhibits no edema.  Lymphadenopathy:    She has no cervical adenopathy.  Neurological: She displays normal reflexes. No cranial nerve deficit. She exhibits normal muscle tone. Coordination normal.  Skin: No rash noted. No erythema.  Psychiatric: She has a  normal mood and affect. Her behavior is normal. Judgment and thought content normal.  thor back tender over rhomboid muscles  Lab Results  Component Value Date   WBC 4.5 04/08/2016   HGB 12.3 04/08/2016   HCT 36.3 04/08/2016   PLT 258.0 04/08/2016   GLUCOSE 96 12/15/2016   CHOL 236 (H) 04/08/2016   TRIG 130.0 04/08/2016   HDL 70.60 04/08/2016   LDLDIRECT 126.2 10/06/2012   LDLCALC 140 (H) 04/08/2016   ALT 14 04/08/2016   AST 15 04/08/2016   NA 138 12/15/2016   K 4.5 12/15/2016   CL 104 12/15/2016   CREATININE 1.06 12/15/2016   BUN 26 (H) 12/15/2016   CO2 24 12/15/2016   TSH 2.99 12/15/2016   INR 0.95 07/20/2010   HGBA1C 5.9 05/19/2016    Dg Chest 2 View  Result Date: 04/24/2015 CLINICAL DATA:  Cough and wheezing.  Short of breath. EXAM: CHEST  2 VIEW COMPARISON:  None. FINDINGS: The heart size and mediastinal contours are within normal limits. Both lungs are clear. The visualized skeletal structures are unremarkable. IMPRESSION: No active cardiopulmonary disease. Electronically Signed   By: Franchot Gallo M.D.   On: 04/24/2015 14:18    Assessment & Plan:   There are no diagnoses linked to this encounter. I have discontinued Ardis L. Doo's azithromycin. I am also having her maintain her estradiol, cetirizine, multivitamin animal shapes (with Ca/FA), solifenacin, aspirin EC, traMADol, Calcium Citrate (CITRACAL PO), MEGARED OMEGA-3 KRILL OIL, olopatadine, Fluticasone Furoate, EPINEPHrine, predniSONE, and olmesartan.  No orders of the defined types were placed in this encounter.    Follow-up: No Follow-up on file.  Walker Kehr, MD

## 2016-12-15 NOTE — Assessment & Plan Note (Signed)
Monitor TSH 

## 2016-12-15 NOTE — Assessment & Plan Note (Signed)
On Benicar 

## 2016-12-16 LAB — HEPATITIS B SURFACE ANTIBODY,QUALITATIVE: Hep B S Ab: REACTIVE — AB

## 2017-01-14 ENCOUNTER — Encounter: Payer: Self-pay | Admitting: Internal Medicine

## 2017-01-23 DIAGNOSIS — M9428 Chondromalacia, other site: Secondary | ICD-10-CM | POA: Insufficient documentation

## 2017-01-23 DIAGNOSIS — M238X1 Other internal derangements of right knee: Secondary | ICD-10-CM | POA: Insufficient documentation

## 2017-02-13 ENCOUNTER — Ambulatory Visit (INDEPENDENT_AMBULATORY_CARE_PROVIDER_SITE_OTHER): Payer: 59 | Admitting: Family

## 2017-02-13 ENCOUNTER — Ambulatory Visit (INDEPENDENT_AMBULATORY_CARE_PROVIDER_SITE_OTHER)
Admission: RE | Admit: 2017-02-13 | Discharge: 2017-02-13 | Disposition: A | Discharge status: Home / Self Care | Payer: 59 | Source: Ambulatory Visit | Attending: Family | Admitting: Family

## 2017-02-13 ENCOUNTER — Encounter: Payer: Self-pay | Admitting: Family

## 2017-02-13 VITALS — BP 130/82 | HR 70 | Temp 98.8°F | Ht 61.0 in | Wt 131.0 lb

## 2017-02-13 DIAGNOSIS — R05 Cough: Secondary | ICD-10-CM | POA: Diagnosis not present

## 2017-02-13 DIAGNOSIS — R059 Cough, unspecified: Secondary | ICD-10-CM

## 2017-02-13 DIAGNOSIS — J45909 Unspecified asthma, uncomplicated: Secondary | ICD-10-CM | POA: Diagnosis not present

## 2017-02-13 MED ORDER — DOXYCYCLINE HYCLATE 100 MG PO TABS: 100.0000 mg | ORAL_TABLET | Freq: Two times a day (BID) | ORAL | 0 refills | Status: DC

## 2017-02-13 MED ORDER — METHYLPREDNISOLONE ACETATE 40 MG/ML IJ SUSP
40.0000 mg | Freq: Once | INTRAMUSCULAR | Status: AC
  Administered 2017-02-13: 40 mg via INTRAMUSCULAR
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
## 2017-02-19 ENCOUNTER — Ambulatory Visit: Payer: 59 | Admitting: Acute Care

## 2017-02-19 NOTE — Progress Notes (Deleted)
History of Present Illness [name] is a 72 y.o. female never smoker  with asthmatic bronchitis. She was seen last 04/2015 by Dr. Corrie Dandy. She is a previous patient of Dr. [name].  Allergy to albuterol>> anaphylaxis Maintenance: Arnuity Ellipta Rescue is Atrovent HFA, 2 puffs every 4 hrs as needed for dyspnea   She tried Anoro at last visit 04/2015  HPI: Seen 02/13/2017 by PCP: Patient presents with concerns for uncontrolled asthma/ worsening bronchitis; has documented history of asthma and recurrent asthmatic bronchitis; notes that keeps prescriptions for prednisone and Zithromax to use when her symptoms flare; she typically responds very well to this regimen; however, she is on 4th day of Prednisone 40 mg and completed Z-pak today and just feeling very "wheezy, congested." Does take Arnuity Ellipta daily for treatment of her asthma; albuterol is listed as allergy causing anaphylaxis but patient thinks she has taken "breathing treatments in the past." Denies any fever or chest pain; does feel that she is extremely tired and admits she is very worried that something else is causing her symptoms; wonders if she needs to see pulmonology- was under their care 10+ years ago;  Plan after that visit was: CXR 2/1 - no pneumonia seen;  Depo-Medrol IM 40 in office 2/1 ; finish oral prednisone taper as directed; Rx for Doxycycline 100 mg bid x 10 days; refer to pulmonology; follow-up worse, no better. Strict ER precautions discussed for upcoming weekend.   02/19/2017 Acute OV: Pt. Presents for Acute visit   Test Results:  CBC Latest Ref Rng & Units 04/08/2016 05/31/2015 05/03/2014  WBC 4.0 - 10.5 K/uL 4.5 5.0 4.1  Hemoglobin 12.0 - 15.0 g/dL 12.3 12.3 12.4  Hematocrit 36.0 - 46.0 % 36.3 35.4(L) 36.5  Platelets 150.0 - 400.0 K/uL 258.0 271.0 241.0    BMP Latest Ref Rng & Units 12/15/2016 04/08/2016 05/31/2015  Glucose 70 - 99 mg/dL 96 105(H) 101(H)  BUN 6 - 23 mg/dL 26(H) 13 26(H)  Creatinine 0.40 -  1.20 mg/dL 1.06 1.05 0.96  Sodium 135 - 145 mEq/L 138 135 139  Potassium 3.5 - 5.1 mEq/L 4.5 4.5 4.4  Chloride 96 - 112 mEq/L 104 101 106  CO2 19 - 32 mEq/L 24 26 27   Calcium 8.4 - 10.5 mg/dL 9.7 9.2 9.6    Dg Chest 2 View  Result Date: 02/13/2017 CLINICAL DATA:  Cough, congestion and shortness of breath for 4 days. EXAM: CHEST  2 VIEW COMPARISON:  04/24/2015 FINDINGS: The cardiac silhouette, mediastinal and hilar contours are normal and stable. The lungs are clear. Mild hyperinflation. No pulmonary lesions. No pleural effusion. The bony thorax is intact. IMPRESSION: Hyperinflation but no infiltrates, edema or effusions. Electronically Signed   By: Marijo Sanes M.D.   On: 02/13/2017 12:12     Past medical hx Past Medical History:  Diagnosis Date  . Allergic rhinitis    seasonal  . Asthma    -FeV1 106% 2006  . Hypertension   . Lower back pain   . Osteopenia   . Sinusitis   . Urinary tract infection, site not specified      Social History   Tobacco Use  . Smoking status: Never Smoker  . Smokeless tobacco: Never Used  Substance Use Topics  . Alcohol use: Yes    Alcohol/week: 1.2 oz    Types: 2 Glasses of wine per week  . Drug use: No    Ms.Vanmaanen reports that  has never smoked. she has never used smokeless  tobacco. She reports that she drinks about 1.2 oz of alcohol per week. She reports that she does not use drugs.  Tobacco Cessation: Counseling given: Not Answered   Past surgical hx, Family hx, Social hx all reviewed.  Current Outpatient Medications on File Prior to Visit  Medication Sig  . aspirin EC 81 MG tablet Take by mouth.  . Calcium Citrate (CITRACAL PO) Take by mouth daily.  . cetirizine (ZYRTEC) 10 MG tablet Take 10 mg by mouth as needed.   . doxycycline (VIBRA-TABS) 100 MG tablet Take 1 tablet (100 mg total) by mouth 2 (two) times daily.  Marland Kitchen EPINEPHrine (EPIPEN 2-PAK) 0.3 mg/0.3 mL IJ SOAJ injection As directed  . estradiol (ESTRACE) 0.1 MG/GM vaginal  cream Place 2 g vaginally 3 (three) times a week.   . Fluticasone Furoate (ARNUITY ELLIPTA) 100 MCG/ACT AEPB Inhale 100 mcg into the lungs daily.  Marland Kitchen MEGARED OMEGA-3 KRILL OIL 500 MG CAPS Take 1 capsule by mouth every morning.  . olmesartan (BENICAR) 40 MG tablet Take 1 tablet (40 mg total) by mouth daily.  Marland Kitchen olopatadine (PATANOL) 0.1 % ophthalmic solution Apply to eye.  Marland Kitchen Pediatric Multiple Vit-C-FA (MULTIVITAMIN ANIMAL SHAPES, WITH CA/FA,) WITH C & FA CHEW Chew 1 tablet by mouth daily.    . predniSONE (DELTASONE) 10 MG tablet Prednisone 10 mg: take 4 tabs a day x 3 days; then 3 tabs a day x 4 days; then 2 tabs a day x 4 days, then 1 tab a day x 6 days, then stop. Take pc.  . solifenacin (VESICARE) 5 MG tablet daily as needed. Take 1 tablet by mouth daily.  Ok to increase to 2 if no side effects  . traMADol (ULTRAM) 50 MG tablet Take by mouth as needed.   No current facility-administered medications on file prior to visit.      Allergies  Allergen Reactions  . Albuterol Anaphylaxis and Shortness Of Breath  . Oxycodone Nausea Only  . Amlodipine     Weak   . Codeine   . Hctz [Hydrochlorothiazide]     dizziness  . Hydromorphone Hcl   . Losartan     HA, weak    Review Of Systems:  Constitutional:   No  weight loss, night sweats,  Fevers, chills, fatigue, or  lassitude.  HEENT:   No headaches,  Difficulty swallowing,  Tooth/dental problems, or  Sore throat,                No sneezing, itching, ear ache, nasal congestion, post nasal drip,   CV:  No chest pain,  Orthopnea, PND, swelling in lower extremities, anasarca, dizziness, palpitations, syncope.   GI  No heartburn, indigestion, abdominal pain, nausea, vomiting, diarrhea, change in bowel habits, loss of appetite, bloody stools.   Resp: No shortness of breath with exertion or at rest.  No excess mucus, no productive cough,  No non-productive cough,  No coughing up of blood.  No change in color of mucus.  No wheezing.  No chest wall  deformity  Skin: no rash or lesions.  GU: no dysuria, change in color of urine, no urgency or frequency.  No flank pain, no hematuria   MS:  No joint pain or swelling.  No decreased range of motion.  No back pain.  Psych:  No change in mood or affect. No depression or anxiety.  No memory loss.   Vital Signs There were no vitals taken for this visit.   Physical Exam:  General- No  distress,  A&Ox3 ENT: No sinus tenderness, TM clear, pale nasal mucosa, no oral exudate,no post nasal drip, no LAN Cardiac: S1, S2, regular rate and rhythm, no murmur Chest: No wheeze/ rales/ dullness; no accessory muscle use, no nasal flaring, no sternal retractions Abd.: Soft Non-tender Ext: No clubbing cyanosis, edema Neuro:  normal strength Skin: No rashes, warm and dry Psych: normal mood and behavior   Assessment/Plan  No problem-specific Assessment & Plan notes found for this encounter.    Magdalen Spatz, NP 02/19/2017  8:57 AM

## 2017-02-25 ENCOUNTER — Encounter: Payer: Self-pay | Admitting: Pulmonary Disease

## 2017-02-25 ENCOUNTER — Ambulatory Visit (INDEPENDENT_AMBULATORY_CARE_PROVIDER_SITE_OTHER): Payer: 59 | Admitting: Pulmonary Disease

## 2017-02-25 VITALS — BP 114/74 | HR 81 | Ht 61.0 in | Wt 121.0 lb

## 2017-02-25 DIAGNOSIS — J4540 Moderate persistent asthma, uncomplicated: Secondary | ICD-10-CM | POA: Diagnosis not present

## 2017-02-25 DIAGNOSIS — J4521 Mild intermittent asthma with (acute) exacerbation: Secondary | ICD-10-CM | POA: Diagnosis not present

## 2017-02-25 NOTE — Assessment & Plan Note (Signed)
We discussed a plan should she have another attack of asthmatic bronchitis.  Prescription for Z-Pak and prednisone Dosepak  -she can take with her for international travel  Call me if no better after starting prednisone for asthmatic bronchitis

## 2017-02-25 NOTE — Progress Notes (Signed)
   Subjective:    Patient ID: [name], female    DOB: Jun 08, 1945, 72 y.o.   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 She has history of onset in her 31s, last seen in the office 04/2015.  she is to follow with Dr. [name], reports allergy to albuterol.  Last seen 04/2015 for a sick visit.  She has been maintained on Flovent discus, used to be on Qvar prior.  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 She got sick about 2 weeks ago ,started with sinus congestion and her granddaughter was, she recently had knee surgery , her son has dogs and she normally takes Zyrtec there is developed into chest cold, she could not bring up any sputum she had wheezing.  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 Allergic rhinitis    seasonal  . Asthma    -FeV1 106% 2006  . Hypertension   . Lower back pain   . Osteopenia   . Sinusitis   . Urinary tract infection, site not specified      Review of Systems neg for any significant sore throat, dysphagia, itching, sneezing, nasal congestion or excess/ purulent secretions, fever, chills, sweats, unintended wt loss, pleuritic or exertional cp, hempoptysis, orthopnea pnd or change in chronic leg swelling. Also denies presyncope, palpitations, heartburn, abdominal pain, nausea, vomiting, diarrhea or change in bowel or urinary habits, dysuria,hematuria, rash, arthralgias, visual complaints, headache, numbness weakness or  ataxia.      Objective:   Physical Exam  Gen. Pleasant, well-nourished, in no distress ENT - no thrush, no post nasal drip Neck: No JVD, no thyromegaly, no carotid bruits Lungs: no use of accessory muscles, no dullness to percussion, clear without rales or rhonchi  Cardiovascular: Rhythm regular, heart sounds  normal, no murmurs or gallops, no peripheral edema Musculoskeletal: No deformities, no cyanosis or clubbing , limps onleft knee       Assessment & Plan:

## 2017-02-25 NOTE — Assessment & Plan Note (Signed)
stay on Flovent and Zyrtec

## 2017-02-25 NOTE — Patient Instructions (Addendum)
Prescription for Z-Pak and prednisone Dosepak   stay on Flovent and Zyrtec  Call me if no better after starting prednisone for asthmatic bronchitis

## 2017-02-26 ENCOUNTER — Telehealth: Payer: Self-pay | Admitting: Pulmonary Disease

## 2017-02-26 MED ORDER — AZITHROMYCIN 250 MG PO TABS: ORAL_TABLET | ORAL | 0 refills | Status: DC

## 2017-02-26 MED ORDER — METHYLPREDNISOLONE 4 MG PO TBPK: ORAL_TABLET | ORAL | 0 refills | Status: DC

## 2017-02-26 NOTE — Telephone Encounter (Addendum)
Ok Rx;s sent in to [facility]. Nothing further is needed.   Instructions      Return in about 6 months (around 08/25/2017).  Prescription for Z-Pak and prednisone Dosepak   stay on Flovent and Zyrtec  Call me if no better after starting prednisone for asthmatic bronchitis

## 2017-03-10 ENCOUNTER — Other Ambulatory Visit: Payer: 59

## 2017-04-09 ENCOUNTER — Ambulatory Visit
Admission: RE | Admit: 2017-04-09 | Discharge: 2017-04-09 | Disposition: A | Discharge status: Home / Self Care | Payer: 59 | Source: Ambulatory Visit | Attending: Internal Medicine | Admitting: Internal Medicine

## 2017-04-09 ENCOUNTER — Other Ambulatory Visit: Payer: Self-pay | Admitting: Internal Medicine

## 2017-04-09 DIAGNOSIS — I10 Essential (primary) hypertension: Secondary | ICD-10-CM

## 2017-04-09 DIAGNOSIS — N281 Cyst of kidney, acquired: Secondary | ICD-10-CM

## 2017-04-13 ENCOUNTER — Other Ambulatory Visit: Payer: Self-pay | Admitting: Internal Medicine

## 2017-04-13 DIAGNOSIS — N2889 Other specified disorders of kidney and ureter: Secondary | ICD-10-CM

## 2017-04-13 DIAGNOSIS — N281 Cyst of kidney, acquired: Secondary | ICD-10-CM

## 2017-04-15 DIAGNOSIS — M7052 Other bursitis of knee, left knee: Secondary | ICD-10-CM | POA: Insufficient documentation

## 2017-04-22 ENCOUNTER — Ambulatory Visit
Admission: RE | Admit: 2017-04-22 | Discharge: 2017-04-22 | Disposition: A | Discharge status: Home / Self Care | Payer: 59 | Source: Ambulatory Visit | Attending: Internal Medicine | Admitting: Internal Medicine

## 2017-04-22 DIAGNOSIS — N281 Cyst of kidney, acquired: Secondary | ICD-10-CM

## 2017-04-22 DIAGNOSIS — N2889 Other specified disorders of kidney and ureter: Secondary | ICD-10-CM

## 2017-04-22 MED ORDER — GADOBENATE DIMEGLUMINE 529 MG/ML IV SOLN
11.0000 mL | Freq: Once | INTRAVENOUS | Status: AC | PRN
  Administered 2017-04-22: 11 mL via INTRAVENOUS

## 2017-05-06 ENCOUNTER — Telehealth: Payer: Self-pay | Admitting: Internal Medicine

## 2017-05-06 NOTE — Telephone Encounter (Signed)
Patient returned call about MRI / Notes recorded by Kendrick Ranch, RN on 05/06/2017 at 4:27 PM EDT Patient was provided with the results per Dr. Alain Marion. Patient is asking that Dr. Alain Marion give her a call back. Patient states she has a number of questions, and she did not want to discuss them with me. 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 I explained that would send this information to the provider. Patient's telephone number was verified as [phone]. / The above information was given to patient and documented in the result note.  The result note was routed back to the practice.

## 2017-05-06 NOTE — Telephone Encounter (Signed)
Copied from [place] [id number]. Topic: General - Other >> May 06, 2017  8:18 AM Yvette Rack wrote: Reason for CRM: patient calling about MRI results

## 2017-05-06 NOTE — Telephone Encounter (Signed)
Patient called [phone], left VM to return the call to the office about MRI results.

## 2017-05-08 NOTE — Telephone Encounter (Signed)
I called, left a VM Thx

## 2017-05-15 ENCOUNTER — Telehealth: Payer: Self-pay | Admitting: Internal Medicine

## 2017-05-15 NOTE — Telephone Encounter (Signed)
Patient called, left detailed VM to call [facility] and ask them to call [facility] to retrieve the prescription refills for Olmesartan. It was sent on 12/11/16 30 tab/11 refills. Advised to call the office back with questions or concerns.

## 2017-05-15 NOTE — Telephone Encounter (Signed)
Copied from [place] [id number]. Topic: Quick Communication - Rx Refill/Question >> May 15, 2017 11:15 AM Yvette Rack wrote: Medication: olmesartan (BENICAR) 40 MG tablet   Has the patient contacted their pharmacy? yes  Preferred Pharmacy (with phone number or street name): [facility] [address] [place], [place] [address]  [phone]  Agent: Please be advised that RX refills may take up to 3 business days. We ask that you follow-up with your pharmacy.

## 2017-05-26 ENCOUNTER — Other Ambulatory Visit (INDEPENDENT_AMBULATORY_CARE_PROVIDER_SITE_OTHER): Payer: 59

## 2017-05-26 DIAGNOSIS — Z0000 Encounter for general adult medical examination without abnormal findings: Secondary | ICD-10-CM | POA: Diagnosis not present

## 2017-05-26 DIAGNOSIS — R7989 Other specified abnormal findings of blood chemistry: Secondary | ICD-10-CM

## 2017-05-26 LAB — HEPATIC FUNCTION PANEL
ALT: 13 U/L (ref 0–35)
AST: 11 U/L (ref 0–37)
Albumin: 4 g/dL (ref 3.5–5.2)
Alkaline Phosphatase: 37 U/L — ABNORMAL LOW (ref 39–117)
Bilirubin, Direct: 0.1 mg/dL (ref 0.0–0.3)
Total Bilirubin: 0.3 mg/dL (ref 0.2–1.2)
Total Protein: 6.7 g/dL (ref 6.0–8.3)

## 2017-05-26 LAB — URINALYSIS
Bilirubin Urine: NEGATIVE
Hgb urine dipstick: NEGATIVE
Ketones, ur: NEGATIVE
Leukocytes, UA: NEGATIVE
Nitrite: NEGATIVE
Specific Gravity, Urine: 1.01 (ref 1.000–1.030)
Total Protein, Urine: NEGATIVE
Urine Glucose: NEGATIVE
Urobilinogen, UA: 0.2 (ref 0.0–1.0)
pH: 7 (ref 5.0–8.0)

## 2017-05-26 LAB — CBC WITH DIFFERENTIAL/PLATELET
Basophils Absolute: 0.1 10*3/uL (ref 0.0–0.1)
Basophils Relative: 1.1 % (ref 0.0–3.0)
Eosinophils Absolute: 0.2 10*3/uL (ref 0.0–0.7)
Eosinophils Relative: 3.9 % (ref 0.0–5.0)
HCT: 36.7 % (ref 36.0–46.0)
Hemoglobin: 12.6 g/dL (ref 12.0–15.0)
Lymphocytes Relative: 45.1 % (ref 12.0–46.0)
Lymphs Abs: 2.7 10*3/uL (ref 0.7–4.0)
MCHC: 34.4 g/dL (ref 30.0–36.0)
MCV: 93 fl (ref 78.0–100.0)
Monocytes Absolute: 0.6 10*3/uL (ref 0.1–1.0)
Monocytes Relative: 10.1 % (ref 3.0–12.0)
Neutro Abs: 2.3 10*3/uL (ref 1.4–7.7)
Neutrophils Relative %: 39.8 % — ABNORMAL LOW (ref 43.0–77.0)
Platelets: 274 10*3/uL (ref 150.0–400.0)
RBC: 3.95 Mil/uL (ref 3.87–5.11)
RDW: 12.8 % (ref 11.5–15.5)
WBC: 5.9 10*3/uL (ref 4.0–10.5)

## 2017-05-26 LAB — BASIC METABOLIC PANEL
BUN: 23 mg/dL (ref 6–23)
CO2: 25 mEq/L (ref 19–32)
Calcium: 9.3 mg/dL (ref 8.4–10.5)
Chloride: 102 mEq/L (ref 96–112)
Creatinine, Ser: 0.97 mg/dL (ref 0.40–1.20)
GFR: 60.36 mL/min (ref >60.00)
Glucose, Bld: 107 mg/dL — ABNORMAL HIGH (ref 70–99)
Potassium: 4.7 mEq/L (ref 3.5–5.1)
Sodium: 136 mEq/L (ref 135–145)

## 2017-05-26 LAB — LIPID PANEL
Cholesterol: 257 mg/dL — ABNORMAL HIGH (ref 0–200)
HDL: 72.8 mg/dL (ref >39.00)
LDL Cholesterol: 155 mg/dL — ABNORMAL HIGH (ref 0–99)
NonHDL: 183.72
Total CHOL/HDL Ratio: 4
Triglycerides: 145 mg/dL (ref 0.0–149.0)
VLDL: 29 mg/dL (ref 0.0–40.0)

## 2017-05-26 LAB — T4, FREE: Free T4: 0.61 ng/dL (ref 0.60–1.60)

## 2017-05-26 LAB — TSH: TSH: 2.82 u[IU]/mL (ref 0.35–4.50)

## 2017-06-01 ENCOUNTER — Encounter: Payer: Self-pay | Admitting: Internal Medicine

## 2017-06-01 ENCOUNTER — Ambulatory Visit (INDEPENDENT_AMBULATORY_CARE_PROVIDER_SITE_OTHER): Payer: 59 | Admitting: Internal Medicine

## 2017-06-01 VITALS — BP 132/76 | HR 80 | Temp 99.3°F | Ht 61.0 in | Wt 126.0 lb

## 2017-06-01 DIAGNOSIS — Z0000 Encounter for general adult medical examination without abnormal findings: Secondary | ICD-10-CM

## 2017-06-01 DIAGNOSIS — I10 Essential (primary) hypertension: Secondary | ICD-10-CM

## 2017-06-01 DIAGNOSIS — Q613 Polycystic kidney, unspecified: Secondary | ICD-10-CM | POA: Diagnosis not present

## 2017-06-01 DIAGNOSIS — E785 Hyperlipidemia, unspecified: Secondary | ICD-10-CM

## 2017-06-01 DIAGNOSIS — R7989 Other specified abnormal findings of blood chemistry: Secondary | ICD-10-CM

## 2017-06-01 DIAGNOSIS — Z7184 Encounter for health counseling related to travel: Secondary | ICD-10-CM

## 2017-06-01 HISTORY — DX: Polycystic kidney, unspecified: Q61.3

## 2017-06-01 MED ORDER — ROSUVASTATIN CALCIUM 5 MG PO TABS: 5.0000 mg | ORAL_TABLET | Freq: Every day | ORAL | 3 refills | Status: DC

## 2017-06-01 MED ORDER — ONDANSETRON HCL 4 MG PO TABS
4.0000 mg | ORAL_TABLET | Freq: Three times a day (TID) | ORAL | 0 refills | Status: DC | PRN
Start: 2017-06-01 — End: 2019-03-14

## 2017-06-01 MED ORDER — CIPROFLOXACIN HCL 500 MG PO TABS: 500.0000 mg | ORAL_TABLET | Freq: Two times a day (BID) | ORAL | 0 refills | Status: DC

## 2017-06-01 NOTE — Progress Notes (Signed)
Subjective:  Patient ID: [name], female    DOB: October 13, 1945  Age: 72 y.o. MRN: [id number]  CC: No chief complaint on file.   HPI [name] presents for a well exam F/u polycystic kidney disease, HTN and CRI   Outpatient Medications Prior to Visit  Medication Sig Dispense Refill  . aspirin EC 81 MG tablet Take by mouth.    . Calcium Citrate (CITRACAL PO) Take by mouth daily.    . cetirizine (ZYRTEC) 10 MG tablet Take 10 mg by mouth as needed.     Marland Kitchen EPINEPHrine (EPIPEN 2-PAK) 0.3 mg/0.3 mL IJ SOAJ injection As directed 1 Device 3  . estradiol (ESTRACE) 0.1 MG/GM vaginal cream Place 2 g vaginally 3 (three) times a week.     . Fluticasone Furoate (ARNUITY ELLIPTA) 100 MCG/ACT AEPB Inhale 100 mcg into the lungs daily. 30 each 11  . MEGARED OMEGA-3 KRILL OIL 500 MG CAPS Take 1 capsule by mouth every morning. 100 capsule 3  . olmesartan (BENICAR) 40 MG tablet Take 1 tablet (40 mg total) by mouth daily. 30 tablet 11  . Pediatric Multiple Vit-C-FA (MULTIVITAMIN ANIMAL SHAPES, WITH CA/FA,) WITH C & FA CHEW Chew 1 tablet by mouth daily.      . predniSONE (DELTASONE) 10 MG tablet Prednisone 10 mg: take 4 tabs a day x 3 days; then 3 tabs a day x 4 days; then 2 tabs a day x 4 days, then 1 tab a day x 6 days, then stop. Take pc. (Patient taking differently: 10 mg as needed. Prednisone 10 mg: take 4 tabs a day x 3 days; then 3 tabs a day x 4 days; then 2 tabs a day x 4 days, then 1 tab a day x 6 days, then stop. Take pc.) 38 tablet 1  . solifenacin (VESICARE) 5 MG tablet daily as needed. Take 1 tablet by mouth daily.  Ok to increase to 2 if no side effects    . traMADol (ULTRAM) 50 MG tablet Take by mouth as needed.    Marland Kitchen azithromycin (ZITHROMAX) 250 MG tablet Take 2 pills today then one a day for 4 additional days 6 tablet 0  . methylPREDNISolone (MEDROL DOSEPAK) 4 MG TBPK tablet follow package directions 21 tablet 0   No facility-administered medications prior to visit.     ROS Review of  Systems  Constitutional: Negative for activity change, appetite change, chills, fatigue and unexpected weight change.  HENT: Negative for congestion, mouth sores and sinus pressure.   Eyes: Negative for visual disturbance.  Respiratory: Negative for cough and chest tightness.   Gastrointestinal: Negative for abdominal pain and nausea.  Genitourinary: Negative for difficulty urinating, frequency and vaginal pain.  Musculoskeletal: Negative for back pain and gait problem.  Skin: Negative for pallor and rash.  Neurological: Negative for dizziness, tremors, weakness, numbness and headaches.  Psychiatric/Behavioral: Negative for confusion and sleep disturbance. The patient is nervous/anxious.     Objective:  BP 132/76 (BP Location: Left Arm, Patient Position: Sitting, Cuff Size: Normal)   Pulse 80   Temp 99.3 F (37.4 C) (Oral)   Ht 5\' 1"  (1.549 m)   Wt 126 lb (57.2 kg)   SpO2 98%   BMI 23.81 kg/m   BP Readings from Last 3 Encounters:  06/01/17 132/76  02/25/17 114/74  02/13/17 130/82    Wt Readings from Last 3 Encounters:  06/01/17 126 lb (57.2 kg)  02/25/17 121 lb (54.9 kg)  02/13/17 131 lb (59.4 kg)  Physical Exam  Constitutional: She appears well-developed. No distress.  HENT:  Head: Normocephalic.  Right Ear: External ear normal.  Left Ear: External ear normal.  Nose: Nose normal.  Mouth/Throat: Oropharynx is clear and moist.  Eyes: Pupils are equal, round, and reactive to light. Conjunctivae are normal. Right eye exhibits no discharge. Left eye exhibits no discharge.  Neck: Normal range of motion. Neck supple. No JVD present. No tracheal deviation present. No thyromegaly present.  Cardiovascular: Normal rate, regular rhythm and normal heart sounds.  Pulmonary/Chest: No stridor. No respiratory distress. She has no wheezes.  Abdominal: Soft. Bowel sounds are normal. She exhibits no distension and no mass. There is no tenderness. There is no rebound and no guarding.    Musculoskeletal: She exhibits no edema or tenderness.  Lymphadenopathy:    She has no cervical adenopathy.  Neurological: She displays normal reflexes. No cranial nerve deficit. She exhibits normal muscle tone. Coordination normal.  Skin: No rash noted. No erythema.  Psychiatric: She has a normal mood and affect. Her behavior is normal. Judgment and thought content normal.    Lab Results  Component Value Date   WBC 5.9 05/26/2017   HGB 12.6 05/26/2017   HCT 36.7 05/26/2017   PLT 274.0 05/26/2017   GLUCOSE 107 (H) 05/26/2017   CHOL 257 (H) 05/26/2017   TRIG 145.0 05/26/2017   HDL 72.80 05/26/2017   LDLDIRECT 126.2 10/06/2012   LDLCALC 155 (H) 05/26/2017   ALT 13 05/26/2017   AST 11 05/26/2017   NA 136 05/26/2017   K 4.7 05/26/2017   CL 102 05/26/2017   CREATININE 0.97 05/26/2017   BUN 23 05/26/2017   CO2 25 05/26/2017   TSH 2.82 05/26/2017   INR 0.95 07/20/2010   HGBA1C 5.9 05/19/2016    Mr Abdomen W Wo Contrast  Result Date: 04/22/2017 CLINICAL DATA:  Followup abnormal ultrasound. Complex cysts left kidney. Known polycystic kidney disease. EXAM: MRI ABDOMEN WITHOUT AND WITH CONTRAST TECHNIQUE: Multiplanar multisequence MR imaging of the abdomen was performed both before and after the administration of intravenous contrast. CONTRAST:  69mL MULTIHANCE GADOBENATE DIMEGLUMINE 529 MG/ML IV SOLN COMPARISON:  Ultrasound 03/28/201 and MRI abdomen 06/14/2008. FINDINGS: Lower chest: The lung bases are grossly clear. No worrisome pulmonary lesions or pleural or pericardial effusion. Hepatobiliary: No focal hepatic lesions or intrahepatic biliary dilatation. The gallbladder is normal. No common bile duct dilatation. Pancreas:  No mass, inflammation or ductal dilatation. Spleen:  Normal size.  No focal lesions. Adrenals/Urinary Tract:  The adrenal glands are normal in stable. There are innumerable bilateral renal cysts with a switch cheese appearance of the renal parenchyma consistent with  polycystic kidney disease. The number of cysts in the size of some of the cysts have enlarged since the prior MRI from 2010 which is not unexpected. I do not see any worrisome enhancing renal lesions to suggest neoplasm. Many of the cysts are septated. Stomach/Bowel: Visualized portions within the abdomen are unremarkable. Vascular/Lymphatic: No pathologically enlarged lymph nodes identified. No abdominal aortic aneurysm demonstrated. Other:  No ascites or abdominal wall hernia. Musculoskeletal: No significant bony findings. IMPRESSION: 1. Polycystic kidney disease with innumerable bilateral renal cysts. There are new cysts and enlarging cyst since a prior MRI from 2010. 2. No worrisome enhancing renal lesions to suggest renal neoplasm. 3. No acute abdominal findings, mass lesions or adenopathy. Electronically Signed   By: Marijo Sanes M.D.   On: 04/22/2017 12:34    Assessment & Plan:   There are no diagnoses linked  to this encounter. I have discontinued Alexxa L. Remache's azithromycin and methylPREDNISolone. I am also having her maintain her estradiol, cetirizine, multivitamin animal shapes (with Ca/FA), solifenacin, aspirin EC, traMADol, Calcium Citrate (CITRACAL PO), MEGARED OMEGA-3 KRILL OIL, EPINEPHrine, predniSONE, olmesartan, and Fluticasone Furoate.  No orders of the defined types were placed in this encounter.    Follow-up: No follow-ups on file.  Walker Kehr, MD

## 2017-06-01 NOTE — Assessment & Plan Note (Addendum)
Cipro MMR vs titers discussed

## 2017-06-01 NOTE — Assessment & Plan Note (Signed)
BP Readings from Last 3 Encounters:  06/01/17 132/76  02/25/17 114/74  02/13/17 130/82

## 2017-06-01 NOTE — Assessment & Plan Note (Signed)
Will watch labs

## 2017-06-01 NOTE — Assessment & Plan Note (Signed)
Vascepa info Crestor to try

## 2017-06-01 NOTE — Assessment & Plan Note (Signed)
Creat is better Nephrol ref MRI discussed

## 2017-06-01 NOTE — Assessment & Plan Note (Signed)
We discussed age appropriate health related issues, including available/recomended screening tests and vaccinations. We discussed a need for adhering to healthy diet and exercise. Labs were ordered to be later reviewed . All questions were answered.   

## 2017-06-03 ENCOUNTER — Other Ambulatory Visit: Payer: Self-pay

## 2017-06-03 MED ORDER — OLMESARTAN MEDOXOMIL 40 MG PO TABS: 40.0000 mg | ORAL_TABLET | Freq: Every day | ORAL | 11 refills | Status: DC

## 2017-06-15 ENCOUNTER — Encounter: Payer: 59 | Admitting: Internal Medicine

## 2017-06-19 IMAGING — DX DG CHEST 2V
2 series · 2 of 2 positions shown · non-contrast
Comparison: None.

CLINICAL DATA: Cough and wheezing.  Short of breath.

EXAM:
CHEST  2 VIEW

[chest pa]
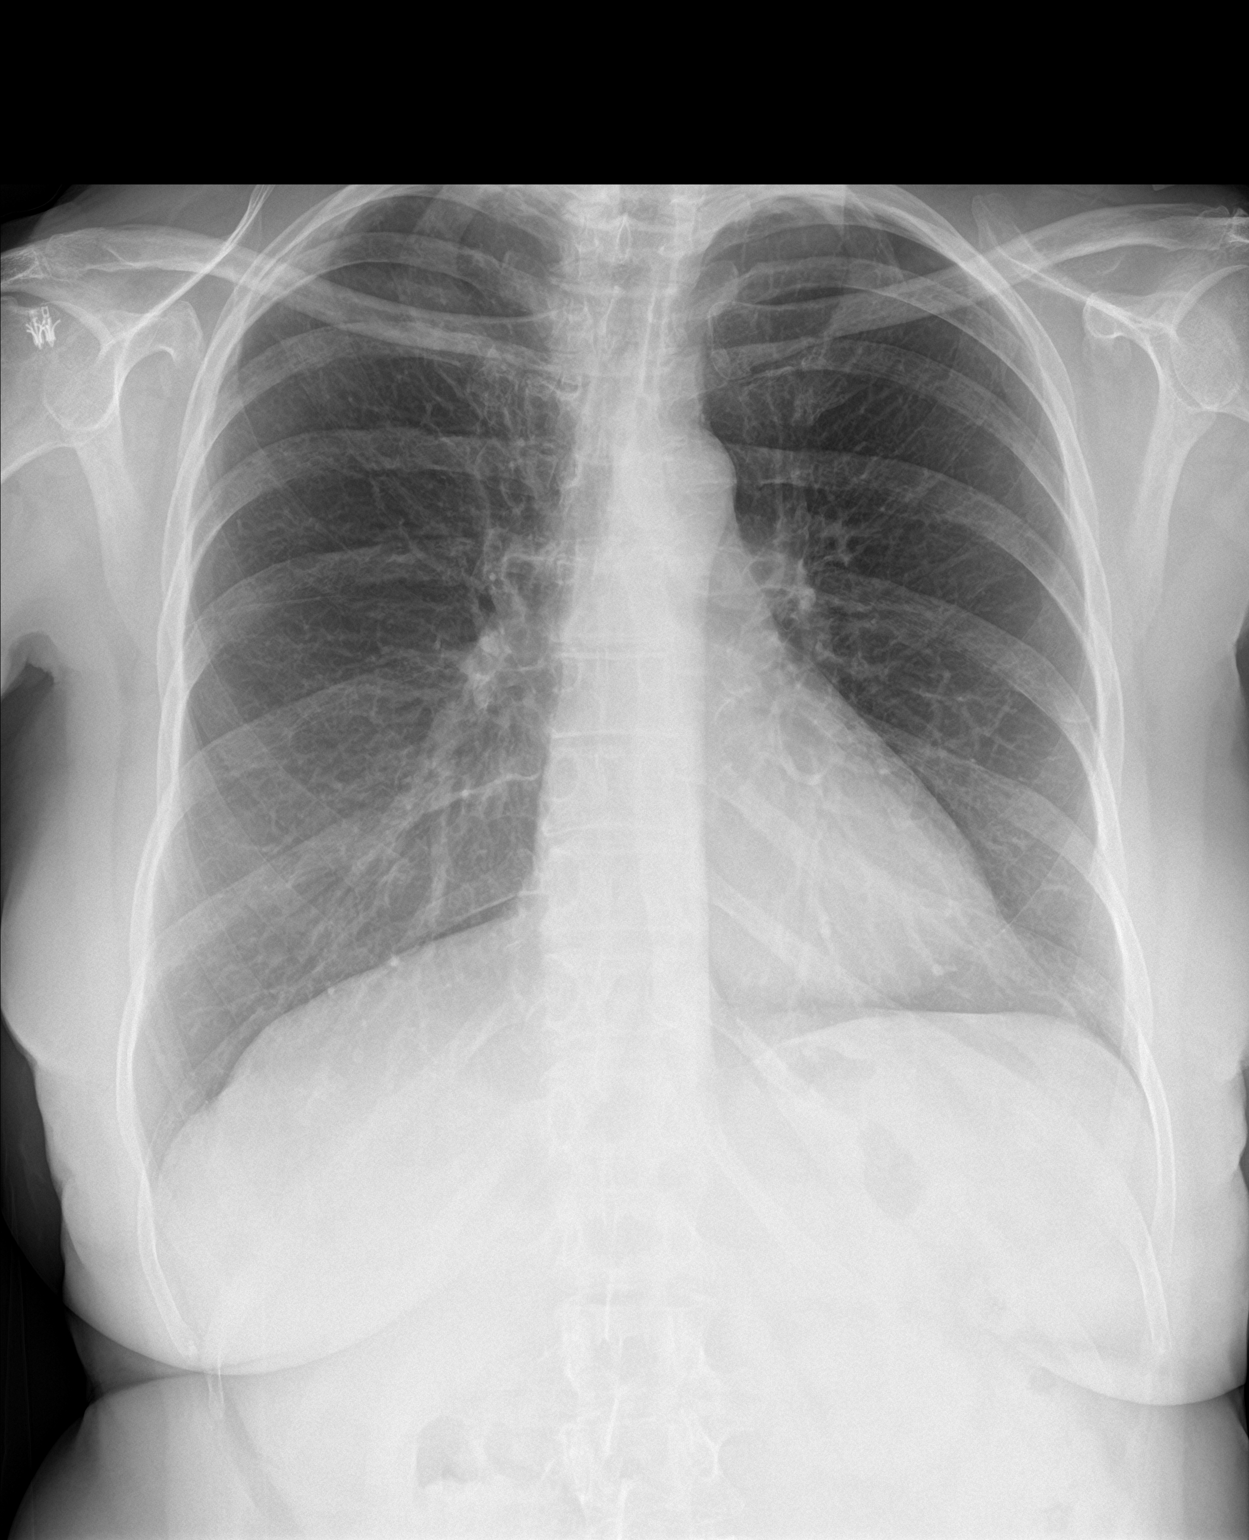

[chest lat]
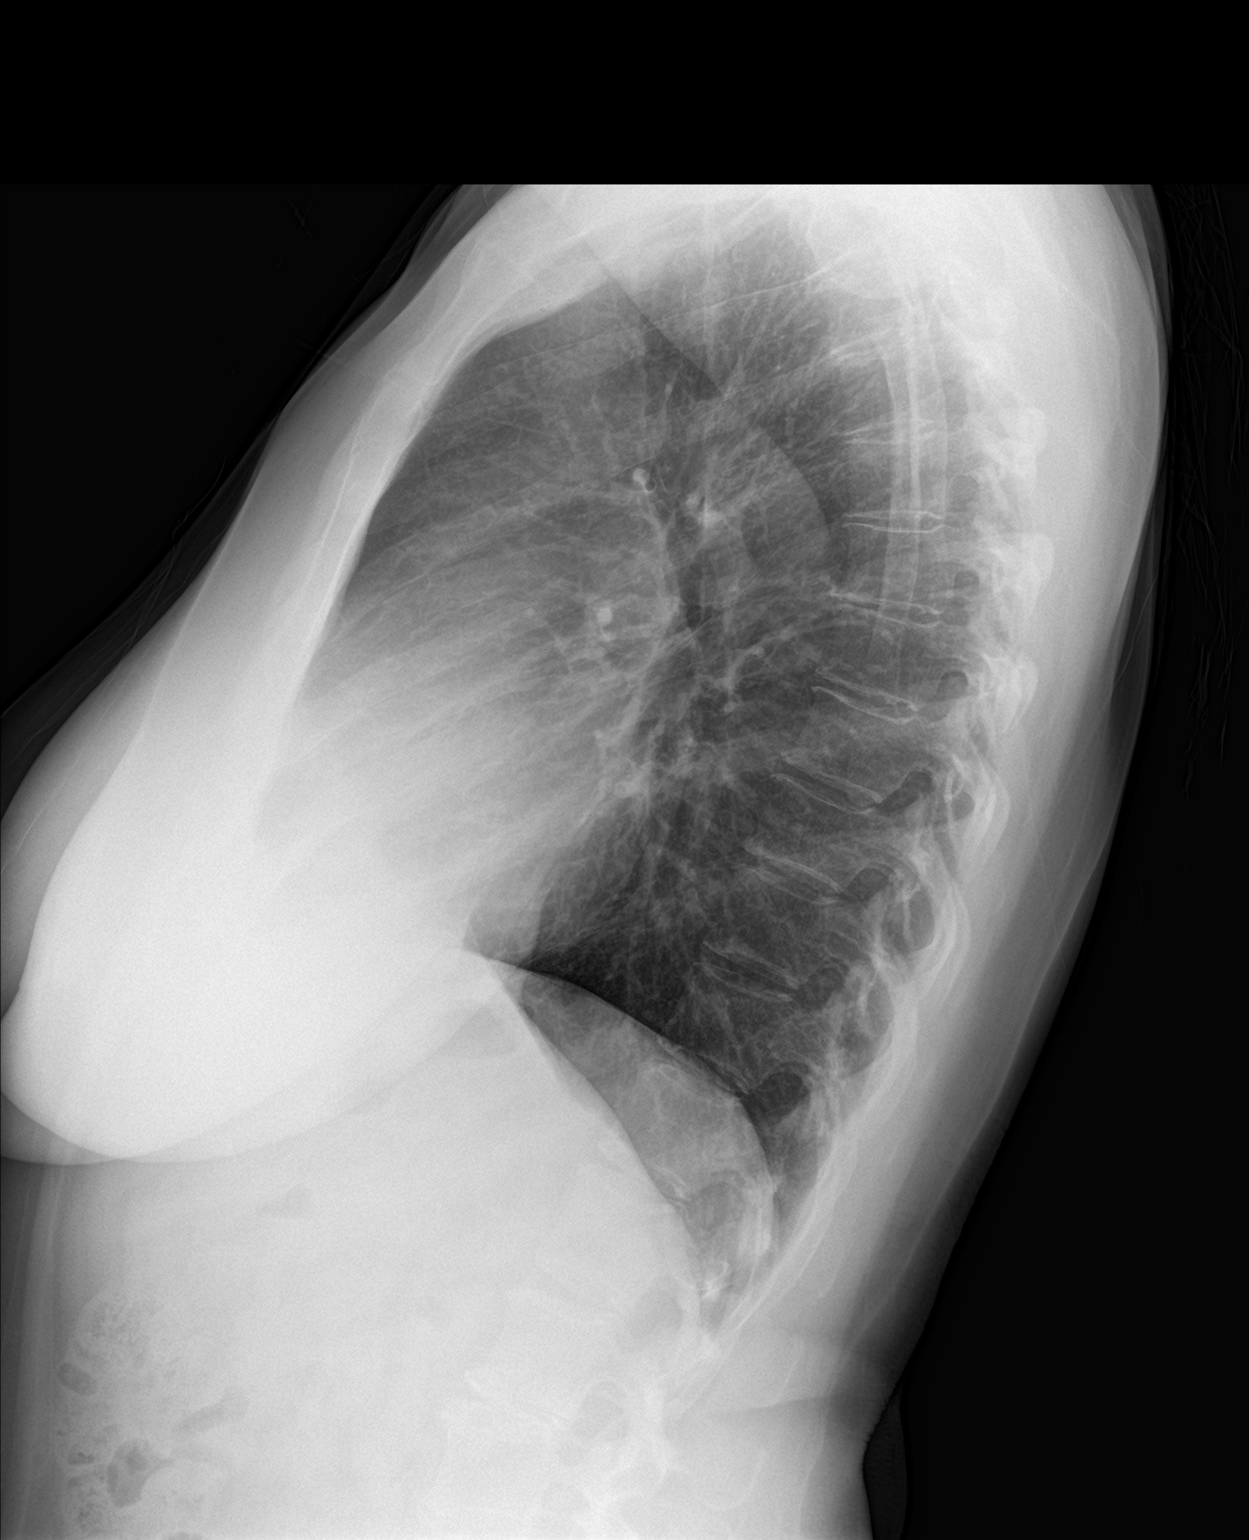

[2 of 2 positions shown; findings below may reference images not displayed]

FINDINGS: The heart size and mediastinal contours are within normal limits.
Both lungs are clear. The visualized skeletal structures are
unremarkable.
IMPRESSION: No active cardiopulmonary disease.

## 2017-07-13 ENCOUNTER — Encounter: Payer: Self-pay | Admitting: Internal Medicine

## 2017-08-26 ENCOUNTER — Other Ambulatory Visit: Payer: Self-pay | Admitting: Internal Medicine

## 2017-08-26 DIAGNOSIS — Q613 Polycystic kidney, unspecified: Secondary | ICD-10-CM

## 2017-10-15 ENCOUNTER — Other Ambulatory Visit: Payer: Self-pay | Admitting: Internal Medicine

## 2017-10-16 ENCOUNTER — Ambulatory Visit: Payer: 59

## 2017-10-23 ENCOUNTER — Ambulatory Visit (INDEPENDENT_AMBULATORY_CARE_PROVIDER_SITE_OTHER): Payer: 59

## 2017-10-23 DIAGNOSIS — Z23 Encounter for immunization: Secondary | ICD-10-CM | POA: Diagnosis not present

## 2017-12-03 ENCOUNTER — Other Ambulatory Visit (INDEPENDENT_AMBULATORY_CARE_PROVIDER_SITE_OTHER): Payer: 59

## 2017-12-03 ENCOUNTER — Encounter: Payer: Self-pay | Admitting: Internal Medicine

## 2017-12-03 ENCOUNTER — Ambulatory Visit (INDEPENDENT_AMBULATORY_CARE_PROVIDER_SITE_OTHER): Payer: 59 | Admitting: Internal Medicine

## 2017-12-03 DIAGNOSIS — I10 Essential (primary) hypertension: Secondary | ICD-10-CM

## 2017-12-03 DIAGNOSIS — I519 Heart disease, unspecified: Secondary | ICD-10-CM

## 2017-12-03 DIAGNOSIS — E785 Hyperlipidemia, unspecified: Secondary | ICD-10-CM

## 2017-12-03 DIAGNOSIS — J4521 Mild intermittent asthma with (acute) exacerbation: Secondary | ICD-10-CM

## 2017-12-03 DIAGNOSIS — R5383 Other fatigue: Secondary | ICD-10-CM

## 2017-12-03 HISTORY — DX: Other fatigue: R53.83

## 2017-12-03 LAB — URINALYSIS
Bilirubin Urine: NEGATIVE
Hgb urine dipstick: NEGATIVE
Ketones, ur: NEGATIVE
Leukocytes, UA: NEGATIVE
Nitrite: NEGATIVE
Specific Gravity, Urine: 1.01 (ref 1.000–1.030)
Urine Glucose: NEGATIVE
Urobilinogen, UA: 0.2 (ref 0.0–1.0)
pH: 6.5 (ref 5.0–8.0)

## 2017-12-03 LAB — CBC WITH DIFFERENTIAL/PLATELET
Basophils Absolute: 0.1 10*3/uL (ref 0.0–0.1)
Basophils Relative: 0.9 % (ref 0.0–3.0)
Eosinophils Absolute: 0.2 10*3/uL (ref 0.0–0.7)
Eosinophils Relative: 3.5 % (ref 0.0–5.0)
HCT: 34.6 % — ABNORMAL LOW (ref 36.0–46.0)
Hemoglobin: 12 g/dL (ref 12.0–15.0)
Lymphocytes Relative: 40.2 % (ref 12.0–46.0)
Lymphs Abs: 2.6 10*3/uL (ref 0.7–4.0)
MCHC: 34.7 g/dL (ref 30.0–36.0)
MCV: 90.4 fl (ref 78.0–100.0)
Monocytes Absolute: 0.8 10*3/uL (ref 0.1–1.0)
Monocytes Relative: 11.9 % (ref 3.0–12.0)
Neutro Abs: 2.8 10*3/uL (ref 1.4–7.7)
Neutrophils Relative %: 43.5 % (ref 43.0–77.0)
Platelets: 283 10*3/uL (ref 150.0–400.0)
RBC: 3.83 Mil/uL — ABNORMAL LOW (ref 3.87–5.11)
RDW: 13.3 % (ref 11.5–15.5)
WBC: 6.5 10*3/uL (ref 4.0–10.5)

## 2017-12-03 LAB — HEPATIC FUNCTION PANEL
ALT: 14 U/L (ref 0–35)
AST: 14 U/L (ref 0–37)
Albumin: 4.3 g/dL (ref 3.5–5.2)
Alkaline Phosphatase: 48 U/L (ref 39–117)
Bilirubin, Direct: 0 mg/dL (ref 0.0–0.3)
Total Bilirubin: 0.4 mg/dL (ref 0.2–1.2)
Total Protein: 7.1 g/dL (ref 6.0–8.3)

## 2017-12-03 LAB — BASIC METABOLIC PANEL
BUN: 18 mg/dL (ref 6–23)
CO2: 28 mEq/L (ref 19–32)
Calcium: 9.8 mg/dL (ref 8.4–10.5)
Chloride: 97 mEq/L (ref 96–112)
Creatinine, Ser: 1.18 mg/dL (ref 0.40–1.20)
GFR: 48.07 mL/min — ABNORMAL LOW (ref >60.00)
Glucose, Bld: 131 mg/dL — ABNORMAL HIGH (ref 70–99)
Potassium: 4.3 mEq/L (ref 3.5–5.1)
Sodium: 133 mEq/L — ABNORMAL LOW (ref 135–145)

## 2017-12-03 LAB — TSH: TSH: 3.36 u[IU]/mL (ref 0.35–4.50)

## 2017-12-03 MED ORDER — NEBIVOLOL HCL 5 MG PO TABS: 5.0000 mg | ORAL_TABLET | Freq: Every day | ORAL | 11 refills | Status: DC

## 2017-12-03 NOTE — Assessment & Plan Note (Signed)
Benicar, Bystolic

## 2017-12-03 NOTE — Patient Instructions (Signed)

## 2017-12-03 NOTE — Assessment & Plan Note (Signed)
Cont Benicar Add Bystolic. SBP at home 140-150

## 2017-12-03 NOTE — Progress Notes (Signed)
Subjective:  Patient ID: [name], female    DOB: [birth year]  Age: 72 y.o. MRN: [id number]  CC: No chief complaint on file.   HPI [name] presents for HTN, CRF, asthma C/o fatigue. Vit B12 was ok w/GYN  Outpatient Medications Prior to Visit  Medication Sig Dispense Refill  . ARNUITY ELLIPTA 100 MCG/ACT AEPB INHALE 100 MCG INTO THE LUNGS EVERY DAY. 30 each 11  . aspirin EC 81 MG tablet Take by mouth.    . Calcium Citrate (CITRACAL PO) Take by mouth daily.    . cetirizine (ZYRTEC) 10 MG tablet Take 10 mg by mouth as needed.     Marland Kitchen EPINEPHrine (EPIPEN 2-PAK) 0.3 mg/0.3 mL IJ SOAJ injection As directed 1 Device 3  . estradiol (ESTRACE) 0.1 MG/GM vaginal cream Place 2 g vaginally 3 (three) times a week.     . olmesartan (BENICAR) 40 MG tablet TAKE 1 TABLET BY MOUTH EVERY DAY 30 tablet 11  . ondansetron (ZOFRAN) 4 MG tablet Take 1 tablet (4 mg total) by mouth every 8 (eight) hours as needed for nausea or vomiting. 20 tablet 0  . Pediatric Multiple Vit-C-FA (MULTIVITAMIN ANIMAL SHAPES, WITH CA/FA,) WITH C & FA CHEW Chew 1 tablet by mouth daily.      . traMADol (ULTRAM) 50 MG tablet Take by mouth as needed.    . ciprofloxacin (CIPRO) 500 MG tablet Take 1 tablet (500 mg total) by mouth 2 (two) times daily. 20 tablet 0  . MEGARED OMEGA-3 KRILL OIL 500 MG CAPS Take 1 capsule by mouth every morning. 100 capsule 3  . rosuvastatin (CRESTOR) 5 MG tablet Take 1 tablet (5 mg total) by mouth daily. 90 tablet 3  . solifenacin (VESICARE) 5 MG tablet daily as needed. Take 1 tablet by mouth daily.  Ok to increase to 2 if no side effects     No facility-administered medications prior to visit.     ROS: Review of Systems  Objective:  BP 134/66 (BP Location: Left Arm, Patient Position: Sitting, Cuff Size: Normal)   Pulse 89   Temp 98.4 F (36.9 C) (Oral)   Ht 5\' 1"  (1.549 m)   Wt 129 lb (58.5 kg)   SpO2 97%   BMI 24.37 kg/m   BP Readings from Last 3 Encounters:  12/03/17 134/66    06/01/17 132/76  02/25/17 114/74    Wt Readings from Last 3 Encounters:  12/03/17 129 lb (58.5 kg)  06/01/17 126 lb (57.2 kg)  02/25/17 121 lb (54.9 kg)    Physical Exam  Lab Results  Component Value Date   WBC 5.9 05/26/2017   HGB 12.6 05/26/2017   HCT 36.7 05/26/2017   PLT 274.0 05/26/2017   GLUCOSE 107 (H) 05/26/2017   CHOL 257 (H) 05/26/2017   TRIG 145.0 05/26/2017   HDL 72.80 05/26/2017   LDLDIRECT 126.2 10/06/2012   LDLCALC 155 (H) 05/26/2017   ALT 13 05/26/2017   AST 11 05/26/2017   NA 136 05/26/2017   K 4.7 05/26/2017   CL 102 05/26/2017   CREATININE 0.97 05/26/2017   BUN 23 05/26/2017   CO2 25 05/26/2017   TSH 2.82 05/26/2017   INR 0.95 07/20/2010   HGBA1C 5.9 05/19/2016    Mr Abdomen W Wo Contrast  Result Date: 04/22/2017 CLINICAL DATA:  Followup abnormal ultrasound. Complex cysts left kidney. Known polycystic kidney disease. EXAM: MRI ABDOMEN WITHOUT AND WITH CONTRAST TECHNIQUE: Multiplanar multisequence MR imaging of the abdomen was performed both before  and after the administration of intravenous contrast. CONTRAST:  67mL MULTIHANCE GADOBENATE DIMEGLUMINE 529 MG/ML IV SOLN COMPARISON:  Ultrasound 03/28/201 and MRI abdomen 06/14/2008. FINDINGS: Lower chest: The lung bases are grossly clear. No worrisome pulmonary lesions or pleural or pericardial effusion. Hepatobiliary: No focal hepatic lesions or intrahepatic biliary dilatation. The gallbladder is normal. No common bile duct dilatation. Pancreas:  No mass, inflammation or ductal dilatation. Spleen:  Normal size.  No focal lesions. Adrenals/Urinary Tract:  The adrenal glands are normal in stable. There are innumerable bilateral renal cysts with a switch cheese appearance of the renal parenchyma consistent with polycystic kidney disease. The number of cysts in the size of some of the cysts have enlarged since the prior MRI from 2010 which is not unexpected. I do not see any worrisome enhancing renal lesions to  suggest neoplasm. Many of the cysts are septated. Stomach/Bowel: Visualized portions within the abdomen are unremarkable. Vascular/Lymphatic: No pathologically enlarged lymph nodes identified. No abdominal aortic aneurysm demonstrated. Other:  No ascites or abdominal wall hernia. Musculoskeletal: No significant bony findings. IMPRESSION: 1. Polycystic kidney disease with innumerable bilateral renal cysts. There are new cysts and enlarging cyst since a prior MRI from 2010. 2. No worrisome enhancing renal lesions to suggest renal neoplasm. 3. No acute abdominal findings, mass lesions or adenopathy. Electronically Signed   By: Marijo Sanes M.D.   On: 04/22/2017 12:34    Assessment & Plan:   There are no diagnoses linked to this encounter.   No orders of the defined types were placed in this encounter.    Follow-up: No follow-ups on file.  Walker Kehr, MD

## 2017-12-03 NOTE — Assessment & Plan Note (Signed)
Vit B12 was ok w/GYN Labs

## 2017-12-03 NOTE — Assessment & Plan Note (Signed)
Arnuity ellipta qd

## 2017-12-03 NOTE — Assessment & Plan Note (Signed)
CT ca scoring option discussed  

## 2017-12-04 ENCOUNTER — Ambulatory Visit: Payer: 59 | Admitting: Internal Medicine

## 2017-12-04 NOTE — Addendum Note (Signed)
Addended by: Karren Cobble on: 12/04/2017 10:56 AM   Modules accepted: Orders

## 2017-12-07 ENCOUNTER — Other Ambulatory Visit: Payer: Self-pay | Admitting: Internal Medicine

## 2017-12-07 DIAGNOSIS — Q613 Polycystic kidney, unspecified: Secondary | ICD-10-CM

## 2017-12-21 ENCOUNTER — Ambulatory Visit (INDEPENDENT_AMBULATORY_CARE_PROVIDER_SITE_OTHER)
Admission: RE | Admit: 2017-12-21 | Discharge: 2017-12-21 | Disposition: A | Discharge status: Home / Self Care | Payer: Self-pay | Source: Ambulatory Visit | Attending: Internal Medicine | Admitting: Internal Medicine

## 2017-12-21 ENCOUNTER — Other Ambulatory Visit: Payer: 59

## 2017-12-21 DIAGNOSIS — I10 Essential (primary) hypertension: Secondary | ICD-10-CM

## 2018-02-24 ENCOUNTER — Encounter: Payer: Self-pay | Admitting: Internal Medicine

## 2018-02-24 ENCOUNTER — Other Ambulatory Visit (INDEPENDENT_AMBULATORY_CARE_PROVIDER_SITE_OTHER): Payer: 59

## 2018-02-24 ENCOUNTER — Ambulatory Visit (INDEPENDENT_AMBULATORY_CARE_PROVIDER_SITE_OTHER): Payer: 59 | Admitting: Internal Medicine

## 2018-02-24 ENCOUNTER — Ambulatory Visit: Payer: Self-pay | Admitting: *Deleted

## 2018-02-24 VITALS — BP 164/72 | HR 73 | Temp 98.0°F | Resp 16 | Ht 61.0 in | Wt 130.4 lb

## 2018-02-24 DIAGNOSIS — I10 Essential (primary) hypertension: Secondary | ICD-10-CM | POA: Diagnosis not present

## 2018-02-24 LAB — COMPREHENSIVE METABOLIC PANEL
ALT: 13 U/L (ref 0–35)
AST: 15 U/L (ref 0–37)
Albumin: 4.2 g/dL (ref 3.5–5.2)
Alkaline Phosphatase: 51 U/L (ref 39–117)
BUN: 23 mg/dL (ref 6–23)
CO2: 29 mEq/L (ref 19–32)
Calcium: 9.3 mg/dL (ref 8.4–10.5)
Chloride: 101 mEq/L (ref 96–112)
Creatinine, Ser: 0.99 mg/dL (ref 0.40–1.20)
GFR: 55.35 mL/min — ABNORMAL LOW (ref >60.00)
Glucose, Bld: 131 mg/dL — ABNORMAL HIGH (ref 70–99)
Potassium: 4.2 mEq/L (ref 3.5–5.1)
Sodium: 137 mEq/L (ref 135–145)
Total Bilirubin: 0.3 mg/dL (ref 0.2–1.2)
Total Protein: 6.8 g/dL (ref 6.0–8.3)

## 2018-02-24 MED ORDER — NEBIVOLOL HCL 2.5 MG PO TABS: 2.5000 mg | ORAL_TABLET | Freq: Every day | ORAL | 5 refills | Status: DC

## 2018-02-24 NOTE — Telephone Encounter (Signed)
Message from [place] sent at 02/24/2018 9:13 AM EST   Summary: BP concern- Headache   Patient called with complaints of her blood pressure being higher than normal for her (157/72) with a headache/ feeling like pressure in head. Please advise.          Called patient back regarding her b/p reading and having a pressure in her head. Her first b/p was 157/72 and the next one was 140/76. She feels like she may need medication readjustment. she is trying to do the all the right things, drinking decaf coffee and have not missed any of her b/p medications. Now she is c/o some lightheadedness and thinks she may be a little nauseated. Advised patient that if she starts having increased in symptoms she needs to be seen in the ED. Symptoms to include: blurred vision, shortness of breath, weakness, chest pain or increase in headache or nausea and vomiting. Pt voiced understanding. Appointment scheduled for today. [facility] at [facility] notified of appointment.    Reason for Disposition . [4] Systolic BP  >= 709 OR Diastolic >= 80 AND [6] taking BP medications  Answer Assessment - Initial Assessment Questions 1. BLOOD PRESSURE: "What is the blood pressure?" "Did you take at least two measurements 5 minutes apart?"     157/72  And  140/76 2. ONSET: "When did you take your blood pressure?"     This morning 3. HOW: "How did you obtain the blood pressure?" (e.g., visiting nurse, automatic home BP monitor)     Automatic home BP monitor 4. HISTORY: "Do you have a history of high blood pressure?"     yes 5. MEDICATIONS: "Are you taking any medications for blood pressure?" "Have you missed any doses recently?"     Have taken her medications and have not missed any doses 6. OTHER SYMPTOMS: "Do you have any symptoms?" (e.g., headache, chest pain, blurred vision, difficulty breathing, weakness)     Headache, lightheaded  7. PREGNANCY: "Is there any chance you are pregnant?" "When was your last  menstrual period?"     N/A  Protocols used: HIGH BLOOD PRESSURE-A-AH

## 2018-02-24 NOTE — Assessment & Plan Note (Signed)
Blood pressure elevated for the past several days.  She is having no associated headache and mild nausea She is anxious about the elevated blood pressure and headache-concerned about a possible brain aneurysm She is taking olmesartan and her blood pressure improved with lifestyle changes and has not been taking the Bystolic Advised starting Bystolic 2.5 mg daily and monitoring blood pressure CMP today to evaluate kidney function Tylenol as needed for headache If headache is not improving blood pressure does not improve over the next few days she can call Follow-up with PCP in 1 week

## 2018-02-24 NOTE — Patient Instructions (Addendum)
  Tests ordered today. Your results will be released to [place] (or called to you) after review, usually within 72hours after test completion. If any changes need to be made, you will be notified at that same time.   Medications reviewed and updated.  Changes include :   Start bystolic 2.5 mg daily  Your prescription(s) have been submitted to your pharmacy. Please take as directed and contact our office if you believe you are having problem(s) with the medication(s).    Please followup with Dr Alain Marion in one week

## 2018-02-24 NOTE — Telephone Encounter (Signed)
Seeing you at 2:30.

## 2018-02-24 NOTE — Progress Notes (Signed)
Subjective:    Patient ID: [name], female    DOB: 03-Dec-1945, 73 y.o.   MRN: [id number]  HPI The patient is here for an acute visit.   Hypertension: She is taking her medication daily. She is compliant with a low sodium diet.  She does exercise.  There has been no change in diet or medications.  Her BP has generally been well controlled.  On 2/2 her BP was well controlled.  3 days ago she had a headache that persisted till the next day and she started checking her blood pressure more frequently and it has been elevated.  It was  157/80, 131/69.  This morning it was 155/72.  Today she had a headache and has had some mild nausea.      She is polycystic kidney disease and does follow with nephrology.  She was concerned the elevated blood pressure may be related to polycystic kidney disease.  She was also concerned that she may have an aneurysm that was the cause of her headaches because she knows there is a slight increased risk of having an aneurysm when you have polycystic kidney disease.  She is concerned why her blood pressure is not controlled.  She has been taking her medication regularly-she takes only the olmesartan.  She has Bystolic on her medication list, but has not taken it.  She did stop drinking caffeine and is working on lifestyle to avoid more medication.  Her blood pressure before this month ago it was well controlled with just the olmesartan.   Medications and allergies reviewed with patient and updated if appropriate.  Patient Active Problem List   Diagnosis Date Noted  . Fatigue 12/03/2017  . Polycystic kidney disease 06/01/2017  . Abnormal TSH 04/09/2016  . Asthmatic bronchitis 04/24/2015  . Diarrhea 05/01/2014  . Shellfish allergy 11/09/2013  . Well adult exam 08/27/2012  . Hyperglycemia 08/27/2012  . Plantar fasciitis of right foot 06/22/2012  . Nausea alone 01/27/2012  . Travel advice encounter 04/21/2011  . Hyponatremia 02/25/2011  . TIA (transient  ischemic attack) 07/30/2010  . Hypertension 07/30/2010  . 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 Dyslipidemia 07/30/2010  . OTHER ACUTE SINUSITIS 08/23/2009  . BURN OF UNSPECIFIED DEGREE OF FOREARM 07/28/2008  . RENAL CYST 05/15/2008  . ACUTE CYSTITIS 03/27/2008  . ELEVATED BP 03/27/2008  . CHEST WALL PAIN, ANTERIOR 09/22/2007  . SINUSITIS 12/15/2006  . Allergic rhinitis 12/15/2006  . Asthma 12/15/2006  . OSTEOPENIA 12/15/2006    Current Outpatient Medications on File Prior to Visit  Medication Sig Dispense Refill  . ARNUITY ELLIPTA 100 MCG/ACT AEPB INHALE 100 MCG INTO THE LUNGS EVERY DAY. 30 each 11  . aspirin EC 81 MG tablet Take by mouth.    . Calcium Citrate (CITRACAL PO) Take by mouth daily.    . cetirizine (ZYRTEC) 10 MG tablet Take 10 mg by mouth as needed.     Marland Kitchen EPINEPHrine (EPIPEN 2-PAK) 0.3 mg/0.3 mL IJ SOAJ injection As directed 1 Device 3  . estradiol (ESTRACE) 0.1 MG/GM vaginal cream Place 2 g vaginally 3 (three) times a week.     . nebivolol (BYSTOLIC) 5 MG tablet Take 1 tablet (5 mg total) by mouth daily. 30 tablet 11  . olmesartan (BENICAR) 40 MG tablet TAKE 1 TABLET BY MOUTH EVERY DAY 30 tablet 11  . ondansetron (ZOFRAN) 4 MG tablet Take 1 tablet (4 mg total) by mouth every 8 (eight) hours as needed for nausea or vomiting. [place]  tablet 0  . Pediatric Multiple Vit-C-FA (MULTIVITAMIN ANIMAL SHAPES, WITH CA/FA,) WITH C & FA CHEW Chew 1 tablet by mouth daily.      . traMADol (ULTRAM) 50 MG tablet Take by mouth as needed.     No current facility-administered medications on file prior to visit.     Past Medical History:  Diagnosis Date  . Allergic rhinitis    seasonal  . Asthma    -FeV1 106% 2006  . Hypertension   . Lower back pain   . Osteopenia   . Sinusitis   . Urinary tract infection, site not specified     Past Surgical History:  Procedure Laterality Date  . BUNIONECTOMY    . CATARACT EXTRACTION, BILATERAL Bilateral 09/2015  . KNEE  ARTHROSCOPY     left  . ROTATOR CUFF REPAIR     right    Social History   Socioeconomic History  . Marital status: Single    Spouse name: Not on file  . Number of children: Not on file  . Years of education: Not on file  . Highest education level: Not on file  Occupational History  . Not on file  Social Needs  . Financial resource strain: Not on file  . Food insecurity:    Worry: Not on file    Inability: Not on file  . Transportation needs:    Medical: Not on file    Non-medical: Not on file  Tobacco Use  . Smoking status: Never Smoker  . Smokeless tobacco: Never Used  Substance and Sexual Activity  . Alcohol use: Yes    Alcohol/week: 2.0 standard drinks    Types: 2 Glasses of wine per week  . Drug use: No  . Sexual activity: Yes    Birth control/protection: Other-see comments  Lifestyle  . Physical activity:    Days per week: Not on file    Minutes per session: Not on file  . Stress: Not on file  Relationships  . Social connections:    Talks on phone: Not on file    Gets together: Not on file    Attends religious service: Not on file    Active member of club or organization: Not on file    Attends meetings of clubs or organizations: Not on file    Relationship status: Not on file  Other Topics Concern  . Not on file  Social History Narrative   Regular exercise-yes.    Family History  Problem Relation Age of Onset  . Stroke Mother   . Cancer Mother 57       stomach  . Heart disease Mother   . Parkinsonism Father   . Glaucoma Father     Review of Systems  Constitutional: Negative for fever.  HENT: Negative for congestion, ear pain and sinus pain.   Respiratory: Negative for cough, shortness of breath and wheezing.   Cardiovascular: Negative for chest pain, palpitations and leg swelling.  Gastrointestinal: Positive for nausea (mild at times).  Neurological: Positive for light-headedness (little) and headaches (pressure in head). Negative for  weakness and numbness.       Objective:   Vitals:   02/24/18 1424  BP: (!) 164/72  Pulse: 73  Resp: 16  Temp: 98 F (36.7 C)  SpO2: 99%   BP Readings from Last 3 Encounters:  02/24/18 (!) 164/72  12/03/17 134/66  06/01/17 132/76   Wt Readings from Last 3 Encounters:  02/24/18 130 lb 6.4 oz (59.1 kg)  12/03/17  129 lb (58.5 kg)  06/01/17 126 lb (57.2 kg)   Body mass index is 24.64 kg/m.   Physical Exam    Constitutional: Appears well-developed and well-nourished. No distress.  HENT:  Head: Normocephalic and atraumatic.  Neck: Neck supple. No tracheal deviation present. No thyromegaly present.  No cervical lymphadenopathy Cardiovascular: Normal rate, regular rhythm and normal heart sounds.   2/6 systolic murmur heard. No carotid bruit .  No edema Pulmonary/Chest: Effort normal and breath sounds normal. No respiratory distress. No has no wheezes. No rales.  Skin: Skin is warm and dry. Not diaphoretic.  Psychiatric: Mildly anxious mood and affect. Behavior is normal.       Assessment & Plan:    See Problem List for Assessment and Plan of chronic medical problems.

## 2018-03-03 ENCOUNTER — Encounter: Payer: Self-pay | Admitting: Internal Medicine

## 2018-03-03 ENCOUNTER — Other Ambulatory Visit: Payer: 59

## 2018-03-03 ENCOUNTER — Ambulatory Visit (INDEPENDENT_AMBULATORY_CARE_PROVIDER_SITE_OTHER): Payer: 59 | Admitting: Internal Medicine

## 2018-03-03 DIAGNOSIS — I10 Essential (primary) hypertension: Secondary | ICD-10-CM

## 2018-03-03 DIAGNOSIS — Q613 Polycystic kidney, unspecified: Secondary | ICD-10-CM | POA: Diagnosis not present

## 2018-03-03 DIAGNOSIS — E785 Hyperlipidemia, unspecified: Secondary | ICD-10-CM | POA: Diagnosis not present

## 2018-03-03 MED ORDER — NEBIVOLOL HCL 2.5 MG PO TABS: 2.5000 mg | ORAL_TABLET | Freq: Every day | ORAL | 11 refills | Status: DC

## 2018-03-03 MED ORDER — SPIRONOLACTONE 50 MG PO TABS: 25.0000 mg | ORAL_TABLET | Freq: Every day | ORAL | 11 refills | Status: DC

## 2018-03-03 MED ORDER — OLMESARTAN MEDOXOMIL 40 MG PO TABS: 20.0000 mg | ORAL_TABLET | Freq: Two times a day (BID) | ORAL | 11 refills | Status: DC

## 2018-03-03 MED ORDER — NEBIVOLOL HCL 2.5 MG PO TABS: 2.5000 mg | ORAL_TABLET | Freq: Two times a day (BID) | ORAL | 11 refills | Status: DC

## 2018-03-03 NOTE — Patient Instructions (Signed)
Weighted blanket 

## 2018-03-03 NOTE — Assessment & Plan Note (Signed)
Labile BP

## 2018-03-03 NOTE — Assessment & Plan Note (Addendum)
Pt declined statins Coronary calcium score of 3.3. This was percentile 34th for age

## 2018-03-03 NOTE — Assessment & Plan Note (Signed)
  S/p nephrology consult

## 2018-03-03 NOTE — Progress Notes (Signed)
Subjective:  Patient ID: [name], female    DOB: July 28, 1945  Age: 73 y.o. MRN: [id number]  CC: No chief complaint on file.   HPI [name] presents for labile BP 112-156 C/o fatigue, insomnia F/u CRF    Outpatient Medications Prior to Visit  Medication Sig Dispense Refill  . ARNUITY ELLIPTA 100 MCG/ACT AEPB INHALE 100 MCG INTO THE LUNGS EVERY DAY. 30 each 11  . aspirin EC 81 MG tablet Take by mouth.    . Calcium Citrate (CITRACAL PO) Take by mouth daily.    . cetirizine (ZYRTEC) 10 MG tablet Take 10 mg by mouth as needed.     Marland Kitchen EPINEPHrine (EPIPEN 2-PAK) 0.3 mg/0.3 mL IJ SOAJ injection As directed 1 Device 3  . estradiol (ESTRACE) 0.1 MG/GM vaginal cream Place 2 g vaginally 3 (three) times a week.     . nebivolol (BYSTOLIC) 2.5 MG tablet Take 1 tablet (2.5 mg total) by mouth daily. 30 tablet 5  . olmesartan (BENICAR) 40 MG tablet TAKE 1 TABLET BY MOUTH EVERY DAY 30 tablet 11  . ondansetron (ZOFRAN) 4 MG tablet Take 1 tablet (4 mg total) by mouth every 8 (eight) hours as needed for nausea or vomiting. 20 tablet 0  . Pediatric Multiple Vit-C-FA (MULTIVITAMIN ANIMAL SHAPES, WITH CA/FA,) WITH C & FA CHEW Chew 1 tablet by mouth daily.      . traMADol (ULTRAM) 50 MG tablet Take by mouth as needed.     No facility-administered medications prior to visit.     ROS: Review of Systems  Constitutional: Negative for activity change, appetite change, chills, fatigue and unexpected weight change.  HENT: Negative for congestion, mouth sores and sinus pressure.   Eyes: Negative for visual disturbance.  Respiratory: Negative for cough and chest tightness.   Gastrointestinal: Negative for abdominal pain and nausea.  Genitourinary: Negative for difficulty urinating, frequency and vaginal pain.  Musculoskeletal: Negative for back pain and gait problem.  Skin: Negative for pallor and rash.  Neurological: Negative for dizziness, tremors, weakness, numbness and headaches.    Psychiatric/Behavioral: Negative for confusion, sleep disturbance and suicidal ideas. The patient is nervous/anxious.     Objective:  BP (!) 146/72 (BP Location: Left Arm, Patient Position: Sitting, Cuff Size: Normal)   Pulse (!) 58   Temp 98.2 F (36.8 C) (Oral)   Ht 5\' 1"  (1.549 m)   Wt 130 lb (59 kg)   SpO2 96%   BMI 24.56 kg/m   BP Readings from Last 3 Encounters:  03/03/18 (!) 146/72  02/24/18 (!) 164/72  12/03/17 134/66    Wt Readings from Last 3 Encounters:  03/03/18 130 lb (59 kg)  02/24/18 130 lb 6.4 oz (59.1 kg)  12/03/17 129 lb (58.5 kg)    Physical Exam Constitutional:      General: She is not in acute distress.    Appearance: She is well-developed.  HENT:     Head: Normocephalic.     Right Ear: External ear normal.     Left Ear: External ear normal.     Nose: Nose normal.  Eyes:     General:        Right eye: No discharge.        Left eye: No discharge.     Conjunctiva/sclera: Conjunctivae normal.     Pupils: Pupils are equal, round, and reactive to light.  Neck:     Musculoskeletal: Normal range of motion and neck supple.     Thyroid: No  thyromegaly.     Vascular: No JVD.     Trachea: No tracheal deviation.  Cardiovascular:     Rate and Rhythm: Normal rate and regular rhythm.     Heart sounds: Normal heart sounds.  Pulmonary:     Effort: No respiratory distress.     Breath sounds: No stridor. No wheezing.  Abdominal:     General: Bowel sounds are normal. There is no distension.     Palpations: Abdomen is soft. There is no mass.     Tenderness: There is no abdominal tenderness. There is no guarding or rebound.  Musculoskeletal:        General: No tenderness.  Lymphadenopathy:     Cervical: No cervical adenopathy.  Skin:    Findings: No erythema or rash.  Neurological:     Cranial Nerves: No cranial nerve deficit.     Motor: No abnormal muscle tone.     Coordination: Coordination normal.     Deep Tendon Reflexes: Reflexes normal.   Psychiatric:        Behavior: Behavior normal.        Thought Content: Thought content normal.        Judgment: Judgment normal.     Lab Results  Component Value Date   WBC 6.5 12/03/2017   HGB 12.0 12/03/2017   HCT 34.6 (L) 12/03/2017   PLT 283.0 12/03/2017   GLUCOSE 131 (H) 02/24/2018   CHOL 257 (H) 05/26/2017   TRIG 145.0 05/26/2017   HDL 72.80 05/26/2017   LDLDIRECT 126.2 10/06/2012   LDLCALC 155 (H) 05/26/2017   ALT 13 02/24/2018   AST 15 02/24/2018   NA 137 02/24/2018   K 4.2 02/24/2018   CL 101 02/24/2018   CREATININE 0.99 02/24/2018   BUN 23 02/24/2018   CO2 29 02/24/2018   TSH 3.36 12/03/2017   INR 0.95 07/20/2010   HGBA1C 5.9 05/19/2016    Ct Cardiac Scoring  Addendum Date: 12/21/2017   ADDENDUM REPORT: 12/21/2017 12:24 CLINICAL DATA:  81F with hypertension and hyperlipidemia for risk stratification EXAM: Coronary Calcium Score TECHNIQUE: The patient was scanned on a [employer] scanner. Axial non-contrast 3 mm slices were carried out through the heart. The data set was analyzed on a dedicated work station and scored using the [place]. FINDINGS: Non-cardiac: See separate report from [facility]. Ascending Aorta: Normal in size.  No calcification. Pericardium: Normal Coronary arteries: Right dominant. Calcification noted in the proximal left circumflex. IMPRESSION: Coronary calcium score of 3.3. This was percentile 34th for age and sex matched control. Skeet Latch, MD Electronically Signed   By: Skeet Latch   On: 12/21/2017 12:24   Result Date: 12/21/2017 EXAM: OVER-READ INTERPRETATION  CT CHEST The following report is an over-read performed by radiologist Dr. Rolm Baptise of [facility], [place] on 12/21/2017. This over-read does not include interpretation of cardiac or coronary anatomy or pathology. The coronary calcium score interpretation by the cardiologist is attached. COMPARISON:  None. FINDINGS: Vascular: Essential height crash  that heart is normal size. Visualized aorta is normal caliber. Mediastinum/Nodes: No adenopathy in the lower mediastinum or hila. Small hiatal hernia. Lungs/Pleura: Visualized lungs clear.  No effusions. Upper Abdomen: Imaging into the upper abdomen shows no acute findings. Musculoskeletal: Chest wall soft tissues are unremarkable. No acute bony abnormality. IMPRESSION: No acute or significant extracardiac abnormality. Electronically Signed: By: Rolm Baptise M.D. On: 12/21/2017 11:10    Assessment & Plan:   There are no diagnoses linked to this encounter.  No orders of the defined types were placed in this encounter.    Follow-up: No follow-ups on file.  Walker Kehr, MD

## 2018-03-08 LAB — ALDOSTERONE + RENIN ACTIVITY W/ RATIO
ALDO / PRA Ratio: 27.8 Ratio (ref 0.9–28.9)
Aldosterone: 5 ng/dL
Renin Activity: 0.18 ng/mL/h — ABNORMAL LOW (ref 0.25–5.82)

## 2018-03-13 LAB — METANEPHRINES, URINE, 24 HOUR
Metaneph Total, Ur: 320 mcg/24 h (ref 224–832)
Metanephrines, Ur: 72 mcg/24 h — ABNORMAL LOW (ref 90–315)
Normetanephrine, 24H Ur: 248 mcg/24 h (ref 122–676)
Volume, Urine-VMAUR: 2600 mL

## 2018-03-13 LAB — 5 HIAA, QUANTITATIVE, URINE, 24 HOUR
5-HIAA: 5.7 mg/24 h (ref <=6.0)
Volume, Urine-VMAUR: 2600 mL/24 h

## 2018-04-14 ENCOUNTER — Ambulatory Visit: Payer: 59 | Admitting: Internal Medicine

## 2018-06-02 ENCOUNTER — Ambulatory Visit: Payer: 59 | Admitting: Internal Medicine

## 2018-06-03 ENCOUNTER — Ambulatory Visit (INDEPENDENT_AMBULATORY_CARE_PROVIDER_SITE_OTHER): Payer: 59 | Admitting: Internal Medicine

## 2018-06-03 ENCOUNTER — Ambulatory Visit: Payer: 59 | Admitting: Internal Medicine

## 2018-06-03 ENCOUNTER — Encounter: Payer: Self-pay | Admitting: Internal Medicine

## 2018-06-03 DIAGNOSIS — J4521 Mild intermittent asthma with (acute) exacerbation: Secondary | ICD-10-CM | POA: Diagnosis not present

## 2018-06-03 DIAGNOSIS — J309 Allergic rhinitis, unspecified: Secondary | ICD-10-CM | POA: Diagnosis not present

## 2018-06-03 DIAGNOSIS — I10 Essential (primary) hypertension: Secondary | ICD-10-CM

## 2018-06-03 DIAGNOSIS — Q613 Polycystic kidney, unspecified: Secondary | ICD-10-CM | POA: Diagnosis not present

## 2018-06-03 NOTE — Assessment & Plan Note (Signed)
Seems to be well controlled.  Continue current therapy.  Obtain lab work

## 2018-06-03 NOTE — Assessment & Plan Note (Signed)
Will obtain lab work.  Continue with good blood pressure control

## 2018-06-03 NOTE — Assessment & Plan Note (Signed)
Continue with Arnuity Ellipta daily

## 2018-06-03 NOTE — Progress Notes (Signed)
Virtual Visit via Video Note  I connected with [name] on 06/03/18 at  1:20 PM EDT by a  telemedicine application and verified that I am speaking with the correct person using two identifiers.   I discussed the limitations of evaluation and management by telemedicine and the availability of in person appointments. The patient expressed understanding and agreed to proceed.  History of Present Illness: We need to follow-up on asthma, hypertension, polycystic kidney disease.  There has been some mild low back pain lately present.  Today  blood pressure is 126/63  There has been no runny nose, cough, chest pain, shortness of breath, abdominal pain, diarrhea, constipation, arthralgias, skin rashes.   Observations/Objective: The patient appears to be in no acute distress  Assessment and Plan:  See my Assessment and Plan. Follow Up Instructions:    I discussed the assessment and treatment plan with the patient. The patient was provided an opportunity to ask questions and all were answered. The patient agreed with the plan and demonstrated an understanding of the instructions.   The patient was advised to call back or seek an in-person evaluation if the symptoms worsen or if the condition fails to improve as anticipated.  I provided face-to-face time during this encounter. We were at different locations. 12 min   Walker Kehr, MD

## 2018-06-03 NOTE — Assessment & Plan Note (Signed)
Continue with Zyrtec

## 2018-06-08 ENCOUNTER — Other Ambulatory Visit (INDEPENDENT_AMBULATORY_CARE_PROVIDER_SITE_OTHER): Payer: 59

## 2018-06-08 DIAGNOSIS — I10 Essential (primary) hypertension: Secondary | ICD-10-CM | POA: Diagnosis not present

## 2018-06-08 LAB — URINALYSIS
Bilirubin Urine: NEGATIVE
Hgb urine dipstick: NEGATIVE
Ketones, ur: NEGATIVE
Leukocytes,Ua: NEGATIVE
Nitrite: NEGATIVE
Specific Gravity, Urine: 1.01 (ref 1.000–1.030)
Total Protein, Urine: NEGATIVE
Urine Glucose: NEGATIVE
Urobilinogen, UA: 0.2 (ref 0.0–1.0)
pH: 7 (ref 5.0–8.0)

## 2018-06-08 LAB — BASIC METABOLIC PANEL
BUN: 21 mg/dL (ref 6–23)
CO2: 28 mEq/L (ref 19–32)
Calcium: 9.1 mg/dL (ref 8.4–10.5)
Chloride: 102 mEq/L (ref 96–112)
Creatinine, Ser: 1.04 mg/dL (ref 0.40–1.20)
GFR: 52.25 mL/min — ABNORMAL LOW (ref >60.00)
Glucose, Bld: 95 mg/dL (ref 70–99)
Potassium: 4.2 mEq/L (ref 3.5–5.1)
Sodium: 136 mEq/L (ref 135–145)

## 2018-06-10 DIAGNOSIS — M546 Pain in thoracic spine: Secondary | ICD-10-CM | POA: Insufficient documentation

## 2018-06-10 DIAGNOSIS — M545 Low back pain, unspecified: Secondary | ICD-10-CM | POA: Insufficient documentation

## 2018-06-10 DIAGNOSIS — G8929 Other chronic pain: Secondary | ICD-10-CM | POA: Insufficient documentation

## 2018-06-14 ENCOUNTER — Other Ambulatory Visit: Payer: Self-pay | Admitting: Internal Medicine

## 2018-06-14 DIAGNOSIS — R739 Hyperglycemia, unspecified: Secondary | ICD-10-CM

## 2018-06-14 DIAGNOSIS — E785 Hyperlipidemia, unspecified: Secondary | ICD-10-CM

## 2018-06-17 ENCOUNTER — Other Ambulatory Visit (INDEPENDENT_AMBULATORY_CARE_PROVIDER_SITE_OTHER): Payer: 59

## 2018-06-17 DIAGNOSIS — Q613 Polycystic kidney, unspecified: Secondary | ICD-10-CM | POA: Diagnosis not present

## 2018-06-17 DIAGNOSIS — R739 Hyperglycemia, unspecified: Secondary | ICD-10-CM

## 2018-06-17 DIAGNOSIS — E785 Hyperlipidemia, unspecified: Secondary | ICD-10-CM

## 2018-06-17 LAB — CBC WITH DIFFERENTIAL/PLATELET
Basophils Absolute: 0 10*3/uL (ref 0.0–0.1)
Basophils Relative: 0.5 % (ref 0.0–3.0)
Eosinophils Absolute: 0.2 10*3/uL (ref 0.0–0.7)
Eosinophils Relative: 2 % (ref 0.0–5.0)
HCT: 34.5 % — ABNORMAL LOW (ref 36.0–46.0)
Hemoglobin: 11.9 g/dL — ABNORMAL LOW (ref 12.0–15.0)
Lymphocytes Relative: 27 % (ref 12.0–46.0)
Lymphs Abs: 2.5 10*3/uL (ref 0.7–4.0)
MCHC: 34.5 g/dL (ref 30.0–36.0)
MCV: 92.7 fl (ref 78.0–100.0)
Monocytes Absolute: 0.8 10*3/uL (ref 0.1–1.0)
Monocytes Relative: 8.1 % (ref 3.0–12.0)
Neutro Abs: 5.9 10*3/uL (ref 1.4–7.7)
Neutrophils Relative %: 62.4 % (ref 43.0–77.0)
Platelets: 263 10*3/uL (ref 150.0–400.0)
RBC: 3.72 Mil/uL — ABNORMAL LOW (ref 3.87–5.11)
RDW: 13 % (ref 11.5–15.5)
WBC: 9.4 10*3/uL (ref 4.0–10.5)

## 2018-06-17 LAB — BASIC METABOLIC PANEL
BUN: 28 mg/dL — ABNORMAL HIGH (ref 6–23)
CO2: 23 mEq/L (ref 19–32)
Calcium: 9 mg/dL (ref 8.4–10.5)
Chloride: 102 mEq/L (ref 96–112)
Creatinine, Ser: 1.07 mg/dL (ref 0.40–1.20)
GFR: 50.56 mL/min — ABNORMAL LOW (ref >60.00)
Glucose, Bld: 98 mg/dL (ref 70–99)
Potassium: 4.6 mEq/L (ref 3.5–5.1)
Sodium: 134 mEq/L — ABNORMAL LOW (ref 135–145)

## 2018-06-17 LAB — LIPID PANEL
Cholesterol: 233 mg/dL — ABNORMAL HIGH (ref 0–200)
HDL: 83 mg/dL (ref >39.00)
LDL Cholesterol: 130 mg/dL — ABNORMAL HIGH (ref 0–99)
NonHDL: 150.46
Total CHOL/HDL Ratio: 3
Triglycerides: 103 mg/dL (ref 0.0–149.0)
VLDL: 20.6 mg/dL (ref 0.0–40.0)

## 2018-06-17 LAB — HEMOGLOBIN A1C: Hgb A1c MFr Bld: 5.8 % (ref 4.6–6.5)

## 2018-08-11 DIAGNOSIS — M7062 Trochanteric bursitis, left hip: Secondary | ICD-10-CM | POA: Insufficient documentation

## 2018-08-16 ENCOUNTER — Ambulatory Visit: Payer: 59 | Admitting: Internal Medicine

## 2018-09-01 ENCOUNTER — Ambulatory Visit: Payer: 59 | Admitting: Internal Medicine

## 2018-09-13 ENCOUNTER — Encounter: Payer: Self-pay | Admitting: Internal Medicine

## 2018-09-13 ENCOUNTER — Ambulatory Visit (INDEPENDENT_AMBULATORY_CARE_PROVIDER_SITE_OTHER): Payer: 59 | Admitting: Internal Medicine

## 2018-09-13 ENCOUNTER — Other Ambulatory Visit: Payer: Self-pay

## 2018-09-13 VITALS — BP 130/70 | HR 87 | Temp 99.7°F | Ht 61.0 in | Wt 130.0 lb

## 2018-09-13 DIAGNOSIS — J4521 Mild intermittent asthma with (acute) exacerbation: Secondary | ICD-10-CM

## 2018-09-13 DIAGNOSIS — Z23 Encounter for immunization: Secondary | ICD-10-CM

## 2018-09-13 DIAGNOSIS — E785 Hyperlipidemia, unspecified: Secondary | ICD-10-CM

## 2018-09-13 DIAGNOSIS — R739 Hyperglycemia, unspecified: Secondary | ICD-10-CM | POA: Diagnosis not present

## 2018-09-13 DIAGNOSIS — Z0000 Encounter for general adult medical examination without abnormal findings: Secondary | ICD-10-CM

## 2018-09-13 NOTE — Assessment & Plan Note (Signed)
No problems now Zyrtec prn

## 2018-09-13 NOTE — Assessment & Plan Note (Signed)
mild

## 2018-09-13 NOTE — Addendum Note (Signed)
Addended by: Cresenciano Lick on: 09/13/2018 02:17 PM   Modules accepted: Orders

## 2018-09-13 NOTE — Assessment & Plan Note (Signed)
We discussed age appropriate health related issues, including available/recomended screening tests and vaccinations. We discussed a need for adhering to healthy diet and exercise. Labs were ordered to be later reviewed . All questions were answered.   

## 2018-09-13 NOTE — Assessment & Plan Note (Signed)
Labs

## 2018-09-13 NOTE — Progress Notes (Signed)
Subjective:  Patient ID: [name], female    DOB: 07-Sep-1945  Age: 73 y.o. MRN: [id number]  CC: No chief complaint on file.   HPI [name] presents for a well exam   Outpatient Medications Prior to Visit  Medication Sig Dispense Refill  . ARNUITY ELLIPTA 100 MCG/ACT AEPB INHALE 100 MCG INTO THE LUNGS EVERY DAY. 30 each 11  . aspirin EC 81 MG tablet Take by mouth.    . Calcium Citrate (CITRACAL PO) Take by mouth daily.    . cetirizine (ZYRTEC) 10 MG tablet Take 10 mg by mouth as needed.     Marland Kitchen EPINEPHrine (EPIPEN 2-PAK) 0.3 mg/0.3 mL IJ SOAJ injection As directed 1 Device 3  . estradiol (ESTRACE) 0.1 MG/GM vaginal cream Place 2 g vaginally 3 (three) times a week.     . nebivolol (BYSTOLIC) 2.5 MG tablet Take 1 tablet (2.5 mg total) by mouth daily. 30 tablet 11  . olmesartan (BENICAR) 40 MG tablet Take 0.5 tablets (20 mg total) by mouth 2 (two) times daily. 30 tablet 11  . ondansetron (ZOFRAN) 4 MG tablet Take 1 tablet (4 mg total) by mouth every 8 (eight) hours as needed for nausea or vomiting. 20 tablet 0  . Pediatric Multiple Vit-C-FA (MULTIVITAMIN ANIMAL SHAPES, WITH CA/FA,) WITH C & FA CHEW Chew 1 tablet by mouth daily.      Marland Kitchen spironolactone (ALDACTONE) 50 MG tablet Take 0.5 tablets (25 mg total) by mouth daily. 30 tablet 11  . traMADol (ULTRAM) 50 MG tablet Take by mouth as needed.     No facility-administered medications prior to visit.     ROS: Review of Systems  Constitutional: Negative for activity change, appetite change, chills, fatigue and unexpected weight change.  HENT: Negative for congestion, mouth sores and sinus pressure.   Eyes: Negative for visual disturbance.  Respiratory: Negative for cough and chest tightness.   Gastrointestinal: Negative for abdominal pain and nausea.  Genitourinary: Negative for difficulty urinating, frequency and vaginal pain.  Musculoskeletal: Negative for back pain and gait problem.  Skin: Negative for pallor and rash.   Neurological: Negative for dizziness, tremors, weakness, numbness and headaches.  Psychiatric/Behavioral: Negative for confusion, sleep disturbance and suicidal ideas.    Objective:  BP 130/70 (BP Location: Left Arm, Patient Position: Sitting, Cuff Size: Normal)   Pulse 87   Temp 99.7 F (37.6 C) (Oral)   Ht 5\' 1"  (1.549 m)   Wt 130 lb (59 kg)   SpO2 97%   BMI 24.56 kg/m   BP Readings from Last 3 Encounters:  09/13/18 130/70  03/03/18 (!) 146/72  02/24/18 (!) 164/72    Wt Readings from Last 3 Encounters:  09/13/18 130 lb (59 kg)  03/03/18 130 lb (59 kg)  02/24/18 130 lb 6.4 oz (59.1 kg)    Physical Exam Constitutional:      General: She is not in acute distress.    Appearance: She is well-developed.  HENT:     Head: Normocephalic.     Right Ear: External ear normal.     Left Ear: External ear normal.     Nose: Nose normal.  Eyes:     General:        Right eye: No discharge.        Left eye: No discharge.     Conjunctiva/sclera: Conjunctivae normal.     Pupils: Pupils are equal, round, and reactive to light.  Neck:     Musculoskeletal: Normal range of  motion and neck supple.     Thyroid: No thyromegaly.     Vascular: No JVD.     Trachea: No tracheal deviation.  Cardiovascular:     Rate and Rhythm: Normal rate and regular rhythm.     Heart sounds: Normal heart sounds.  Pulmonary:     Effort: No respiratory distress.     Breath sounds: No stridor. No wheezing.  Abdominal:     General: Bowel sounds are normal. There is no distension.     Palpations: Abdomen is soft. There is no mass.     Tenderness: There is no abdominal tenderness. There is no guarding or rebound.  Musculoskeletal:        General: No tenderness.  Lymphadenopathy:     Cervical: No cervical adenopathy.  Skin:    Findings: No erythema or rash.  Neurological:     Cranial Nerves: No cranial nerve deficit.     Motor: No abnormal muscle tone.     Coordination: Coordination normal.     Deep  Tendon Reflexes: Reflexes normal.  Psychiatric:        Behavior: Behavior normal.        Thought Content: Thought content normal.        Judgment: Judgment normal.     Lab Results  Component Value Date   WBC 9.4 06/17/2018   HGB 11.9 (L) 06/17/2018   HCT 34.5 (L) 06/17/2018   PLT 263.0 06/17/2018   GLUCOSE 98 06/17/2018   CHOL 233 (H) 06/17/2018   TRIG 103.0 06/17/2018   HDL 83.00 06/17/2018   LDLDIRECT 126.2 10/06/2012   LDLCALC 130 (H) 06/17/2018   ALT 13 02/24/2018   AST 15 02/24/2018   NA 134 (L) 06/17/2018   K 4.6 06/17/2018   CL 102 06/17/2018   CREATININE 1.07 06/17/2018   BUN 28 (H) 06/17/2018   CO2 23 06/17/2018   TSH 3.36 12/03/2017   INR 0.95 07/20/2010   HGBA1C 5.8 06/17/2018    Ct Cardiac Scoring  Addendum Date: 12/21/2017   ADDENDUM REPORT: 12/21/2017 12:24 CLINICAL DATA:  86F with hypertension and hyperlipidemia for risk stratification EXAM: Coronary Calcium Score TECHNIQUE: The patient was scanned on a [employer] scanner. Axial non-contrast 3 mm slices were carried out through the heart. The data set was analyzed on a dedicated work station and scored using the [place]. FINDINGS: Non-cardiac: See separate report from [facility]. Ascending Aorta: Normal in size.  No calcification. Pericardium: Normal Coronary arteries: Right dominant. Calcification noted in the proximal left circumflex. IMPRESSION: Coronary calcium score of 3.3. This was percentile 34th for age and sex matched control. Skeet Latch, MD Electronically Signed   By: Skeet Latch   On: 12/21/2017 12:24   Result Date: 12/21/2017 EXAM: OVER-READ INTERPRETATION  CT CHEST The following report is an over-read performed by radiologist Dr. Rolm Baptise of [facility], [place] on 12/21/2017. This over-read does not include interpretation of cardiac or coronary anatomy or pathology. The coronary calcium score interpretation by the cardiologist is attached. COMPARISON:  None.  FINDINGS: Vascular: Essential height crash that heart is normal size. Visualized aorta is normal caliber. Mediastinum/Nodes: No adenopathy in the lower mediastinum or hila. Small hiatal hernia. Lungs/Pleura: Visualized lungs clear.  No effusions. Upper Abdomen: Imaging into the upper abdomen shows no acute findings. Musculoskeletal: Chest wall soft tissues are unremarkable. No acute bony abnormality. IMPRESSION: No acute or significant extracardiac abnormality. Electronically Signed: By: Rolm Baptise M.D. On: 12/21/2017 11:10    Assessment &  Plan:   There are no diagnoses linked to this encounter.   No orders of the defined types were placed in this encounter.    Follow-up: No follow-ups on file.  Walker Kehr, MD

## 2018-10-08 ENCOUNTER — Other Ambulatory Visit: Payer: Self-pay | Admitting: Internal Medicine

## 2018-10-13 ENCOUNTER — Other Ambulatory Visit: Payer: Self-pay | Admitting: Internal Medicine

## 2018-10-13 DIAGNOSIS — R002 Palpitations: Secondary | ICD-10-CM

## 2018-10-13 NOTE — Progress Notes (Signed)
Cardiology

## 2018-10-15 NOTE — Addendum Note (Signed)
Addended by: Karren Cobble on: 10/15/2018 11:23 AM   Modules accepted: Orders

## 2018-10-20 ENCOUNTER — Telehealth: Payer: Self-pay

## 2018-10-20 NOTE — Telephone Encounter (Signed)
lmtcb to update appt time for 10/9

## 2018-10-20 NOTE — Telephone Encounter (Signed)
Left message stating pt appt time to remain as 1:45pm on 10/9 and pt may call back  With any questions or concerns

## 2018-10-22 ENCOUNTER — Ambulatory Visit: Payer: 59 | Admitting: Cardiovascular Disease

## 2019-02-09 DIAGNOSIS — S93509A Unspecified sprain of unspecified toe(s), initial encounter: Secondary | ICD-10-CM | POA: Insufficient documentation

## 2019-02-17 ENCOUNTER — Other Ambulatory Visit: Payer: Self-pay | Admitting: Internal Medicine

## 2019-02-17 MED ORDER — EPINEPHRINE 0.3 MG/0.3ML IJ SOAJ: INTRAMUSCULAR | 3 refills | Status: DC

## 2019-03-02 ENCOUNTER — Other Ambulatory Visit: Payer: Self-pay | Admitting: Internal Medicine

## 2019-03-09 ENCOUNTER — Other Ambulatory Visit (INDEPENDENT_AMBULATORY_CARE_PROVIDER_SITE_OTHER): Payer: 59

## 2019-03-09 ENCOUNTER — Other Ambulatory Visit: Payer: Self-pay

## 2019-03-09 DIAGNOSIS — Z0000 Encounter for general adult medical examination without abnormal findings: Secondary | ICD-10-CM | POA: Diagnosis not present

## 2019-03-09 LAB — CBC WITH DIFFERENTIAL/PLATELET
Basophils Absolute: 0.1 10*3/uL (ref 0.0–0.1)
Basophils Relative: 0.9 % (ref 0.0–3.0)
Eosinophils Absolute: 0.2 10*3/uL (ref 0.0–0.7)
Eosinophils Relative: 3.8 % (ref 0.0–5.0)
HCT: 33.7 % — ABNORMAL LOW (ref 36.0–46.0)
Hemoglobin: 11.3 g/dL — ABNORMAL LOW (ref 12.0–15.0)
Lymphocytes Relative: 46 % (ref 12.0–46.0)
Lymphs Abs: 2.6 10*3/uL (ref 0.7–4.0)
MCHC: 33.7 g/dL (ref 30.0–36.0)
MCV: 92.7 fl (ref 78.0–100.0)
Monocytes Absolute: 0.6 10*3/uL (ref 0.1–1.0)
Monocytes Relative: 10.8 % (ref 3.0–12.0)
Neutro Abs: 2.2 10*3/uL (ref 1.4–7.7)
Neutrophils Relative %: 38.5 % — ABNORMAL LOW (ref 43.0–77.0)
Platelets: 267 10*3/uL (ref 150.0–400.0)
RBC: 3.63 Mil/uL — ABNORMAL LOW (ref 3.87–5.11)
RDW: 12.6 % (ref 11.5–15.5)
WBC: 5.7 10*3/uL (ref 4.0–10.5)

## 2019-03-09 LAB — URINALYSIS
Bilirubin Urine: NEGATIVE
Hgb urine dipstick: NEGATIVE
Ketones, ur: NEGATIVE
Leukocytes,Ua: NEGATIVE
Nitrite: NEGATIVE
Specific Gravity, Urine: 1.02 (ref 1.000–1.030)
Total Protein, Urine: NEGATIVE
Urine Glucose: NEGATIVE
Urobilinogen, UA: 0.2 (ref 0.0–1.0)
pH: 6 (ref 5.0–8.0)

## 2019-03-09 LAB — BASIC METABOLIC PANEL
BUN: 28 mg/dL — ABNORMAL HIGH (ref 6–23)
CO2: 25 mEq/L (ref 19–32)
Calcium: 9.2 mg/dL (ref 8.4–10.5)
Chloride: 105 mEq/L (ref 96–112)
Creatinine, Ser: 1.09 mg/dL (ref 0.40–1.20)
GFR: 49.38 mL/min — ABNORMAL LOW (ref >60.00)
Glucose, Bld: 98 mg/dL (ref 70–99)
Potassium: 4.2 mEq/L (ref 3.5–5.1)
Sodium: 137 mEq/L (ref 135–145)

## 2019-03-09 LAB — HEPATIC FUNCTION PANEL
ALT: 14 U/L (ref 0–35)
AST: 14 U/L (ref 0–37)
Albumin: 4 g/dL (ref 3.5–5.2)
Alkaline Phosphatase: 41 U/L (ref 39–117)
Bilirubin, Direct: 0.1 mg/dL (ref 0.0–0.3)
Total Bilirubin: 0.4 mg/dL (ref 0.2–1.2)
Total Protein: 6.8 g/dL (ref 6.0–8.3)

## 2019-03-09 LAB — LIPID PANEL
Cholesterol: 262 mg/dL — ABNORMAL HIGH (ref 0–200)
HDL: 72.3 mg/dL (ref >39.00)
LDL Cholesterol: 163 mg/dL — ABNORMAL HIGH (ref 0–99)
NonHDL: 189.21
Total CHOL/HDL Ratio: 4
Triglycerides: 133 mg/dL (ref 0.0–149.0)
VLDL: 26.6 mg/dL (ref 0.0–40.0)

## 2019-03-09 LAB — TSH: TSH: 4.97 u[IU]/mL — ABNORMAL HIGH (ref 0.35–4.50)

## 2019-03-14 ENCOUNTER — Other Ambulatory Visit: Payer: Self-pay

## 2019-03-14 ENCOUNTER — Ambulatory Visit (INDEPENDENT_AMBULATORY_CARE_PROVIDER_SITE_OTHER): Payer: 59 | Admitting: Internal Medicine

## 2019-03-14 ENCOUNTER — Encounter: Payer: Self-pay | Admitting: Internal Medicine

## 2019-03-14 DIAGNOSIS — Z91013 Allergy to seafood: Secondary | ICD-10-CM

## 2019-03-14 DIAGNOSIS — E785 Hyperlipidemia, unspecified: Secondary | ICD-10-CM

## 2019-03-14 DIAGNOSIS — R002 Palpitations: Secondary | ICD-10-CM

## 2019-03-14 DIAGNOSIS — Q613 Polycystic kidney, unspecified: Secondary | ICD-10-CM

## 2019-03-14 DIAGNOSIS — I10 Essential (primary) hypertension: Secondary | ICD-10-CM

## 2019-03-14 DIAGNOSIS — R7989 Other specified abnormal findings of blood chemistry: Secondary | ICD-10-CM

## 2019-03-14 DIAGNOSIS — J4521 Mild intermittent asthma with (acute) exacerbation: Secondary | ICD-10-CM

## 2019-03-14 DIAGNOSIS — I519 Heart disease, unspecified: Secondary | ICD-10-CM

## 2019-03-14 DIAGNOSIS — M25562 Pain in left knee: Secondary | ICD-10-CM | POA: Insufficient documentation

## 2019-03-14 DIAGNOSIS — E871 Hypo-osmolality and hyponatremia: Secondary | ICD-10-CM

## 2019-03-14 HISTORY — DX: Pain in left knee: M25.562

## 2019-03-14 HISTORY — DX: Palpitations: R00.2

## 2019-03-14 MED ORDER — OMEGA-3-ACID ETHYL ESTERS 1 G PO CAPS: 2.0000 g | ORAL_CAPSULE | Freq: Two times a day (BID) | ORAL | 11 refills | Status: DC

## 2019-03-14 MED ORDER — OLMESARTAN MEDOXOMIL 40 MG PO TABS: 40.0000 mg | ORAL_TABLET | Freq: Every day | ORAL | 3 refills | Status: DC

## 2019-03-14 MED ORDER — EPINEPHRINE 0.3 MG/0.3ML IJ SOAJ: INTRAMUSCULAR | 3 refills | Status: DC

## 2019-03-14 NOTE — Progress Notes (Signed)
Subjective:  Patient ID: [name], female    DOB: 1945-05-10  Age: 74 y.o. MRN: [id number]  CC: No chief complaint on file.   HPI [name] presents for  S/p L knee injury  Outpatient Medications Prior to Visit  Medication Sig Dispense Refill  . ARNUITY ELLIPTA 100 MCG/ACT AEPB INHALE 1 PUFF BY MOUTH INTO THE LUNGS EVERY DAY 30 each 5  . aspirin EC 81 MG tablet Take by mouth.    . Calcium Citrate (CITRACAL PO) Take by mouth daily.    . cetirizine (ZYRTEC) 10 MG tablet Take 10 mg by mouth as needed.     Marland Kitchen EPINEPHrine (EPIPEN 2-PAK) 0.3 mg/0.3 mL IJ SOAJ injection As directed 1 each 3  . estradiol (ESTRACE) 0.1 MG/GM vaginal cream Place 2 g vaginally 3 (three) times a week.     . nebivolol (BYSTOLIC) 2.5 MG tablet Take 1 tablet (2.5 mg total) by mouth daily. 30 tablet 11  . olmesartan (BENICAR) 40 MG tablet TAKE 1 TABLET BY MOUTH EVERY DAY 30 tablet 11  . ondansetron (ZOFRAN) 4 MG tablet Take 1 tablet (4 mg total) by mouth every 8 (eight) hours as needed for nausea or vomiting. 20 tablet 0  . Pediatric Multiple Vit-C-FA (MULTIVITAMIN ANIMAL SHAPES, WITH CA/FA,) WITH C & FA CHEW Chew 1 tablet by mouth daily.      . traMADol (ULTRAM) 50 MG tablet Take by mouth as needed.    Marland Kitchen spironolactone (ALDACTONE) 50 MG tablet Take 0.5 tablets (25 mg total) by mouth daily. 30 tablet 11   No facility-administered medications prior to visit.    ROS: Review of Systems  Constitutional: Negative for activity change, appetite change, chills, fatigue and unexpected weight change.  HENT: Negative for congestion, mouth sores and sinus pressure.   Eyes: Negative for visual disturbance.  Respiratory: Negative for cough and chest tightness.   Gastrointestinal: Negative for abdominal pain and nausea.  Genitourinary: Negative for difficulty urinating, frequency and vaginal pain.  Musculoskeletal: Positive for arthralgias and gait problem. Negative for back pain.  Skin: Negative for pallor and rash.   Neurological: Negative for dizziness, tremors, weakness, numbness and headaches.  Psychiatric/Behavioral: Negative for confusion, sleep disturbance and suicidal ideas.   L knee in a brace  Objective:  BP 126/68 (BP Location: Left Arm, Patient Position: Sitting, Cuff Size: Normal)   Pulse 73   Temp 98.3 F (36.8 C) (Oral)   Ht 5\' 1"  (1.549 m)   Wt 135 lb (61.2 kg)   SpO2 98%   BMI 25.51 kg/m   BP Readings from Last 3 Encounters:  03/14/19 126/68  09/13/18 130/70  03/03/18 (!) 146/72    Wt Readings from Last 3 Encounters:  03/14/19 135 lb (61.2 kg)  09/13/18 130 lb (59 kg)  03/03/18 130 lb (59 kg)    Physical Exam Constitutional:      General: She is not in acute distress.    Appearance: She is well-developed.  HENT:     Head: Normocephalic.     Right Ear: External ear normal.     Left Ear: External ear normal.     Nose: Nose normal.  Eyes:     General:        Right eye: No discharge.        Left eye: No discharge.     Conjunctiva/sclera: Conjunctivae normal.     Pupils: Pupils are equal, round, and reactive to light.  Neck:     Thyroid: No  thyromegaly.     Vascular: No JVD.     Trachea: No tracheal deviation.  Cardiovascular:     Rate and Rhythm: Normal rate and regular rhythm.     Heart sounds: Normal heart sounds.  Pulmonary:     Effort: No respiratory distress.     Breath sounds: No stridor. No wheezing.  Abdominal:     General: Bowel sounds are normal. There is no distension.     Palpations: Abdomen is soft. There is no mass.     Tenderness: There is no abdominal tenderness. There is no guarding or rebound.  Musculoskeletal:        General: Tenderness present.     Cervical back: Normal range of motion and neck supple.  Lymphadenopathy:     Cervical: No cervical adenopathy.  Skin:    Findings: No erythema or rash.  Neurological:     Mental Status: She is oriented to person, place, and time.     Cranial Nerves: No cranial nerve deficit.     Motor:  No abnormal muscle tone.     Coordination: Coordination normal.     Deep Tendon Reflexes: Reflexes normal.  Psychiatric:        Behavior: Behavior normal.        Thought Content: Thought content normal.        Judgment: Judgment normal.   L knee in a brace  Lab Results  Component Value Date   WBC 5.7 03/09/2019   HGB 11.3 (L) 03/09/2019   HCT 33.7 (L) 03/09/2019   PLT 267.0 03/09/2019   GLUCOSE 98 03/09/2019   CHOL 262 (H) 03/09/2019   TRIG 133.0 03/09/2019   HDL 72.30 03/09/2019   LDLDIRECT 126.2 10/06/2012   LDLCALC 163 (H) 03/09/2019   ALT 14 03/09/2019   AST 14 03/09/2019   NA 137 03/09/2019   K 4.2 03/09/2019   CL 105 03/09/2019   CREATININE 1.09 03/09/2019   BUN 28 (H) 03/09/2019   CO2 25 03/09/2019   TSH 4.97 (H) 03/09/2019   INR 0.95 07/20/2010   HGBA1C 5.8 06/17/2018    CT CARDIAC SCORING  Addendum Date: 12/21/2017   ADDENDUM REPORT: 12/21/2017 12:24 CLINICAL DATA:  25F with hypertension and hyperlipidemia for risk stratification EXAM: Coronary Calcium Score TECHNIQUE: The patient was scanned on a [employer] scanner. Axial non-contrast 3 mm slices were carried out through the heart. The data set was analyzed on a dedicated work station and scored using the [place]. FINDINGS: Non-cardiac: See separate report from [facility]. Ascending Aorta: Normal in size.  No calcification. Pericardium: Normal Coronary arteries: Right dominant. Calcification noted in the proximal left circumflex. IMPRESSION: Coronary calcium score of 3.3. This was percentile 34th for age and sex matched control. Skeet Latch, MD Electronically Signed   By: Skeet Latch   On: 12/21/2017 12:24   Result Date: 12/21/2017 EXAM: OVER-READ INTERPRETATION  CT CHEST The following report is an over-read performed by radiologist Dr. Rolm Baptise of [facility], [place] on 12/21/2017. This over-read does not include interpretation of cardiac or coronary anatomy or pathology. The  coronary calcium score interpretation by the cardiologist is attached. COMPARISON:  None. FINDINGS: Vascular: Essential height crash that heart is normal size. Visualized aorta is normal caliber. Mediastinum/Nodes: No adenopathy in the lower mediastinum or hila. Small hiatal hernia. Lungs/Pleura: Visualized lungs clear.  No effusions. Upper Abdomen: Imaging into the upper abdomen shows no acute findings. Musculoskeletal: Chest wall soft tissues are unremarkable. No acute bony abnormality. IMPRESSION:  No acute or significant extracardiac abnormality. Electronically Signed: By: Rolm Baptise M.D. On: 12/21/2017 11:10    Assessment & Plan:   There are no diagnoses linked to this encounter.   No orders of the defined types were placed in this encounter.    Follow-up: No follow-ups on file.  Walker Kehr, MD

## 2019-03-14 NOTE — Assessment & Plan Note (Signed)
Cont Benicar 

## 2019-03-14 NOTE — Assessment & Plan Note (Signed)
2019 CT ca scoring ---- IMPRESSION: Coronary calcium score of 3.3. This was percentile 34th for age

## 2019-03-14 NOTE — Assessment & Plan Note (Signed)
TSH, FT4 

## 2019-03-14 NOTE — Assessment & Plan Note (Signed)
F/u w/[facility] Left knee MCL sprain x 3 weeks and right distal 1/3 of 1st metatarsal non-displaced hairline fracture.

## 2019-03-14 NOTE — Assessment & Plan Note (Signed)
Arnuity

## 2019-03-14 NOTE — Assessment & Plan Note (Signed)
Epi pen 

## 2019-03-14 NOTE — Assessment & Plan Note (Addendum)
2020 Cardiol ref Dr Harrington Challenger

## 2019-03-14 NOTE — Assessment & Plan Note (Signed)
F/u w/Nephrology 

## 2019-03-14 NOTE — Assessment & Plan Note (Signed)
Monitor Na ?

## 2019-04-07 ENCOUNTER — Ambulatory Visit (INDEPENDENT_AMBULATORY_CARE_PROVIDER_SITE_OTHER): Payer: 59 | Admitting: Internal Medicine

## 2019-04-07 ENCOUNTER — Other Ambulatory Visit: Payer: Self-pay

## 2019-04-07 ENCOUNTER — Encounter: Payer: Self-pay | Admitting: Internal Medicine

## 2019-04-07 VITALS — BP 133/71 | HR 76 | Ht 61.0 in | Wt 129.8 lb

## 2019-04-07 DIAGNOSIS — E785 Hyperlipidemia, unspecified: Secondary | ICD-10-CM | POA: Diagnosis not present

## 2019-04-07 DIAGNOSIS — R002 Palpitations: Secondary | ICD-10-CM | POA: Diagnosis not present

## 2019-04-07 MED ORDER — ROSUVASTATIN CALCIUM 10 MG PO TABS: 10.0000 mg | ORAL_TABLET | Freq: Every day | ORAL | 3 refills | Status: DC

## 2019-04-07 MED ORDER — METOPROLOL SUCCINATE ER 25 MG PO TB24: 12.5000 mg | ORAL_TABLET | Freq: Every day | ORAL | 3 refills | Status: DC | PRN
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
## 2019-04-07 NOTE — Patient Instructions (Addendum)
Medication Instructions:  Your physician has recommended you make the following change in your medication:  1.) start rosuvastatin (Crestor) 10 mg once daily  *If you need a refill on your cardiac medications before your next appointment, please call your pharmacy*   Lab Work: In 8 weeks --lipids/liver function  If you have labs (blood work) drawn today and your tests are completely normal, you will receive your results only by: Marland Kitchen MyChart Message (if you have MyChart) OR . A paper copy in the mail If you have any lab test that is abnormal or we need to change your treatment, we will call you to review the results.   Testing/Procedures: none   Follow-Up: At [facility], you and your health needs are our priority.  As part of our continuing mission to provide you with exceptional heart care, we have created designated Provider Care Teams.  These Care Teams include your primary Cardiologist (physician) and Advanced Practice Providers (APPs -  Physician Assistants and Nurse Practitioners) who all work together to provide you with the care you need, when you need it.  Your next appointment:   12 month(s)  The format for your next appointment:   In Person  Provider:   You may see Dr. [name] or one of the following Advanced Practice Providers on your designated Care Team:    Richardson Dopp, PA-C  [place], [place]  Daune Perch, NP    Other Instructions   Addendum: Toprol XL 25 mg has been sent to pharmcy after reviewing with Dr. Harrington Challenger.  Pt is aware to take take 1/2 tablet daily as needed for palpitations. [place], RN 04/07/19 10:45am

## 2019-04-11 IMAGING — DX DG CHEST 2V
2 series · 2 of 2 positions shown · non-contrast
Comparison: 04/24/2015

CLINICAL DATA: Cough, congestion and shortness of breath for 4
days.

EXAM:
CHEST  2 VIEW

[chest pa]
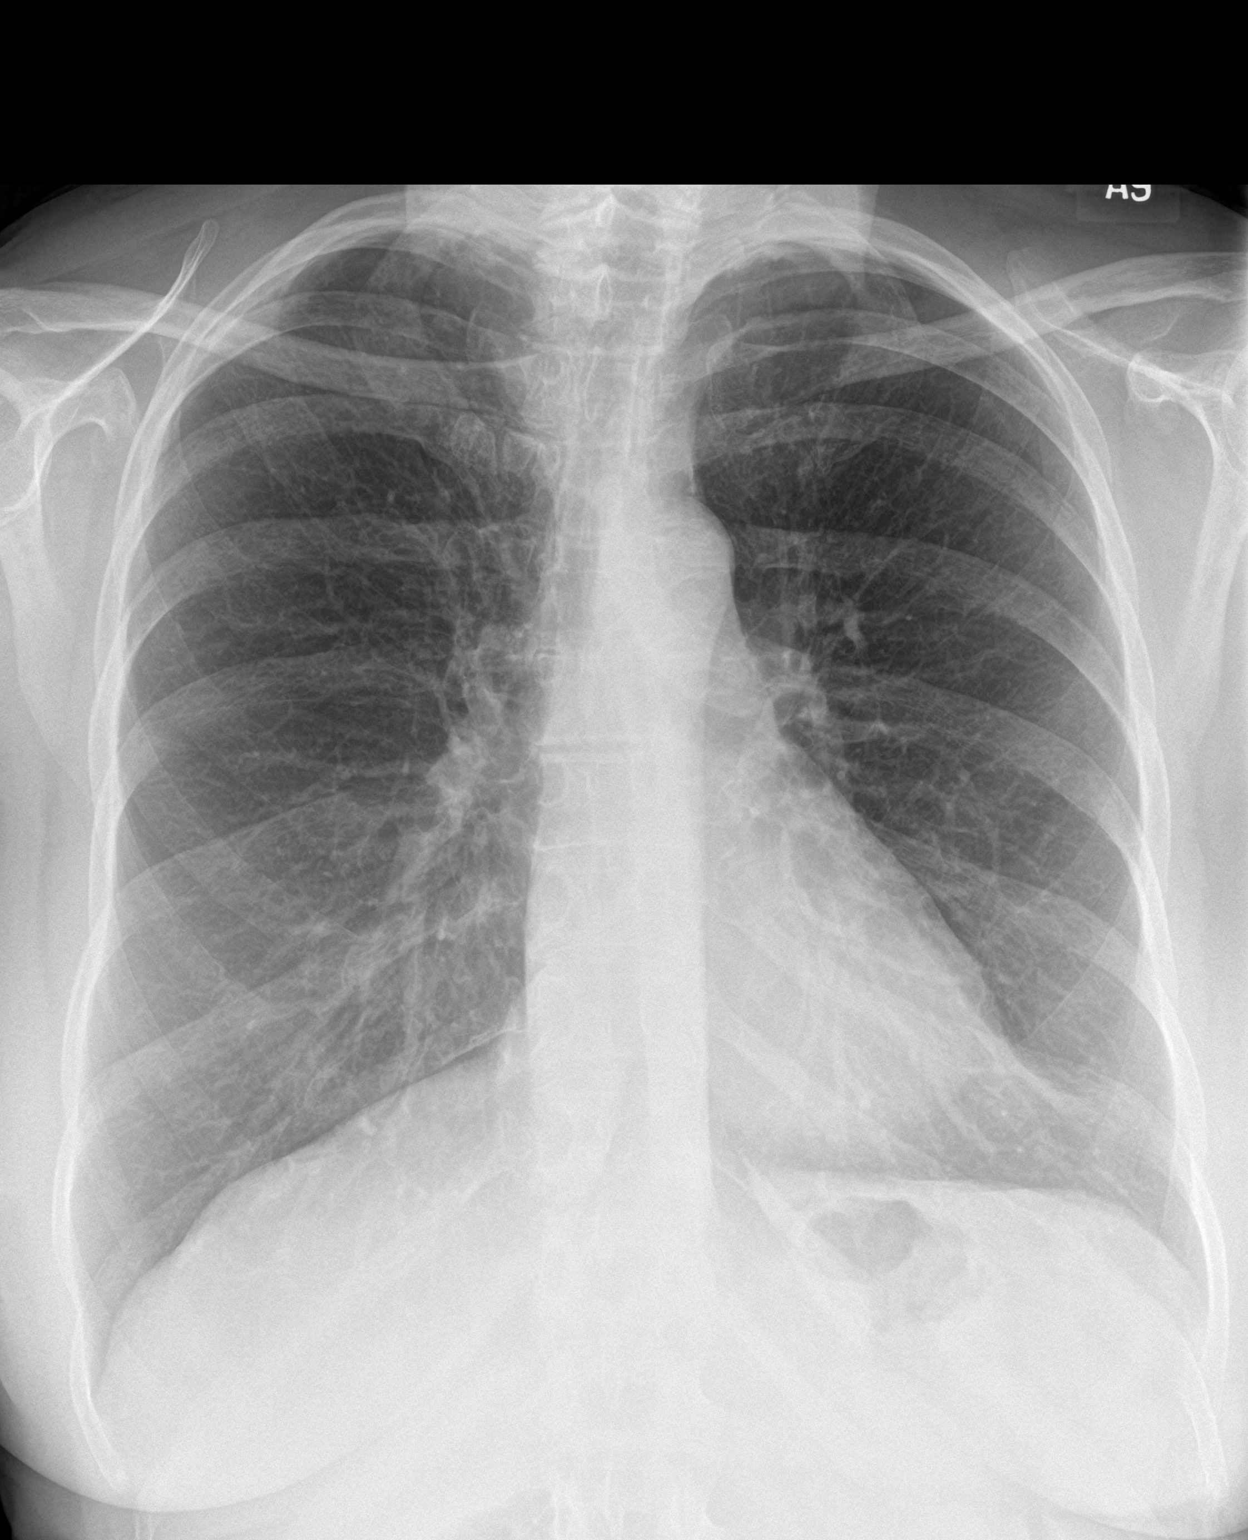

[chest lat]
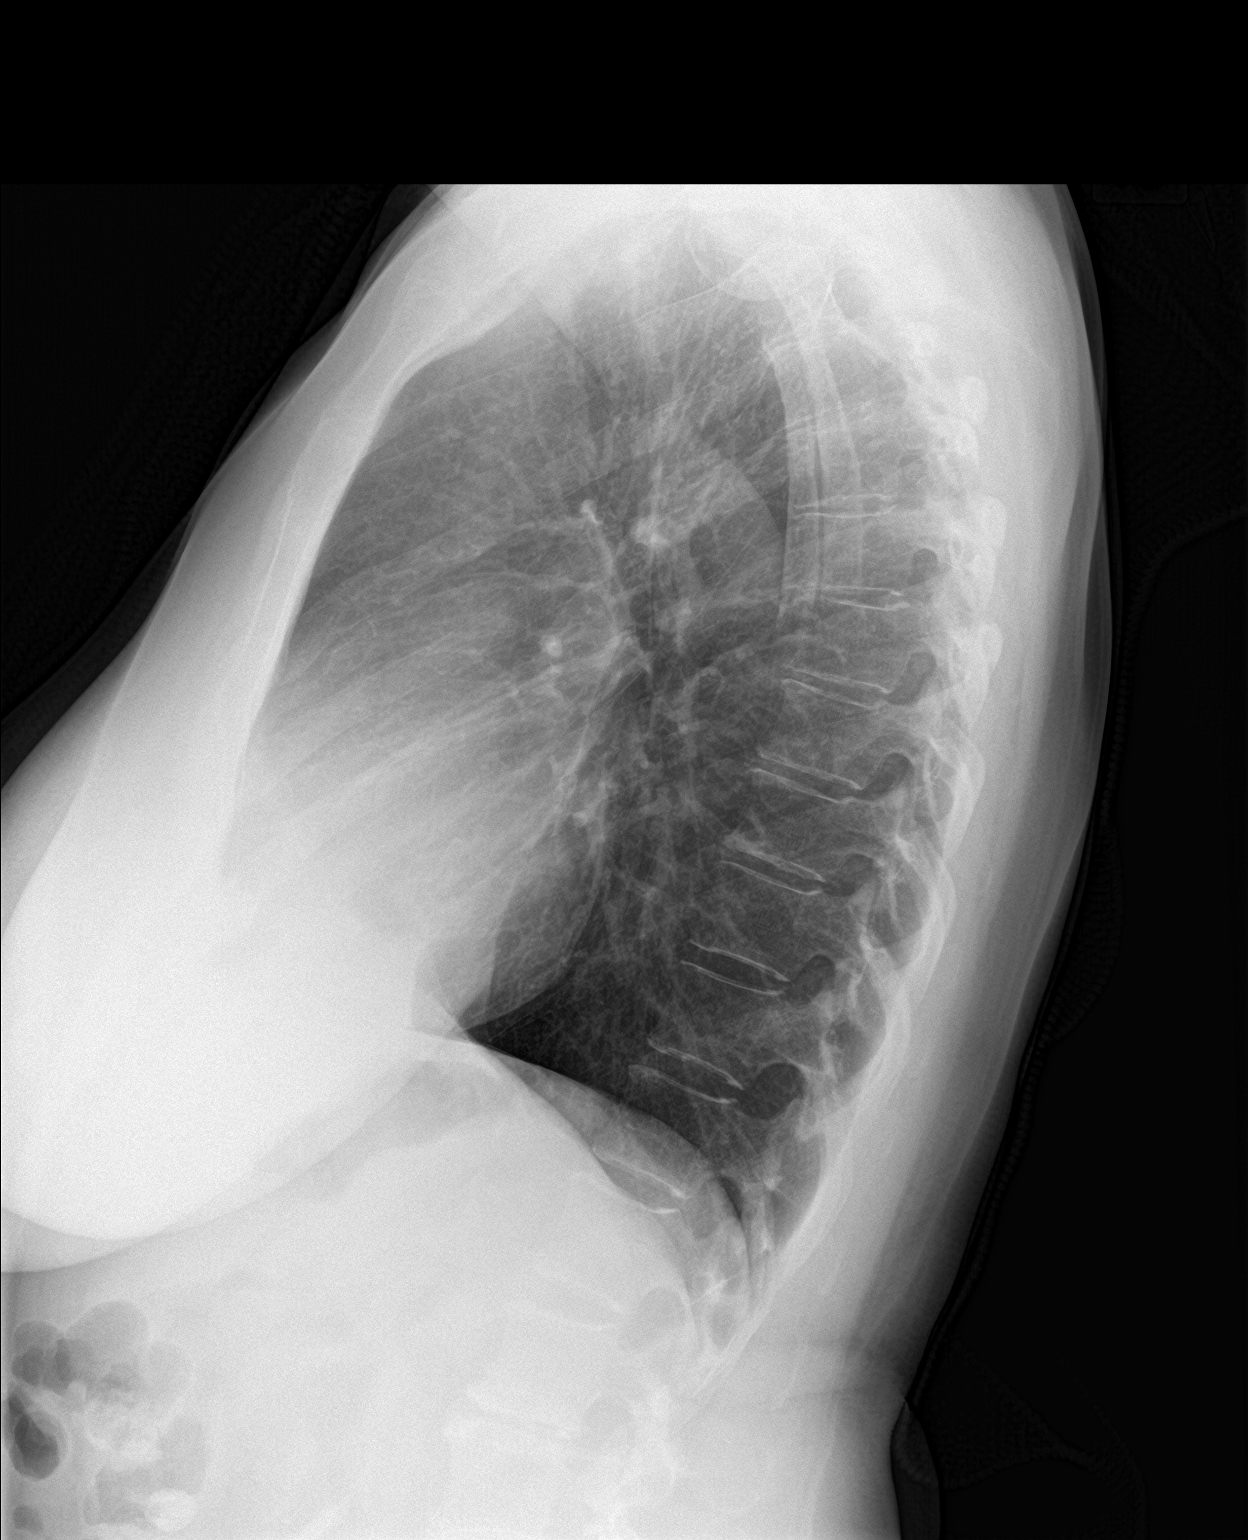

[2 of 2 positions shown; findings below may reference images not displayed]

FINDINGS: The cardiac silhouette, mediastinal and hilar contours are normal
and stable. The lungs are clear. Mild hyperinflation. No pulmonary
lesions. No pleural effusion. The bony thorax is intact.
IMPRESSION: Hyperinflation but no infiltrates, edema or effusions.

## 2019-04-21 DIAGNOSIS — Z8639 Personal history of other endocrine, nutritional and metabolic disease: Secondary | ICD-10-CM | POA: Insufficient documentation

## 2019-06-02 ENCOUNTER — Other Ambulatory Visit: Payer: 59

## 2019-06-07 NOTE — Telephone Encounter (Signed)
Medication list updated.--removed crestor and added Lipitor 10 mg daily.  Will route to Dr. Harrington Challenger to make aware and for any further recommendations. Replied to patient to let her know.

## 2019-06-15 ENCOUNTER — Ambulatory Visit: Payer: 59 | Admitting: Internal Medicine

## 2019-06-22 ENCOUNTER — Other Ambulatory Visit: Payer: Self-pay

## 2019-06-22 ENCOUNTER — Other Ambulatory Visit (INDEPENDENT_AMBULATORY_CARE_PROVIDER_SITE_OTHER): Payer: 59

## 2019-06-22 DIAGNOSIS — I10 Essential (primary) hypertension: Secondary | ICD-10-CM | POA: Diagnosis not present

## 2019-06-22 DIAGNOSIS — E785 Hyperlipidemia, unspecified: Secondary | ICD-10-CM | POA: Diagnosis not present

## 2019-06-22 DIAGNOSIS — R002 Palpitations: Secondary | ICD-10-CM | POA: Diagnosis not present

## 2019-06-22 DIAGNOSIS — I519 Heart disease, unspecified: Secondary | ICD-10-CM

## 2019-06-22 LAB — CBC WITH DIFFERENTIAL/PLATELET
Basophils Absolute: 0.1 10*3/uL (ref 0.0–0.1)
Basophils Relative: 1.4 % (ref 0.0–3.0)
Eosinophils Absolute: 0.2 10*3/uL (ref 0.0–0.7)
Eosinophils Relative: 3.5 % (ref 0.0–5.0)
HCT: 34.4 % — ABNORMAL LOW (ref 36.0–46.0)
Hemoglobin: 11.8 g/dL — ABNORMAL LOW (ref 12.0–15.0)
Lymphocytes Relative: 43 % (ref 12.0–46.0)
Lymphs Abs: 2.2 10*3/uL (ref 0.7–4.0)
MCHC: 34.4 g/dL (ref 30.0–36.0)
MCV: 92.7 fl (ref 78.0–100.0)
Monocytes Absolute: 0.5 10*3/uL (ref 0.1–1.0)
Monocytes Relative: 10.2 % (ref 3.0–12.0)
Neutro Abs: 2.2 10*3/uL (ref 1.4–7.7)
Neutrophils Relative %: 41.9 % — ABNORMAL LOW (ref 43.0–77.0)
Platelets: 239 10*3/uL (ref 150.0–400.0)
RBC: 3.71 Mil/uL — ABNORMAL LOW (ref 3.87–5.11)
RDW: 13 % (ref 11.5–15.5)
WBC: 5.2 10*3/uL (ref 4.0–10.5)

## 2019-06-22 LAB — IRON,TIBC AND FERRITIN PANEL
%SAT: 35 % (calc) (ref 16–45)
Ferritin: 40 ng/mL (ref 16–288)
Iron: 107 ug/dL (ref 45–160)
TIBC: 308 mcg/dL (calc) (ref 250–450)

## 2019-06-22 LAB — BASIC METABOLIC PANEL
BUN: 28 mg/dL — ABNORMAL HIGH (ref 6–23)
CO2: 25 mEq/L (ref 19–32)
Calcium: 9.4 mg/dL (ref 8.4–10.5)
Chloride: 106 mEq/L (ref 96–112)
Creatinine, Ser: 1.12 mg/dL (ref 0.40–1.20)
GFR: 47.82 mL/min — ABNORMAL LOW (ref >60.00)
Glucose, Bld: 103 mg/dL — ABNORMAL HIGH (ref 70–99)
Potassium: 4.3 mEq/L (ref 3.5–5.1)
Sodium: 138 mEq/L (ref 135–145)

## 2019-06-22 LAB — LIPID PANEL
Cholesterol: 181 mg/dL (ref 0–200)
HDL: 82.4 mg/dL (ref >39.00)
LDL Cholesterol: 85 mg/dL (ref 0–99)
NonHDL: 98.85
Total CHOL/HDL Ratio: 2
Triglycerides: 67 mg/dL (ref 0.0–149.0)
VLDL: 13.4 mg/dL (ref 0.0–40.0)

## 2019-06-22 LAB — HEPATIC FUNCTION PANEL
ALT: 18 U/L (ref 0–35)
AST: 18 U/L (ref 0–37)
Albumin: 4.4 g/dL (ref 3.5–5.2)
Alkaline Phosphatase: 41 U/L (ref 39–117)
Bilirubin, Direct: 0.1 mg/dL (ref 0.0–0.3)
Total Bilirubin: 0.5 mg/dL (ref 0.2–1.2)
Total Protein: 7 g/dL (ref 6.0–8.3)

## 2019-06-22 LAB — T4, FREE: Free T4: 0.67 ng/dL (ref 0.60–1.60)

## 2019-06-22 LAB — MAGNESIUM: Magnesium: 2 mg/dL (ref 1.5–2.5)

## 2019-06-22 LAB — TSH: TSH: 3.59 u[IU]/mL (ref 0.35–4.50)

## 2019-06-22 LAB — T3, FREE: T3, Free: 3 pg/mL (ref 2.3–4.2)

## 2019-06-23 ENCOUNTER — Ambulatory Visit (INDEPENDENT_AMBULATORY_CARE_PROVIDER_SITE_OTHER): Payer: 59 | Admitting: Internal Medicine

## 2019-06-23 ENCOUNTER — Other Ambulatory Visit: Payer: Self-pay

## 2019-06-23 ENCOUNTER — Encounter: Payer: Self-pay | Admitting: Internal Medicine

## 2019-06-23 VITALS — BP 138/64 | HR 62 | Temp 98.2°F | Ht 61.0 in | Wt 128.0 lb

## 2019-06-23 DIAGNOSIS — E559 Vitamin D deficiency, unspecified: Secondary | ICD-10-CM | POA: Diagnosis not present

## 2019-06-23 DIAGNOSIS — I10 Essential (primary) hypertension: Secondary | ICD-10-CM | POA: Diagnosis not present

## 2019-06-23 DIAGNOSIS — G459 Transient cerebral ischemic attack, unspecified: Secondary | ICD-10-CM

## 2019-06-23 DIAGNOSIS — E785 Hyperlipidemia, unspecified: Secondary | ICD-10-CM

## 2019-06-23 MED ORDER — VITAMIN D3 50 MCG (2000 UT) PO CAPS: 2000.0000 [IU] | ORAL_CAPSULE | Freq: Every day | ORAL | 3 refills | Status: AC

## 2019-06-23 NOTE — Assessment & Plan Note (Signed)
Dr Harrington Challenger. On Lipitor

## 2019-06-23 NOTE — Assessment & Plan Note (Signed)
Much better Lipitor

## 2019-06-23 NOTE — Progress Notes (Signed)
Subjective:  Patient ID: [name], female    DOB: 02/10/1945  Age: 75 y.o. MRN: [id number]  CC: No chief complaint on file.   HPI [name] presents for dyslipidemia, HTN, renal disease/CRI  f/u  Outpatient Medications Prior to Visit  Medication Sig Dispense Refill  . ARNUITY ELLIPTA 100 MCG/ACT AEPB INHALE 1 PUFF BY MOUTH INTO THE LUNGS EVERY DAY 30 each 5  . aspirin EC 81 MG tablet Take by mouth.    Marland Kitchen atorvastatin (LIPITOR) 10 MG tablet Take 10 mg by mouth daily.    Marland Kitchen atorvastatin (LIPITOR) 10 MG tablet Take by mouth.    . Calcium Citrate (CITRACAL PO) Take by mouth daily.    . cetirizine (ZYRTEC) 10 MG tablet Take 10 mg by mouth as needed.     Marland Kitchen EPINEPHrine (EPIPEN 2-PAK) 0.3 mg/0.3 mL IJ SOAJ injection As directed 1 each 3  . estradiol (ESTRACE) 0.1 MG/GM vaginal cream Place 2 g vaginally 3 (three) times a week.     . metoprolol succinate (TOPROL XL) 25 MG 24 hr tablet Take 0.5 tablets (12.5 mg total) by mouth daily as needed. 45 tablet 3  . olmesartan (BENICAR) 40 MG tablet Take 1 tablet (40 mg total) by mouth daily. 90 tablet 3  . omega-3 acid ethyl esters (LOVAZA) 1 g capsule Take 2 capsules (2 g total) by mouth 2 (two) times daily. 120 capsule 11  . Pediatric Multiple Vit-C-FA (MULTIVITAMIN ANIMAL SHAPES, WITH CA/FA,) WITH C & FA CHEW Chew 1 tablet by mouth daily.      Marland Kitchen tretinoin (RETIN-A) 0.05 % cream      No facility-administered medications prior to visit.    ROS: Review of Systems  Constitutional: Negative for activity change, appetite change, chills, fatigue and unexpected weight change.  HENT: Negative for congestion, mouth sores and sinus pressure.   Eyes: Negative for visual disturbance.  Respiratory: Negative for cough and chest tightness.   Gastrointestinal: Negative for abdominal pain and nausea.  Genitourinary: Negative for difficulty urinating, frequency and vaginal pain.  Musculoskeletal: Negative for back pain and gait problem.  Skin: Negative  for pallor and rash.  Neurological: Negative for dizziness, tremors, weakness, numbness and headaches.  Psychiatric/Behavioral: Negative for confusion and sleep disturbance.    Objective:  BP 138/64 (BP Location: Left Arm, Patient Position: Sitting, Cuff Size: Normal)   Pulse 62   Temp 98.2 F (36.8 C) (Oral)   Ht 5\' 1"  (1.549 m)   Wt 128 lb (58.1 kg)   SpO2 98%   BMI 24.19 kg/m   BP Readings from Last 3 Encounters:  06/23/19 138/64  04/07/19 133/71  03/14/19 126/68    Wt Readings from Last 3 Encounters:  06/23/19 128 lb (58.1 kg)  04/07/19 129 lb 12.8 oz (58.9 kg)  03/14/19 135 lb (61.2 kg)    Physical Exam Constitutional:      General: She is not in acute distress.    Appearance: She is well-developed.  HENT:     Head: Normocephalic.     Right Ear: External ear normal.     Left Ear: External ear normal.     Nose: Nose normal.  Eyes:     General:        Right eye: No discharge.        Left eye: No discharge.     Conjunctiva/sclera: Conjunctivae normal.     Pupils: Pupils are equal, round, and reactive to light.  Neck:     Thyroid:  No thyromegaly.     Vascular: No JVD.     Trachea: No tracheal deviation.  Cardiovascular:     Rate and Rhythm: Normal rate and regular rhythm.     Heart sounds: Normal heart sounds.  Pulmonary:     Effort: No respiratory distress.     Breath sounds: No stridor. No wheezing.  Abdominal:     General: Bowel sounds are normal. There is no distension.     Palpations: Abdomen is soft. There is no mass.     Tenderness: There is no abdominal tenderness. There is no guarding or rebound.  Musculoskeletal:        General: No tenderness.     Cervical back: Normal range of motion and neck supple.  Lymphadenopathy:     Cervical: No cervical adenopathy.  Skin:    Findings: No erythema or rash.  Neurological:     Mental Status: She is oriented to person, place, and time.     Cranial Nerves: No cranial nerve deficit.     Motor: No  abnormal muscle tone.     Coordination: Coordination normal.     Deep Tendon Reflexes: Reflexes normal.  Psychiatric:        Behavior: Behavior normal.        Thought Content: Thought content normal.        Judgment: Judgment normal.     Lab Results  Component Value Date   WBC 5.2 06/22/2019   HGB 11.8 (L) 06/22/2019   HCT 34.4 (L) 06/22/2019   PLT 239.0 06/22/2019   GLUCOSE 103 (H) 06/22/2019   CHOL 181 06/22/2019   TRIG 67.0 06/22/2019   HDL 82.40 06/22/2019   LDLDIRECT 126.2 10/06/2012   LDLCALC 85 06/22/2019   ALT 18 06/22/2019   AST 18 06/22/2019   NA 138 06/22/2019   K 4.3 06/22/2019   CL 106 06/22/2019   CREATININE 1.12 06/22/2019   BUN 28 (H) 06/22/2019   CO2 25 06/22/2019   TSH 3.59 06/22/2019   INR 0.95 07/20/2010   HGBA1C 5.8 06/17/2018    CT CARDIAC SCORING  Addendum Date: 12/21/2017   ADDENDUM REPORT: 12/21/2017 12:24 CLINICAL DATA:  35F with hypertension and hyperlipidemia for risk stratification EXAM: Coronary Calcium Score TECHNIQUE: The patient was scanned on a [employer] scanner. Axial non-contrast 3 mm slices were carried out through the heart. The data set was analyzed on a dedicated work station and scored using the [place]. FINDINGS: Non-cardiac: See separate report from [facility]. Ascending Aorta: Normal in size.  No calcification. Pericardium: Normal Coronary arteries: Right dominant. Calcification noted in the proximal left circumflex. IMPRESSION: Coronary calcium score of 3.3. This was percentile 34th for age and sex matched control. Skeet Latch, MD Electronically Signed   By: Skeet Latch   On: 12/21/2017 12:24   Result Date: 12/21/2017 EXAM: OVER-READ INTERPRETATION  CT CHEST The following report is an over-read performed by radiologist Dr. Rolm Baptise of [facility], [place] on 12/21/2017. This over-read does not include interpretation of cardiac or coronary anatomy or pathology. The coronary calcium score  interpretation by the cardiologist is attached. COMPARISON:  None. FINDINGS: Vascular: Essential height crash that heart is normal size. Visualized aorta is normal caliber. Mediastinum/Nodes: No adenopathy in the lower mediastinum or hila. Small hiatal hernia. Lungs/Pleura: Visualized lungs clear.  No effusions. Upper Abdomen: Imaging into the upper abdomen shows no acute findings. Musculoskeletal: Chest wall soft tissues are unremarkable. No acute bony abnormality. IMPRESSION: No acute or significant extracardiac  abnormality. Electronically Signed: By: Rolm Baptise M.D. On: 12/21/2017 11:10    Assessment & Plan:   There are no diagnoses linked to this encounter.   No orders of the defined types were placed in this encounter.    Follow-up: No follow-ups on file.  Walker Kehr, MD

## 2019-06-23 NOTE — Assessment & Plan Note (Signed)
Wt Readings from Last 3 Encounters:  06/23/19 128 lb (58.1 kg)  04/07/19 129 lb 12.8 oz (58.9 kg)  03/14/19 135 lb (61.2 kg)

## 2019-09-28 ENCOUNTER — Telehealth: Payer: Self-pay | Admitting: Internal Medicine

## 2019-09-28 NOTE — Telephone Encounter (Signed)
Left message for patient to call back. Sent MyChart message also.--to see if patient is able to come in tomorrow at 10:40 am w Dr. Harrington Challenger.

## 2019-09-28 NOTE — Telephone Encounter (Signed)
Patient sent a message requesting an appointment through patient schedule. She states she is having a "Very irregular heart rate". Please advise.

## 2019-09-29 ENCOUNTER — Encounter: Payer: Self-pay | Admitting: Internal Medicine

## 2019-09-29 ENCOUNTER — Ambulatory Visit (INDEPENDENT_AMBULATORY_CARE_PROVIDER_SITE_OTHER): Payer: 59 | Admitting: Internal Medicine

## 2019-09-29 ENCOUNTER — Other Ambulatory Visit: Payer: Self-pay

## 2019-09-29 VITALS — BP 118/68 | HR 67 | Ht 61.0 in | Wt 130.4 lb

## 2019-09-29 DIAGNOSIS — R002 Palpitations: Secondary | ICD-10-CM | POA: Diagnosis not present

## 2019-09-29 LAB — TSH: TSH: 3.23 u[IU]/mL (ref 0.450–4.500)

## 2019-09-29 NOTE — Patient Instructions (Addendum)
Medication Instructions:  No changes today *If you need a refill on your cardiac medications before your next appointment, please call your pharmacy*   Lab Work: Today: TSH If you have labs (blood work) drawn today and your tests are completely normal, you will receive your results only by: Marland Kitchen MyChart Message (if you have MyChart) OR . A paper copy in the mail If you have any lab test that is abnormal or we need to change your treatment, we will call you to review the results.   Testing/Procedures: Your physician has recommended that you wear an event monitor. Event monitors are medical devices that record the heart's electrical activity. Doctors most often [place] these monitors to diagnose arrhythmias. Arrhythmias are problems with the speed or rhythm of the heartbeat. The monitor is a small, portable device. You can wear one while you do your normal daily activities. This is usually used to diagnose what is causing palpitations/syncope (passing out).   Follow-Up: Follow up with your physician will depend on test results.   Other Instructions

## 2019-09-29 NOTE — Telephone Encounter (Signed)
Patient seen in office w Dr. Harrington Challenger today.
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
## 2019-10-05 ENCOUNTER — Telehealth: Payer: 59 | Admitting: Physician Assistant

## 2019-10-06 ENCOUNTER — Other Ambulatory Visit: Payer: Self-pay | Admitting: *Deleted

## 2019-10-06 ENCOUNTER — Telehealth: Payer: Self-pay | Admitting: Radiology

## 2019-10-06 NOTE — Telephone Encounter (Signed)
Enrolled patient for a 30 day [employer] Event Monitor to be mailed to patients home  

## 2019-10-10 ENCOUNTER — Other Ambulatory Visit: Payer: Self-pay

## 2019-10-10 MED ORDER — OLMESARTAN MEDOXOMIL 40 MG PO TABS: 40.0000 mg | ORAL_TABLET | Freq: Every day | ORAL | 3 refills | Status: DC

## 2019-10-13 ENCOUNTER — Encounter (INDEPENDENT_AMBULATORY_CARE_PROVIDER_SITE_OTHER): Payer: 59

## 2019-10-13 DIAGNOSIS — R002 Palpitations: Secondary | ICD-10-CM | POA: Diagnosis not present

## 2019-10-23 ENCOUNTER — Other Ambulatory Visit: Payer: Self-pay | Admitting: Internal Medicine

## 2019-11-18 ENCOUNTER — Ambulatory Visit: Payer: 59

## 2019-11-24 ENCOUNTER — Telehealth: Payer: Self-pay | Admitting: Internal Medicine

## 2019-11-24 NOTE — Telephone Encounter (Signed)
Transferred call to [facility] for monitor results.

## 2019-12-21 ENCOUNTER — Other Ambulatory Visit (INDEPENDENT_AMBULATORY_CARE_PROVIDER_SITE_OTHER): Payer: 59

## 2019-12-21 ENCOUNTER — Other Ambulatory Visit: Payer: Self-pay

## 2019-12-21 DIAGNOSIS — I10 Essential (primary) hypertension: Secondary | ICD-10-CM | POA: Diagnosis not present

## 2019-12-21 DIAGNOSIS — E785 Hyperlipidemia, unspecified: Secondary | ICD-10-CM | POA: Diagnosis not present

## 2019-12-21 DIAGNOSIS — G459 Transient cerebral ischemic attack, unspecified: Secondary | ICD-10-CM | POA: Diagnosis not present

## 2019-12-21 DIAGNOSIS — E559 Vitamin D deficiency, unspecified: Secondary | ICD-10-CM

## 2019-12-21 LAB — BASIC METABOLIC PANEL
BUN: 26 mg/dL — ABNORMAL HIGH (ref 6–23)
CO2: 27 mEq/L (ref 19–32)
Calcium: 9.1 mg/dL (ref 8.4–10.5)
Chloride: 106 mEq/L (ref 96–112)
Creatinine, Ser: 1.23 mg/dL — ABNORMAL HIGH (ref 0.40–1.20)
GFR: 43.91 mL/min — ABNORMAL LOW (ref >60.00)
Glucose, Bld: 98 mg/dL (ref 70–99)
Potassium: 4.6 mEq/L (ref 3.5–5.1)
Sodium: 139 mEq/L (ref 135–145)

## 2019-12-21 LAB — URINALYSIS
Bilirubin Urine: NEGATIVE
Hgb urine dipstick: NEGATIVE
Ketones, ur: NEGATIVE
Leukocytes,Ua: NEGATIVE
Nitrite: NEGATIVE
Specific Gravity, Urine: 1.02 (ref 1.000–1.030)
Total Protein, Urine: NEGATIVE
Urine Glucose: NEGATIVE
Urobilinogen, UA: 0.2 (ref 0.0–1.0)
pH: 6.5 (ref 5.0–8.0)

## 2019-12-21 LAB — CBC WITH DIFFERENTIAL/PLATELET
Basophils Absolute: 0 10*3/uL (ref 0.0–0.1)
Basophils Relative: 1 % (ref 0.0–3.0)
Eosinophils Absolute: 0.2 10*3/uL (ref 0.0–0.7)
Eosinophils Relative: 4.6 % (ref 0.0–5.0)
HCT: 34.7 % — ABNORMAL LOW (ref 36.0–46.0)
Hemoglobin: 11.7 g/dL — ABNORMAL LOW (ref 12.0–15.0)
Lymphocytes Relative: 50 % — ABNORMAL HIGH (ref 12.0–46.0)
Lymphs Abs: 2.3 10*3/uL (ref 0.7–4.0)
MCHC: 33.6 g/dL (ref 30.0–36.0)
MCV: 91.4 fl (ref 78.0–100.0)
Monocytes Absolute: 0.4 10*3/uL (ref 0.1–1.0)
Monocytes Relative: 9.2 % (ref 3.0–12.0)
Neutro Abs: 1.6 10*3/uL (ref 1.4–7.7)
Neutrophils Relative %: 35.2 % — ABNORMAL LOW (ref 43.0–77.0)
Platelets: 251 10*3/uL (ref 150.0–400.0)
RBC: 3.79 Mil/uL — ABNORMAL LOW (ref 3.87–5.11)
RDW: 12.8 % (ref 11.5–15.5)
WBC: 4.6 10*3/uL (ref 4.0–10.5)

## 2019-12-21 LAB — LIPID PANEL
Cholesterol: 189 mg/dL (ref 0–200)
HDL: 73.7 mg/dL (ref >39.00)
LDL Cholesterol: 96 mg/dL (ref 0–99)
NonHDL: 115.48
Total CHOL/HDL Ratio: 3
Triglycerides: 98 mg/dL (ref 0.0–149.0)
VLDL: 19.6 mg/dL (ref 0.0–40.0)

## 2019-12-21 LAB — HEPATIC FUNCTION PANEL
ALT: 17 U/L (ref 0–35)
AST: 17 U/L (ref 0–37)
Albumin: 4.1 g/dL (ref 3.5–5.2)
Alkaline Phosphatase: 44 U/L (ref 39–117)
Bilirubin, Direct: 0.1 mg/dL (ref 0.0–0.3)
Total Bilirubin: 0.5 mg/dL (ref 0.2–1.2)
Total Protein: 6.7 g/dL (ref 6.0–8.3)

## 2019-12-21 LAB — TSH: TSH: 6.57 u[IU]/mL — ABNORMAL HIGH (ref 0.35–4.50)

## 2019-12-21 LAB — VITAMIN D 25 HYDROXY (VIT D DEFICIENCY, FRACTURES): VITD: 37.63 ng/mL (ref 30.00–100.00)

## 2019-12-22 ENCOUNTER — Ambulatory Visit (INDEPENDENT_AMBULATORY_CARE_PROVIDER_SITE_OTHER): Payer: 59 | Admitting: Internal Medicine

## 2019-12-22 ENCOUNTER — Other Ambulatory Visit: Payer: Self-pay

## 2019-12-22 ENCOUNTER — Encounter: Payer: Self-pay | Admitting: Internal Medicine

## 2019-12-22 VITALS — BP 130/62 | HR 80 | Temp 98.8°F | Ht 61.0 in | Wt 132.0 lb

## 2019-12-22 DIAGNOSIS — E785 Hyperlipidemia, unspecified: Secondary | ICD-10-CM

## 2019-12-22 DIAGNOSIS — R002 Palpitations: Secondary | ICD-10-CM | POA: Diagnosis not present

## 2019-12-22 DIAGNOSIS — J4540 Moderate persistent asthma, uncomplicated: Secondary | ICD-10-CM

## 2019-12-22 DIAGNOSIS — I10 Essential (primary) hypertension: Secondary | ICD-10-CM

## 2019-12-22 DIAGNOSIS — R7989 Other specified abnormal findings of blood chemistry: Secondary | ICD-10-CM

## 2019-12-22 DIAGNOSIS — D649 Anemia, unspecified: Secondary | ICD-10-CM | POA: Diagnosis not present

## 2019-12-22 DIAGNOSIS — N183 Chronic kidney disease, stage 3 unspecified: Secondary | ICD-10-CM

## 2019-12-22 DIAGNOSIS — Q613 Polycystic kidney, unspecified: Secondary | ICD-10-CM

## 2019-12-22 NOTE — Assessment & Plan Note (Signed)
FT4, FT3, TSH 

## 2019-12-22 NOTE — Assessment & Plan Note (Signed)
[name] had a monitor in Oct 2021 PVCs

## 2019-12-22 NOTE — Assessment & Plan Note (Signed)
Due to CRI CBC

## 2019-12-22 NOTE — Progress Notes (Signed)
Subjective:  Patient ID: [name], female    DOB: 10/31/1945  Age: 74 y.o. MRN: [id number]  CC: Annual Exam   HPI [name] presents for abn TSH, Vit D def, HTN  Outpatient Medications Prior to Visit  Medication Sig Dispense Refill  . ARNUITY ELLIPTA 100 MCG/ACT AEPB INHALE 1 PUFF BY MOUTH INTO THE LUNGS EVERY DAY 30 each 5  . aspirin EC 81 MG tablet Take 81 mg by mouth daily.     Marland Kitchen atorvastatin (LIPITOR) 10 MG tablet Take 10 mg by mouth daily.     . Calcium Citrate (CITRACAL PO) Take 1 capsule by mouth daily.     . cetirizine (ZYRTEC) 10 MG tablet Take 10 mg by mouth as needed for allergies.    . Cholecalciferol (VITAMIN D3) 50 MCG (2000 UT) capsule Take 1 capsule (2,000 Units total) by mouth daily. 100 capsule 3  . cyclobenzaprine (FLEXERIL) 10 MG tablet Take 10 mg by mouth 2 (two) times daily as needed.    Marland Kitchen EPINEPHrine (EPIPEN 2-PAK) 0.3 mg/0.3 mL IJ SOAJ injection As directed (Patient taking differently: Inject 0.3 mg into the muscle as needed for anaphylaxis. As directed) 1 each 3  . estradiol (ESTRACE) 0.1 MG/GM vaginal cream Place 2 g vaginally 3 (three) times a week.    . metoprolol succinate (TOPROL XL) 25 MG 24 hr tablet Take 0.5 tablets (12.5 mg total) by mouth daily as needed. 45 tablet 3  . olmesartan (BENICAR) 40 MG tablet Take 1 tablet (40 mg total) by mouth daily. 90 tablet 3  . omega-3 acid ethyl esters (LOVAZA) 1 g capsule Take 2 capsules (2 g total) by mouth 2 (two) times daily. 120 capsule 11  . Pediatric Multiple Vit-C-FA (MULTIVITAMIN ANIMAL SHAPES, WITH CA/FA,) WITH C & FA CHEW Chew 1 tablet by mouth daily.    Marland Kitchen tretinoin (RETIN-A) 0.05 % cream      No facility-administered medications prior to visit.    ROS: Review of Systems  Constitutional: Negative for activity change, appetite change, chills, fatigue and unexpected weight change.  HENT: Negative for congestion, mouth sores and sinus pressure.   Eyes: Negative for visual disturbance.   Respiratory: Positive for wheezing. Negative for cough and chest tightness.   Cardiovascular: Positive for palpitations.  Gastrointestinal: Negative for abdominal pain and nausea.  Genitourinary: Negative for difficulty urinating, frequency and vaginal pain.  Musculoskeletal: Negative for back pain and gait problem.  Skin: Negative for pallor and rash.  Neurological: Negative for dizziness, tremors, weakness, numbness and headaches.  Psychiatric/Behavioral: Negative for confusion and sleep disturbance.    Objective:  BP 130/62 (BP Location: Left Arm)   Pulse 80   Temp 98.8 F (37.1 C) (Oral)   Ht 5\' 1"  (1.549 m)   Wt 132 lb (59.9 kg)   SpO2 96%   BMI 24.94 kg/m   BP Readings from Last 3 Encounters:  12/22/19 130/62  09/29/19 118/68  06/23/19 138/64    Wt Readings from Last 3 Encounters:  12/22/19 132 lb (59.9 kg)  09/29/19 130 lb 6.4 oz (59.1 kg)  06/23/19 128 lb (58.1 kg)    Physical Exam Constitutional:      General: She is not in acute distress.    Appearance: She is well-developed.  HENT:     Head: Normocephalic.     Right Ear: External ear normal.     Left Ear: External ear normal.     Nose: Nose normal.     Mouth/Throat:  Mouth: Oropharynx is clear and moist.  Eyes:     General:        Right eye: No discharge.        Left eye: No discharge.     Conjunctiva/sclera: Conjunctivae normal.     Pupils: Pupils are equal, round, and reactive to light.  Neck:     Thyroid: No thyromegaly.     Vascular: No JVD.     Trachea: No tracheal deviation.  Cardiovascular:     Rate and Rhythm: Normal rate and regular rhythm.     Heart sounds: Normal heart sounds.  Pulmonary:     Effort: No respiratory distress.     Breath sounds: No stridor. No wheezing.  Abdominal:     General: Bowel sounds are normal. There is no distension.     Palpations: Abdomen is soft. There is no mass.     Tenderness: There is no abdominal tenderness. There is no guarding or rebound.   Musculoskeletal:        General: No tenderness or edema.     Cervical back: Normal range of motion and neck supple.  Lymphadenopathy:     Cervical: No cervical adenopathy.  Skin:    Findings: No erythema or rash.  Neurological:     Cranial Nerves: No cranial nerve deficit.     Motor: No abnormal muscle tone.     Coordination: Coordination normal.     Deep Tendon Reflexes: Reflexes normal.  Psychiatric:        Mood and Affect: Mood and affect normal.        Behavior: Behavior normal.        Thought Content: Thought content normal.        Judgment: Judgment normal.   PVCs  Lab Results  Component Value Date   WBC 4.6 12/21/2019   HGB 11.7 (L) 12/21/2019   HCT 34.7 (L) 12/21/2019   PLT 251.0 12/21/2019   GLUCOSE 98 12/21/2019   CHOL 189 12/21/2019   TRIG 98.0 12/21/2019   HDL 73.70 12/21/2019   LDLDIRECT 126.2 10/06/2012   LDLCALC 96 12/21/2019   ALT 17 12/21/2019   AST 17 12/21/2019   NA 139 12/21/2019   K 4.6 12/21/2019   CL 106 12/21/2019   CREATININE 1.23 (H) 12/21/2019   BUN 26 (H) 12/21/2019   CO2 27 12/21/2019   TSH 6.57 (H) 12/21/2019   INR 0.95 07/20/2010   HGBA1C 5.8 06/17/2018    CT CARDIAC SCORING  Addendum Date: 12/21/2017   ADDENDUM REPORT: 12/21/2017 12:24 CLINICAL DATA:  73F with hypertension and hyperlipidemia for risk stratification EXAM: Coronary Calcium Score TECHNIQUE: The patient was scanned on a [employer] scanner. Axial non-contrast 3 mm slices were carried out through the heart. The data set was analyzed on a dedicated work station and scored using the [place]. FINDINGS: Non-cardiac: See separate report from [facility]. Ascending Aorta: Normal in size.  No calcification. Pericardium: Normal Coronary arteries: Right dominant. Calcification noted in the proximal left circumflex. IMPRESSION: Coronary calcium score of 3.3. This was percentile 34th for age and sex matched control. Skeet Latch, MD Electronically Signed   By:  Skeet Latch   On: 12/21/2017 12:24   Result Date: 12/21/2017 EXAM: OVER-READ INTERPRETATION  CT CHEST The following report is an over-read performed by radiologist Dr. Rolm Baptise of [facility], [place] on 12/21/2017. This over-read does not include interpretation of cardiac or coronary anatomy or pathology. The coronary calcium score interpretation by the cardiologist is attached.  COMPARISON:  None. FINDINGS: Vascular: Essential height crash that heart is normal size. Visualized aorta is normal caliber. Mediastinum/Nodes: No adenopathy in the lower mediastinum or hila. Small hiatal hernia. Lungs/Pleura: Visualized lungs clear.  No effusions. Upper Abdomen: Imaging into the upper abdomen shows no acute findings. Musculoskeletal: Chest wall soft tissues are unremarkable. No acute bony abnormality. IMPRESSION: No acute or significant extracardiac abnormality. Electronically Signed: By: Rolm Baptise M.D. On: 12/21/2017 11:10    Assessment & Plan:     Follow-up: No follow-ups on file.  Walker Kehr, MD

## 2019-12-22 NOTE — Assessment & Plan Note (Signed)
BP ok 

## 2019-12-22 NOTE — Assessment & Plan Note (Addendum)
On Lipitor 5 mg/d.  Continue with current therapy

## 2019-12-24 DIAGNOSIS — N183 Chronic kidney disease, stage 3 unspecified: Secondary | ICD-10-CM | POA: Insufficient documentation

## 2019-12-24 NOTE — Assessment & Plan Note (Addendum)
Stable.  Continue with Arnuity Ellipta

## 2019-12-24 NOTE — Assessment & Plan Note (Signed)
Continue with good blood pressure control.  Good hydration.  Nephrology follow-up

## 2019-12-24 NOTE — Assessment & Plan Note (Signed)
Follow-up with the nephrology periodic labs

## 2020-03-22 ENCOUNTER — Ambulatory Visit: Payer: 59 | Admitting: Internal Medicine

## 2020-04-04 ENCOUNTER — Other Ambulatory Visit: Payer: Self-pay | Admitting: Internal Medicine

## 2020-04-09 ENCOUNTER — Ambulatory Visit: Payer: 59 | Admitting: Internal Medicine

## 2020-04-24 ENCOUNTER — Other Ambulatory Visit (INDEPENDENT_AMBULATORY_CARE_PROVIDER_SITE_OTHER): Payer: Self-pay

## 2020-04-24 DIAGNOSIS — D649 Anemia, unspecified: Secondary | ICD-10-CM

## 2020-04-24 DIAGNOSIS — R002 Palpitations: Secondary | ICD-10-CM

## 2020-04-24 DIAGNOSIS — R7989 Other specified abnormal findings of blood chemistry: Secondary | ICD-10-CM

## 2020-04-24 LAB — CBC WITH DIFFERENTIAL/PLATELET
Basophils Absolute: 0 10*3/uL (ref 0.0–0.1)
Basophils Relative: 1 % (ref 0.0–3.0)
Eosinophils Absolute: 0.2 10*3/uL (ref 0.0–0.7)
Eosinophils Relative: 4.3 % (ref 0.0–5.0)
HCT: 34.1 % — ABNORMAL LOW (ref 36.0–46.0)
Hemoglobin: 11.6 g/dL — ABNORMAL LOW (ref 12.0–15.0)
Lymphocytes Relative: 50.7 % — ABNORMAL HIGH (ref 12.0–46.0)
Lymphs Abs: 2.4 10*3/uL (ref 0.7–4.0)
MCHC: 33.9 g/dL (ref 30.0–36.0)
MCV: 93.2 fl (ref 78.0–100.0)
Monocytes Absolute: 0.5 10*3/uL (ref 0.1–1.0)
Monocytes Relative: 10.6 % (ref 3.0–12.0)
Neutro Abs: 1.6 10*3/uL (ref 1.4–7.7)
Neutrophils Relative %: 33.4 % — ABNORMAL LOW (ref 43.0–77.0)
Platelets: 206 10*3/uL (ref 150.0–400.0)
RBC: 3.65 Mil/uL — ABNORMAL LOW (ref 3.87–5.11)
RDW: 12.9 % (ref 11.5–15.5)
WBC: 4.8 10*3/uL (ref 4.0–10.5)

## 2020-04-24 LAB — BASIC METABOLIC PANEL
BUN: 27 mg/dL — ABNORMAL HIGH (ref 6–23)
CO2: 25 mEq/L (ref 19–32)
Calcium: 8.9 mg/dL (ref 8.4–10.5)
Chloride: 107 mEq/L (ref 96–112)
Creatinine, Ser: 1.22 mg/dL — ABNORMAL HIGH (ref 0.40–1.20)
GFR: 44.24 mL/min — ABNORMAL LOW (ref >60.00)
Glucose, Bld: 91 mg/dL (ref 70–99)
Potassium: 4.4 mEq/L (ref 3.5–5.1)
Sodium: 141 mEq/L (ref 135–145)

## 2020-04-24 LAB — TSH: TSH: 4.33 u[IU]/mL (ref 0.35–4.50)

## 2020-04-24 LAB — T4, FREE: Free T4: 0.67 ng/dL (ref 0.60–1.60)

## 2020-04-24 LAB — T3, FREE: T3, Free: 2.3 pg/mL (ref 2.3–4.2)

## 2020-04-25 ENCOUNTER — Encounter: Payer: Self-pay | Admitting: Internal Medicine

## 2020-04-25 ENCOUNTER — Other Ambulatory Visit: Payer: Self-pay

## 2020-04-25 ENCOUNTER — Ambulatory Visit (INDEPENDENT_AMBULATORY_CARE_PROVIDER_SITE_OTHER): Payer: Medicare PPO | Admitting: Internal Medicine

## 2020-04-25 VITALS — BP 130/68 | HR 65 | Temp 98.3°F | Ht 61.0 in | Wt 131.0 lb

## 2020-04-25 DIAGNOSIS — R0989 Other specified symptoms and signs involving the circulatory and respiratory systems: Secondary | ICD-10-CM

## 2020-04-25 DIAGNOSIS — N183 Chronic kidney disease, stage 3 unspecified: Secondary | ICD-10-CM

## 2020-04-25 DIAGNOSIS — R7989 Other specified abnormal findings of blood chemistry: Secondary | ICD-10-CM | POA: Diagnosis not present

## 2020-04-25 DIAGNOSIS — J4521 Mild intermittent asthma with (acute) exacerbation: Secondary | ICD-10-CM

## 2020-04-25 DIAGNOSIS — I10 Essential (primary) hypertension: Secondary | ICD-10-CM

## 2020-04-25 DIAGNOSIS — R739 Hyperglycemia, unspecified: Secondary | ICD-10-CM

## 2020-04-25 DIAGNOSIS — Z91013 Allergy to seafood: Secondary | ICD-10-CM

## 2020-04-25 MED ORDER — EPINEPHRINE 0.3 MG/0.3ML IJ SOAJ: INTRAMUSCULAR | 3 refills | Status: DC

## 2020-04-25 NOTE — Assessment & Plan Note (Signed)
Monitor A1c 

## 2020-04-25 NOTE — Progress Notes (Signed)
Subjective:  Patient ID: [name], female    DOB: 11-09-1945  Age: 75 y.o. MRN: [id number]  CC: Follow-up (3 months f/u)   HPI [name] presents for asthma, HTN, hyperglycemia f/u  Outpatient Medications Prior to Visit  Medication Sig Dispense Refill  . ARNUITY ELLIPTA 100 MCG/ACT AEPB INHALE 1 PUFF BY MOUTH INTO THE LUNGS EVERY DAY 30 each 5  . aspirin EC 81 MG tablet Take 81 mg by mouth daily.     . Calcium Citrate (CITRACAL PO) Take 1 capsule by mouth daily.     . cetirizine (ZYRTEC) 10 MG tablet Take 10 mg by mouth as needed for allergies.    . Cholecalciferol (VITAMIN D3) 50 MCG (2000 UT) capsule Take 1 capsule (2,000 Units total) by mouth daily. 100 capsule 3  . cyclobenzaprine (FLEXERIL) 10 MG tablet Take 10 mg by mouth 2 (two) times daily as needed.    Marland Kitchen EPINEPHrine (EPIPEN 2-PAK) 0.3 mg/0.3 mL IJ SOAJ injection As directed (Patient taking differently: Inject 0.3 mg into the muscle as needed for anaphylaxis. As directed) 1 each 3  . estradiol (ESTRACE) 0.1 MG/GM vaginal cream Place 2 g vaginally 3 (three) times a week.    . metoprolol succinate (TOPROL XL) 25 MG 24 hr tablet Take 0.5 tablets (12.5 mg total) by mouth daily as needed. 45 tablet 3  . olmesartan (BENICAR) 40 MG tablet Take 1 tablet (40 mg total) by mouth daily. 90 tablet 3  . omega-3 acid ethyl esters (LOVAZA) 1 g capsule TAKE 2 CAPSULES BY MOUTH TWICE DAILY 120 capsule 11  . Pediatric Multiple Vit-C-FA (MULTIVITAMIN ANIMAL SHAPES, WITH CA/FA,) WITH C & FA CHEW Chew 1 tablet by mouth daily.    Marland Kitchen tretinoin (RETIN-A) 0.05 % cream     . atorvastatin (LIPITOR) 10 MG tablet Take 10 mg by mouth daily.      No facility-administered medications prior to visit.    ROS: Review of Systems  Constitutional: Negative for activity change, appetite change, chills, fatigue and unexpected weight change.  HENT: Negative for congestion, mouth sores and sinus pressure.   Eyes: Negative for visual disturbance.   Respiratory: Negative for cough and chest tightness.   Gastrointestinal: Negative for abdominal pain and nausea.  Genitourinary: Negative for difficulty urinating, frequency and vaginal pain.  Musculoskeletal: Negative for back pain and gait problem.  Skin: Negative for pallor and rash.  Neurological: Negative for dizziness, tremors, weakness, numbness and headaches.  Psychiatric/Behavioral: Negative for confusion and sleep disturbance.    Objective:  BP 130/68 (BP Location: Left Arm)   Pulse 65   Temp 98.3 F (36.8 C) (Oral)   Ht 5\' 1"  (1.549 m)   Wt 131 lb (59.4 kg)   SpO2 95%   BMI 24.75 kg/m   BP Readings from Last 3 Encounters:  04/25/20 130/68  12/22/19 130/62  09/29/19 118/68    Wt Readings from Last 3 Encounters:  04/25/20 131 lb (59.4 kg)  12/22/19 132 lb (59.9 kg)  09/29/19 130 lb 6.4 oz (59.1 kg)    Physical Exam Constitutional:      General: She is not in acute distress.    Appearance: She is well-developed.  HENT:     Head: Normocephalic.     Right Ear: External ear normal.     Left Ear: External ear normal.     Nose: Nose normal.  Eyes:     General:        Right eye: No discharge.  Left eye: No discharge.     Conjunctiva/sclera: Conjunctivae normal.     Pupils: Pupils are equal, round, and reactive to light.  Neck:     Thyroid: No thyromegaly.     Vascular: No JVD.     Trachea: No tracheal deviation.  Cardiovascular:     Rate and Rhythm: Normal rate and regular rhythm.     Heart sounds: Normal heart sounds.  Pulmonary:     Effort: No respiratory distress.     Breath sounds: No stridor. No wheezing.  Abdominal:     General: Bowel sounds are normal. There is no distension.     Palpations: Abdomen is soft. There is no mass.     Tenderness: There is no abdominal tenderness. There is no guarding or rebound.  Musculoskeletal:        General: No tenderness.     Cervical back: Normal range of motion and neck supple.  Lymphadenopathy:      Cervical: No cervical adenopathy.  Skin:    Findings: No erythema or rash.  Neurological:     Cranial Nerves: No cranial nerve deficit.     Motor: No abnormal muscle tone.     Coordination: Coordination normal.     Deep Tendon Reflexes: Reflexes normal.  Psychiatric:        Behavior: Behavior normal.        Thought Content: Thought content normal.        Judgment: Judgment normal.   mild bruit B  Lab Results  Component Value Date   WBC 4.8 04/24/2020   HGB 11.6 (L) 04/24/2020   HCT 34.1 (L) 04/24/2020   PLT 206.0 04/24/2020   GLUCOSE 91 04/24/2020   CHOL 189 12/21/2019   TRIG 98.0 12/21/2019   HDL 73.70 12/21/2019   LDLDIRECT 126.2 10/06/2012   LDLCALC 96 12/21/2019   ALT 17 12/21/2019   AST 17 12/21/2019   NA 141 04/24/2020   K 4.4 04/24/2020   CL 107 04/24/2020   CREATININE 1.22 (H) 04/24/2020   BUN 27 (H) 04/24/2020   CO2 25 04/24/2020   TSH 4.33 04/24/2020   INR 0.95 07/20/2010   HGBA1C 5.8 06/17/2018    CT CARDIAC SCORING  Addendum Date: 12/21/2017   ADDENDUM REPORT: 12/21/2017 12:24 CLINICAL DATA:  35F with hypertension and hyperlipidemia for risk stratification EXAM: Coronary Calcium Score TECHNIQUE: The patient was scanned on a [employer] scanner. Axial non-contrast 3 mm slices were carried out through the heart. The data set was analyzed on a dedicated work station and scored using the [place]. FINDINGS: Non-cardiac: See separate report from [facility]. Ascending Aorta: Normal in size.  No calcification. Pericardium: Normal Coronary arteries: Right dominant. Calcification noted in the proximal left circumflex. IMPRESSION: Coronary calcium score of 3.3. This was percentile 34th for age and sex matched control. Skeet Latch, MD Electronically Signed   By: Skeet Latch   On: 12/21/2017 12:24   Result Date: 12/21/2017 EXAM: OVER-READ INTERPRETATION  CT CHEST The following report is an over-read performed by radiologist Dr. Rolm Baptise of  [facility], [place] on 12/21/2017. This over-read does not include interpretation of cardiac or coronary anatomy or pathology. The coronary calcium score interpretation by the cardiologist is attached. COMPARISON:  None. FINDINGS: Vascular: Essential height crash that heart is normal size. Visualized aorta is normal caliber. Mediastinum/Nodes: No adenopathy in the lower mediastinum or hila. Small hiatal hernia. Lungs/Pleura: Visualized lungs clear.  No effusions. Upper Abdomen: Imaging into the upper abdomen shows no  acute findings. Musculoskeletal: Chest wall soft tissues are unremarkable. No acute bony abnormality. IMPRESSION: No acute or significant extracardiac abnormality. Electronically Signed: By: Rolm Baptise M.D. On: 12/21/2017 11:10    Assessment & Plan:    Walker Kehr, MD

## 2020-04-25 NOTE — Assessment & Plan Note (Signed)
Mild. Duplex US

## 2020-04-25 NOTE — Assessment & Plan Note (Signed)
Cont Benicar

## 2020-04-25 NOTE — Assessment & Plan Note (Signed)
Atrovent, Maxair prn Chronic Arnuity ellipta qd

## 2020-04-25 NOTE — Assessment & Plan Note (Addendum)
Use Epipen prn

## 2020-04-25 NOTE — Assessment & Plan Note (Signed)
GFR 44 Due to polycystic kidney disease and hypertension. Continue with good blood pressure control.  Good hydration.  Nephrology follow-up

## 2020-04-25 NOTE — Addendum Note (Signed)
Addended by: [name] on: 04/25/2020 02:09 PM   Modules accepted: Orders

## 2020-05-07 ENCOUNTER — Ambulatory Visit
Admission: RE | Admit: 2020-05-07 | Discharge: 2020-05-07 | Disposition: A | Discharge status: Home / Self Care | Payer: Medicare PPO | Source: Ambulatory Visit | Attending: Internal Medicine | Admitting: Internal Medicine

## 2020-05-07 DIAGNOSIS — R0989 Other specified symptoms and signs involving the circulatory and respiratory systems: Secondary | ICD-10-CM

## 2020-06-21 NOTE — Telephone Encounter (Signed)
REcomm that pt add Toprol daily   Can do 1/2 tab 2x per day   Switch to whole once per day if feeling better after 1 wk Need USN of carotid here given plaquing  Need tighter control of lipids   I would recomm actually switching to 10 mg   Crestor   It is more potent than lipitor   F/U lipids in 8 wks

## 2020-06-22 MED ORDER — METOPROLOL SUCCINATE ER 25 MG PO TB24: 25.0000 mg | ORAL_TABLET | Freq: Every day | ORAL | 3 refills | Status: DC

## 2020-06-22 NOTE — Telephone Encounter (Signed)
Pt aware to increase Toprol XL.  Will start at 1/2 tab BID and then change to a whole tablet daily.    Had [place] in April 2022.    Adv to change to Crestor.  Pt stated that nephrologist adv to use atorvastatin instead of Crestor.    Aware I will let Dr. Harrington Challenger know and then will adv further w her recommendations.

## 2020-06-25 NOTE — Telephone Encounter (Signed)
I am not sure why nephrogist is against Crestor   I have many pt s on it If taking lipitor would take 7 days per week   F/U with lipisd and AST in 8 wks

## 2020-06-28 NOTE — Telephone Encounter (Signed)
See pt adv request 06/28/20.

## 2020-06-28 NOTE — Telephone Encounter (Signed)
Called pt to let her know the following:   I am not sure why nephrogist is against Crestor   I have many pt s on it If taking lipitor would take 7 days per week   F/U with lipisd and AST in 8 wks       The patient stated she could not tolerate lipitor 7 days per week and that is why she was only taking MWF.   She said her nephrologist, Dr. Alger Simons at [facility] said atorvastatin is stronger and he recommended she increase to 4 times per week and in 2 weeks check lipids again.  She wishes they could talk so she doesn't have to go back and forth.  I adv her to follow the recommendations of one of the physicians only.  Adv of Dr. Alan Ripper rec again for Crestor 10 mg daily.  She will call back in 2 weeks after next lipids are checked and will decide what how she wants to continue at that point.

## 2020-07-04 NOTE — Telephone Encounter (Signed)
I would add Zetia to regimen   Her Calcium score is high   I would try to get LDL down further   She was at 80 Continue statin IN next lab check in 2 months do lipomed with lp(a) an Apo(B)

## 2020-07-10 ENCOUNTER — Other Ambulatory Visit: Payer: Self-pay | Admitting: Internal Medicine

## 2020-07-10 NOTE — Telephone Encounter (Signed)
Left message for the pt to call back:   I would add Zetia to regimen   Her Calcium score is high   I would try to get LDL down further   She was at 80 Continue statin IN next lab check in 2 months do lipomed with lp(a) an Apo(B)

## 2020-07-18 ENCOUNTER — Other Ambulatory Visit: Payer: Self-pay | Admitting: Internal Medicine

## 2020-07-21 ENCOUNTER — Other Ambulatory Visit: Payer: Self-pay | Admitting: Internal Medicine

## 2020-08-24 ENCOUNTER — Other Ambulatory Visit: Payer: Self-pay

## 2020-08-24 ENCOUNTER — Other Ambulatory Visit (INDEPENDENT_AMBULATORY_CARE_PROVIDER_SITE_OTHER): Payer: Medicare PPO

## 2020-08-24 DIAGNOSIS — J4521 Mild intermittent asthma with (acute) exacerbation: Secondary | ICD-10-CM | POA: Diagnosis not present

## 2020-08-24 DIAGNOSIS — R739 Hyperglycemia, unspecified: Secondary | ICD-10-CM

## 2020-08-24 DIAGNOSIS — I10 Essential (primary) hypertension: Secondary | ICD-10-CM | POA: Diagnosis not present

## 2020-08-24 DIAGNOSIS — R7989 Other specified abnormal findings of blood chemistry: Secondary | ICD-10-CM | POA: Diagnosis not present

## 2020-08-24 LAB — COMPREHENSIVE METABOLIC PANEL
ALT: 28 U/L (ref 0–35)
AST: 25 U/L (ref 0–37)
Albumin: 4.2 g/dL (ref 3.5–5.2)
Alkaline Phosphatase: 75 U/L (ref 39–117)
BUN: 31 mg/dL — ABNORMAL HIGH (ref 6–23)
CO2: 25 mEq/L (ref 19–32)
Calcium: 9.2 mg/dL (ref 8.4–10.5)
Chloride: 104 mEq/L (ref 96–112)
Creatinine, Ser: 1.28 mg/dL — ABNORMAL HIGH (ref 0.40–1.20)
GFR: 41.66 mL/min — ABNORMAL LOW (ref >60.00)
Glucose, Bld: 108 mg/dL — ABNORMAL HIGH (ref 70–99)
Potassium: 4.3 mEq/L (ref 3.5–5.1)
Sodium: 139 mEq/L (ref 135–145)
Total Bilirubin: 0.6 mg/dL (ref 0.2–1.2)
Total Protein: 7.1 g/dL (ref 6.0–8.3)

## 2020-08-24 LAB — T4, FREE: Free T4: 0.85 ng/dL (ref 0.60–1.60)

## 2020-08-24 LAB — CBC WITH DIFFERENTIAL/PLATELET
Basophils Absolute: 0.1 10*3/uL (ref 0.0–0.1)
Basophils Relative: 1 % (ref 0.0–3.0)
Eosinophils Absolute: 0.3 10*3/uL (ref 0.0–0.7)
Eosinophils Relative: 6.1 % — ABNORMAL HIGH (ref 0.0–5.0)
HCT: 34.4 % — ABNORMAL LOW (ref 36.0–46.0)
Hemoglobin: 11.5 g/dL — ABNORMAL LOW (ref 12.0–15.0)
Lymphocytes Relative: 43.3 % (ref 12.0–46.0)
Lymphs Abs: 2.3 10*3/uL (ref 0.7–4.0)
MCHC: 33.5 g/dL (ref 30.0–36.0)
MCV: 93.4 fl (ref 78.0–100.0)
Monocytes Absolute: 0.5 10*3/uL (ref 0.1–1.0)
Monocytes Relative: 10.1 % (ref 3.0–12.0)
Neutro Abs: 2.1 10*3/uL (ref 1.4–7.7)
Neutrophils Relative %: 39.5 % — ABNORMAL LOW (ref 43.0–77.0)
Platelets: 250 10*3/uL (ref 150.0–400.0)
RBC: 3.69 Mil/uL — ABNORMAL LOW (ref 3.87–5.11)
RDW: 13.2 % (ref 11.5–15.5)
WBC: 5.3 10*3/uL (ref 4.0–10.5)

## 2020-08-24 LAB — TSH: TSH: 6.87 u[IU]/mL — ABNORMAL HIGH (ref 0.35–5.50)

## 2020-08-24 LAB — T3, FREE: T3, Free: 3.7 pg/mL (ref 2.3–4.2)

## 2020-08-27 ENCOUNTER — Encounter: Payer: Self-pay | Admitting: Internal Medicine

## 2020-08-27 ENCOUNTER — Ambulatory Visit: Payer: Medicare PPO | Admitting: Internal Medicine

## 2020-08-27 ENCOUNTER — Other Ambulatory Visit: Payer: Self-pay

## 2020-08-27 VITALS — BP 120/60 | HR 75 | Temp 98.1°F | Ht 61.0 in | Wt 127.0 lb

## 2020-08-27 DIAGNOSIS — J4521 Mild intermittent asthma with (acute) exacerbation: Secondary | ICD-10-CM

## 2020-08-27 DIAGNOSIS — N281 Cyst of kidney, acquired: Secondary | ICD-10-CM

## 2020-08-27 DIAGNOSIS — I10 Essential (primary) hypertension: Secondary | ICD-10-CM | POA: Diagnosis not present

## 2020-08-27 DIAGNOSIS — Q613 Polycystic kidney, unspecified: Secondary | ICD-10-CM

## 2020-08-27 DIAGNOSIS — N2889 Other specified disorders of kidney and ureter: Secondary | ICD-10-CM

## 2020-08-27 DIAGNOSIS — Z0000 Encounter for general adult medical examination without abnormal findings: Secondary | ICD-10-CM

## 2020-08-27 DIAGNOSIS — N183 Chronic kidney disease, stage 3 unspecified: Secondary | ICD-10-CM | POA: Diagnosis not present

## 2020-08-27 DIAGNOSIS — E785 Hyperlipidemia, unspecified: Secondary | ICD-10-CM | POA: Diagnosis not present

## 2020-08-27 DIAGNOSIS — R5383 Other fatigue: Secondary | ICD-10-CM | POA: Diagnosis not present

## 2020-08-27 MED ORDER — LORATADINE 10 MG PO TABS
10.0000 mg | ORAL_TABLET | Freq: Every day | ORAL | 3 refills | Status: DC
Start: 2020-08-27 — End: 2021-05-13

## 2020-08-27 NOTE — Assessment & Plan Note (Signed)
Stable - continue w/Benicar

## 2020-08-27 NOTE — Assessment & Plan Note (Signed)
S/p nephrology consult - Dr Alger Simons - [place]

## 2020-08-27 NOTE — Assessment & Plan Note (Signed)
On Vascepa

## 2020-08-27 NOTE — Assessment & Plan Note (Addendum)
F/u w/Dr Jensen

## 2020-08-27 NOTE — Progress Notes (Signed)
Subjective:  Patient ID: [name], female    DOB: [birth year]  Age: 75 y.o. MRN: [id number]  CC: Follow-up   HPI [name] presents for dyslipidemia, HTN, CRF f/u New dog  - [place] - 66 wks old Zoe  Outpatient Medications Prior to Visit  Medication Sig Dispense Refill   ARNUITY ELLIPTA 100 MCG/ACT AEPB INHALE 1 PUFF BY MOUTH INTO THE LUNGS EVERY DAY 30 each 5   aspirin EC 81 MG tablet Take 81 mg by mouth daily.      Calcium Citrate (CITRACAL PO) Take 1 capsule by mouth daily.      cetirizine (ZYRTEC) 10 MG tablet Take 10 mg by mouth as needed for allergies.     Cholecalciferol (VITAMIN D3) 50 MCG (2000 UT) capsule Take 1 capsule (2,000 Units total) by mouth daily. 100 capsule 3   cyclobenzaprine (FLEXERIL) 10 MG tablet Take 10 mg by mouth 2 (two) times daily as needed.     EPINEPHrine (EPIPEN 2-PAK) 0.3 mg/0.3 mL IJ SOAJ injection As directed 1 each 3   estradiol (ESTRACE) 0.1 MG/GM vaginal cream Place 2 g vaginally 3 (three) times a week.     metoprolol succinate (TOPROL-XL) 25 MG 24 hr tablet TAKE 1/2 TABLET(12.5 MG) BY MOUTH DAILY AS NEEDED 45 tablet 1   olmesartan (BENICAR) 40 MG tablet Take 1 tablet (40 mg total) by mouth daily. 90 tablet 3   omega-3 acid ethyl esters (LOVAZA) 1 g capsule TAKE 2 CAPSULES BY MOUTH TWICE DAILY 120 capsule 11   Pediatric Multiple Vit-C-FA (MULTIVITAMIN ANIMAL SHAPES, WITH CA/FA,) WITH C & FA CHEW Chew 1 tablet by mouth daily.     tretinoin (RETIN-A) 0.05 % cream      atorvastatin (LIPITOR) 10 MG tablet Take 10 mg by mouth 4 (four) times a week.     No facility-administered medications prior to visit.    ROS: Review of Systems  Constitutional:  Positive for fatigue. Negative for activity change, appetite change, chills and unexpected weight change.  HENT:  Negative for congestion, mouth sores and sinus pressure.   Eyes:  Negative for visual disturbance.  Respiratory:  Negative for cough and chest tightness.   Gastrointestinal:   Negative for abdominal pain and nausea.  Genitourinary:  Negative for difficulty urinating, frequency and vaginal pain.  Musculoskeletal:  Negative for back pain and gait problem.  Skin:  Negative for pallor and rash.  Neurological:  Negative for dizziness, tremors, weakness, numbness and headaches.  Psychiatric/Behavioral:  Negative for confusion and sleep disturbance. The patient is nervous/anxious.    Objective:  BP 120/60 (BP Location: Left Arm)   Pulse 75   Temp 98.1 F (36.7 C) (Oral)   Ht '5\' 1"'$  (1.549 m)   Wt 127 lb (57.6 kg)   SpO2 97%   BMI 24.00 kg/m   BP Readings from Last 3 Encounters:  08/27/20 120/60  04/25/20 130/68  12/22/19 130/62    Wt Readings from Last 3 Encounters:  08/27/20 127 lb (57.6 kg)  04/25/20 131 lb (59.4 kg)  12/22/19 132 lb (59.9 kg)    Physical Exam Constitutional:      General: She is not in acute distress.    Appearance: She is well-developed.  HENT:     Head: Normocephalic.     Right Ear: External ear normal.     Left Ear: External ear normal.     Nose: Nose normal.  Eyes:     General:  Right eye: No discharge.        Left eye: No discharge.     Conjunctiva/sclera: Conjunctivae normal.     Pupils: Pupils are equal, round, and reactive to light.  Neck:     Thyroid: No thyromegaly.     Vascular: No JVD.     Trachea: No tracheal deviation.  Cardiovascular:     Rate and Rhythm: Normal rate and regular rhythm.     Heart sounds: Normal heart sounds.  Pulmonary:     Effort: No respiratory distress.     Breath sounds: No stridor. No wheezing.  Abdominal:     General: Bowel sounds are normal. There is no distension.     Palpations: Abdomen is soft. There is no mass.     Tenderness: There is no abdominal tenderness. There is no guarding or rebound.  Musculoskeletal:        General: No tenderness.     Cervical back: Normal range of motion and neck supple. No rigidity.  Lymphadenopathy:     Cervical: No cervical adenopathy.   Skin:    Findings: No erythema or rash.  Neurological:     Cranial Nerves: No cranial nerve deficit.     Motor: No abnormal muscle tone.     Coordination: Coordination normal.     Deep Tendon Reflexes: Reflexes normal.  Psychiatric:        Behavior: Behavior normal.        Thought Content: Thought content normal.        Judgment: Judgment normal.    Lab Results  Component Value Date   WBC 5.3 08/24/2020   HGB 11.5 (L) 08/24/2020   HCT 34.4 (L) 08/24/2020   PLT 250.0 08/24/2020   GLUCOSE 108 (H) 08/24/2020   CHOL 189 12/21/2019   TRIG 98.0 12/21/2019   HDL 73.70 12/21/2019   LDLDIRECT 126.2 10/06/2012   LDLCALC 96 12/21/2019   ALT 28 08/24/2020   AST 25 08/24/2020   NA 139 08/24/2020   K 4.3 08/24/2020   CL 104 08/24/2020   CREATININE 1.28 (H) 08/24/2020   BUN 31 (H) 08/24/2020   CO2 25 08/24/2020   TSH 6.87 (H) 08/24/2020   INR 0.95 07/20/2010   HGBA1C 5.8 06/17/2018    US Carotid Bilateral  Result Date: 05/07/2020 CLINICAL DATA:  Bilateral asymptomatic bruit. Hypertension, hyperlipidemia EXAM: BILATERAL CAROTID DUPLEX ULTRASOUND TECHNIQUE: Pearline Cables scale imaging, color Doppler and duplex ultrasound were performed of bilateral carotid and vertebral arteries in the neck. COMPARISON:  None. FINDINGS: Criteria: Quantification of carotid stenosis is based on velocity parameters that correlate the residual internal carotid diameter with NASCET-based stenosis levels, using the diameter of the distal internal carotid lumen as the denominator for stenosis measurement. The following velocity measurements were obtained: RIGHT ICA: 104/23 cm/sec CCA: [id number] cm/sec SYSTOLIC ICA/CCA RATIO:  0.8 ECA: 65 cm/sec LEFT ICA: 111/39 cm/sec CCA: [id number] cm/sec SYSTOLIC ICA/CCA RATIO:  0.8 ECA: 79 cm/sec RIGHT CAROTID ARTERY: Mild smooth eccentric plaque in the bulb without significant stenosis. Normal waveforms and color Doppler signal. RIGHT VERTEBRAL ARTERY:  Normal flow direction and waveform.  LEFT CAROTID ARTERY: Mild eccentric smooth noncalcified plaque at the carotid bifurcation with only mild stenosis. Normal waveforms and color Doppler signal. LEFT VERTEBRAL ARTERY:  Normal flow direction and waveform. Upper extremity blood pressures: RIGHT: 157/78 LEFT: 150/66 IMPRESSION: 1. 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 2. Antegrade bilateral vertebral arterial flow. Electronically Signed   By: Eden Emms.D.  On: 05/07/2020 11:45    Assessment & Plan:     Walker Kehr, MD

## 2020-08-27 NOTE — Assessment & Plan Note (Signed)
Doing well 

## 2020-08-27 NOTE — Assessment & Plan Note (Addendum)
Otto is wondering if she is allergic to currently dog.  Advised to take Claritin daily Continue with Arnuity Ellipta

## 2020-08-29 DIAGNOSIS — I10 Essential (primary) hypertension: Secondary | ICD-10-CM | POA: Diagnosis not present

## 2020-08-29 DIAGNOSIS — E782 Mixed hyperlipidemia: Secondary | ICD-10-CM | POA: Diagnosis not present

## 2020-08-29 DIAGNOSIS — D638 Anemia in other chronic diseases classified elsewhere: Secondary | ICD-10-CM | POA: Diagnosis not present

## 2020-09-06 DIAGNOSIS — D638 Anemia in other chronic diseases classified elsewhere: Secondary | ICD-10-CM | POA: Diagnosis not present

## 2020-09-06 DIAGNOSIS — Q613 Polycystic kidney, unspecified: Secondary | ICD-10-CM | POA: Diagnosis not present

## 2020-09-06 DIAGNOSIS — I10 Essential (primary) hypertension: Secondary | ICD-10-CM | POA: Diagnosis not present

## 2020-11-08 ENCOUNTER — Ambulatory Visit: Payer: Medicare PPO | Admitting: Internal Medicine

## 2020-11-08 ENCOUNTER — Other Ambulatory Visit: Payer: Self-pay

## 2020-11-08 ENCOUNTER — Encounter: Payer: Self-pay | Admitting: Internal Medicine

## 2020-11-08 VITALS — BP 110/62 | HR 84 | Ht 60.0 in | Wt 125.2 lb

## 2020-11-08 DIAGNOSIS — R002 Palpitations: Secondary | ICD-10-CM

## 2020-11-08 MED ORDER — DILTIAZEM HCL 30 MG PO TABS: ORAL_TABLET | ORAL | 1 refills | Status: DC

## 2020-11-08 NOTE — Patient Instructions (Signed)
Medication Instructions:  Start Diltiazem 30 mg as needed   *If you need a refill on your cardiac medications before your next appointment, please call your pharmacy*   Lab Work: none If you have labs (blood work) drawn today and your tests are completely normal, you will receive your results only by: [place] (if you have MyChart) OR A paper copy in the mail If you have any lab test that is abnormal or we need to change your treatment, we will call you to review the results.   Testing/Procedures: none   Follow-Up: At [facility], you and your health needs are our priority.  As part of our continuing mission to provide you with exceptional heart care, we have created designated Provider Care Teams.  These Care Teams include your primary Cardiologist (physician) and Advanced Practice Providers (APPs -  Physician Assistants and Nurse Practitioners) who all work together to provide you with the care you need, when you need it.  We recommend signing up for the patient portal called "MyChart".  Sign up information is provided on this After Visit Summary.  MyChart is used to connect with patients for Virtual Visits (Telemedicine).  Patients are able to view lab/test results, encounter notes, upcoming appointments, etc.  Non-urgent messages can be sent to your provider as well.   To learn more about what you can do with MyChart, go to [email or web address].    Your next appointment:   1 year(s)  The format for your next appointment:   In Person  Provider:   [name], MD   Other Instructions
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
## 2020-11-25 ENCOUNTER — Encounter (HOSPITAL_COMMUNITY): Payer: Self-pay | Admitting: Emergency Medicine

## 2020-11-25 ENCOUNTER — Emergency Department (HOSPITAL_COMMUNITY)
Admission: EM | Admit: 2020-11-25 | Discharge: 2020-11-25 | Disposition: A | Discharge status: Home / Self Care | Payer: Medicare PPO | Attending: Emergency Medicine | Admitting: Emergency Medicine

## 2020-11-25 DIAGNOSIS — Z20822 Contact with and (suspected) exposure to covid-19: Secondary | ICD-10-CM | POA: Diagnosis not present

## 2020-11-25 DIAGNOSIS — Z7982 Long term (current) use of aspirin: Secondary | ICD-10-CM | POA: Insufficient documentation

## 2020-11-25 DIAGNOSIS — J3489 Other specified disorders of nose and nasal sinuses: Secondary | ICD-10-CM | POA: Diagnosis not present

## 2020-11-25 DIAGNOSIS — Z7951 Long term (current) use of inhaled steroids: Secondary | ICD-10-CM | POA: Insufficient documentation

## 2020-11-25 DIAGNOSIS — J45909 Unspecified asthma, uncomplicated: Secondary | ICD-10-CM | POA: Diagnosis not present

## 2020-11-25 DIAGNOSIS — U071 COVID-19: Secondary | ICD-10-CM | POA: Diagnosis not present

## 2020-11-25 DIAGNOSIS — N183 Chronic kidney disease, stage 3 unspecified: Secondary | ICD-10-CM | POA: Insufficient documentation

## 2020-11-25 DIAGNOSIS — I129 Hypertensive chronic kidney disease with stage 1 through stage 4 chronic kidney disease, or unspecified chronic kidney disease: Secondary | ICD-10-CM | POA: Insufficient documentation

## 2020-11-25 DIAGNOSIS — Z79899 Other long term (current) drug therapy: Secondary | ICD-10-CM | POA: Diagnosis not present

## 2020-11-25 DIAGNOSIS — R059 Cough, unspecified: Secondary | ICD-10-CM | POA: Diagnosis present

## 2020-11-25 MED ORDER — IPRATROPIUM BROMIDE HFA 17 MCG/ACT IN AERS
2.0000 | INHALATION_SPRAY | Freq: Once | RESPIRATORY_TRACT | Status: AC
  Administered 2020-11-25: 2 via RESPIRATORY_TRACT
  Filled 2020-11-25: qty 12.9

## 2020-11-25 MED ORDER — PREDNISONE 20 MG PO TABS: 40.0000 mg | ORAL_TABLET | Freq: Every day | ORAL | 0 refills | Status: DC

## 2020-11-25 MED ORDER — PREDNISONE 20 MG PO TABS
40.0000 mg | ORAL_TABLET | Freq: Once | ORAL | Status: AC
  Administered 2020-11-25: 40 mg via ORAL
  Filled 2020-11-25: qty 2

## 2020-11-25 MED ORDER — NIRMATRELVIR/RITONAVIR (PAXLOVID) TABLET (RENAL DOSING)
2.0000 | ORAL_TABLET | Freq: Two times a day (BID) | ORAL | Status: DC
Start: 2020-11-25 — End: 2020-11-25
  Administered 2020-11-25: 2 via ORAL
  Filled 2020-11-25: qty 20

## 2020-11-25 NOTE — ED Triage Notes (Signed)
Patient reports Covid exposure x 5 days ago. Symptomatic starting yesterday with cough, congestion, HA. Positive this morning at home. Hx asthma and bronchitis. Has not used rescue inhaler.

## 2020-11-25 NOTE — Discharge Instructions (Addendum)
Start the steroids tomorrow.  Use your rescue inhaler as needed.  Start the COVID medication.  If you start having severe shortness of breath, trouble keeping your medication down, high fever passing out return to the emergency room.  The pharmacist evaluated your chart and said you are a candidate for reduced dose paxlovid.
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
## 2020-11-26 ENCOUNTER — Telehealth: Payer: Self-pay | Admitting: Internal Medicine

## 2020-11-26 ENCOUNTER — Telehealth (HOSPITAL_COMMUNITY): Payer: Self-pay | Admitting: Emergency Medicine

## 2020-11-26 MED ORDER — PREDNISONE 20 MG PO TABS: 40.0000 mg | ORAL_TABLET | Freq: Every day | ORAL | 0 refills | Status: DC

## 2020-11-26 NOTE — Telephone Encounter (Signed)
Noted. Thanks.

## 2020-11-26 NOTE — Telephone Encounter (Signed)
Pt called in, hx asthma, recent ED visit for uri/covid, states lost rx prednisone (chart references uri symptoms exacerbating asthma), and is requesting new rx.  New rx sent to pharmacy.

## 2020-11-26 NOTE — Telephone Encounter (Signed)
Pt. Connected to [facility] 11.13.2022.  caller is covid +. pt has a headache, sore throat, congestion, wheezing. hx of asthma. does not have a rescue inhaler.   Pt. Was advised to go to ED. Pt. Understood and complied.

## 2020-11-28 ENCOUNTER — Other Ambulatory Visit: Payer: Self-pay | Admitting: Internal Medicine

## 2020-11-28 MED ORDER — ATROVENT HFA 17 MCG/ACT IN AERS: 2.0000 | INHALATION_SPRAY | RESPIRATORY_TRACT | 5 refills | Status: AC | PRN

## 2020-11-28 NOTE — Progress Notes (Signed)
Atrovent

## 2020-12-27 ENCOUNTER — Other Ambulatory Visit: Payer: Self-pay | Admitting: Internal Medicine

## 2021-01-21 ENCOUNTER — Other Ambulatory Visit: Payer: Self-pay

## 2021-01-21 ENCOUNTER — Ambulatory Visit (INDEPENDENT_AMBULATORY_CARE_PROVIDER_SITE_OTHER): Payer: Medicare PPO

## 2021-01-21 DIAGNOSIS — Z0000 Encounter for general adult medical examination without abnormal findings: Secondary | ICD-10-CM
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
## 2021-01-30 DIAGNOSIS — Z803 Family history of malignant neoplasm of breast: Secondary | ICD-10-CM | POA: Diagnosis not present

## 2021-01-30 DIAGNOSIS — Z1231 Encounter for screening mammogram for malignant neoplasm of breast: Secondary | ICD-10-CM | POA: Diagnosis not present

## 2021-01-30 DIAGNOSIS — R3915 Urgency of urination: Secondary | ICD-10-CM | POA: Diagnosis not present

## 2021-01-30 DIAGNOSIS — N8189 Other female genital prolapse: Secondary | ICD-10-CM | POA: Diagnosis not present

## 2021-01-30 DIAGNOSIS — N393 Stress incontinence (female) (male): Secondary | ICD-10-CM | POA: Diagnosis not present

## 2021-01-30 DIAGNOSIS — N952 Postmenopausal atrophic vaginitis: Secondary | ICD-10-CM | POA: Diagnosis not present

## 2021-01-30 DIAGNOSIS — M85852 Other specified disorders of bone density and structure, left thigh: Secondary | ICD-10-CM | POA: Diagnosis not present

## 2021-02-14 DIAGNOSIS — D225 Melanocytic nevi of trunk: Secondary | ICD-10-CM | POA: Diagnosis not present

## 2021-02-14 DIAGNOSIS — L814 Other melanin hyperpigmentation: Secondary | ICD-10-CM | POA: Diagnosis not present

## 2021-02-14 DIAGNOSIS — D2239 Melanocytic nevi of other parts of face: Secondary | ICD-10-CM | POA: Diagnosis not present

## 2021-02-14 DIAGNOSIS — D2261 Melanocytic nevi of right upper limb, including shoulder: Secondary | ICD-10-CM | POA: Diagnosis not present

## 2021-02-14 DIAGNOSIS — L821 Other seborrheic keratosis: Secondary | ICD-10-CM | POA: Diagnosis not present

## 2021-02-14 DIAGNOSIS — L820 Inflamed seborrheic keratosis: Secondary | ICD-10-CM | POA: Diagnosis not present

## 2021-02-14 DIAGNOSIS — Z85828 Personal history of other malignant neoplasm of skin: Secondary | ICD-10-CM | POA: Diagnosis not present

## 2021-02-14 DIAGNOSIS — L84 Corns and callosities: Secondary | ICD-10-CM | POA: Diagnosis not present

## 2021-02-14 DIAGNOSIS — I788 Other diseases of capillaries: Secondary | ICD-10-CM | POA: Diagnosis not present

## 2021-02-26 ENCOUNTER — Other Ambulatory Visit: Payer: Self-pay

## 2021-02-26 ENCOUNTER — Other Ambulatory Visit (INDEPENDENT_AMBULATORY_CARE_PROVIDER_SITE_OTHER): Payer: Medicare PPO

## 2021-02-26 DIAGNOSIS — Z0000 Encounter for general adult medical examination without abnormal findings: Secondary | ICD-10-CM | POA: Diagnosis not present

## 2021-02-26 DIAGNOSIS — E785 Hyperlipidemia, unspecified: Secondary | ICD-10-CM | POA: Diagnosis not present

## 2021-02-26 LAB — URINALYSIS
Bilirubin Urine: NEGATIVE
Hgb urine dipstick: NEGATIVE
Ketones, ur: NEGATIVE
Leukocytes,Ua: NEGATIVE
Nitrite: NEGATIVE
Specific Gravity, Urine: 1.015 (ref 1.000–1.030)
Total Protein, Urine: NEGATIVE
Urine Glucose: NEGATIVE
Urobilinogen, UA: 0.2 (ref 0.0–1.0)
pH: 7 (ref 5.0–8.0)

## 2021-02-26 LAB — CBC WITH DIFFERENTIAL/PLATELET
Basophils Absolute: 0.1 10*3/uL (ref 0.0–0.1)
Basophils Relative: 1.1 % (ref 0.0–3.0)
Eosinophils Absolute: 0.3 10*3/uL (ref 0.0–0.7)
Eosinophils Relative: 6.1 % — ABNORMAL HIGH (ref 0.0–5.0)
HCT: 35 % — ABNORMAL LOW (ref 36.0–46.0)
Hemoglobin: 11.6 g/dL — ABNORMAL LOW (ref 12.0–15.0)
Lymphocytes Relative: 53 % — ABNORMAL HIGH (ref 12.0–46.0)
Lymphs Abs: 2.9 10*3/uL (ref 0.7–4.0)
MCHC: 33 g/dL (ref 30.0–36.0)
MCV: 92.5 fl (ref 78.0–100.0)
Monocytes Absolute: 0.5 10*3/uL (ref 0.1–1.0)
Monocytes Relative: 9.7 % (ref 3.0–12.0)
Neutro Abs: 1.6 10*3/uL (ref 1.4–7.7)
Neutrophils Relative %: 30.1 % — ABNORMAL LOW (ref 43.0–77.0)
Platelets: 215 10*3/uL (ref 150.0–400.0)
RBC: 3.79 Mil/uL — ABNORMAL LOW (ref 3.87–5.11)
RDW: 12.9 % (ref 11.5–15.5)
WBC: 5.4 10*3/uL (ref 4.0–10.5)

## 2021-02-26 LAB — T4, FREE: Free T4: 0.65 ng/dL (ref 0.60–1.60)

## 2021-02-26 LAB — TSH: TSH: 5.66 u[IU]/mL — ABNORMAL HIGH (ref 0.35–5.50)

## 2021-02-27 ENCOUNTER — Encounter: Payer: Self-pay | Admitting: Internal Medicine

## 2021-02-27 ENCOUNTER — Ambulatory Visit: Payer: Medicare PPO | Admitting: Internal Medicine

## 2021-02-27 VITALS — BP 120/68 | HR 73 | Temp 98.1°F | Ht 60.0 in | Wt 128.0 lb

## 2021-02-27 DIAGNOSIS — Z1211 Encounter for screening for malignant neoplasm of colon: Secondary | ICD-10-CM

## 2021-02-27 DIAGNOSIS — I10 Essential (primary) hypertension: Secondary | ICD-10-CM

## 2021-02-27 DIAGNOSIS — N183 Chronic kidney disease, stage 3 unspecified: Secondary | ICD-10-CM | POA: Diagnosis not present

## 2021-02-27 DIAGNOSIS — R5383 Other fatigue: Secondary | ICD-10-CM | POA: Diagnosis not present

## 2021-02-27 DIAGNOSIS — E785 Hyperlipidemia, unspecified: Secondary | ICD-10-CM

## 2021-02-27 DIAGNOSIS — J4521 Mild intermittent asthma with (acute) exacerbation: Secondary | ICD-10-CM

## 2021-02-27 DIAGNOSIS — Q613 Polycystic kidney, unspecified: Secondary | ICD-10-CM

## 2021-02-27 DIAGNOSIS — D649 Anemia, unspecified: Secondary | ICD-10-CM

## 2021-02-27 DIAGNOSIS — Z8616 Personal history of COVID-19: Secondary | ICD-10-CM | POA: Diagnosis not present

## 2021-02-27 DIAGNOSIS — N2889 Other specified disorders of kidney and ureter: Secondary | ICD-10-CM

## 2021-02-27 LAB — COMPREHENSIVE METABOLIC PANEL
ALT: 15 U/L (ref 0–35)
AST: 20 U/L (ref 0–37)
Albumin: 4.2 g/dL (ref 3.5–5.2)
Alkaline Phosphatase: 41 U/L (ref 39–117)
BUN: 28 mg/dL — ABNORMAL HIGH (ref 6–23)
CO2: 29 mEq/L (ref 19–32)
Calcium: 9.6 mg/dL (ref 8.4–10.5)
Chloride: 104 mEq/L (ref 96–112)
Creatinine, Ser: 1.28 mg/dL — ABNORMAL HIGH (ref 0.40–1.20)
GFR: 41.51 mL/min — ABNORMAL LOW (ref >60.00)
Glucose, Bld: 106 mg/dL — ABNORMAL HIGH (ref 70–99)
Potassium: 4.5 mEq/L (ref 3.5–5.1)
Sodium: 140 mEq/L (ref 135–145)
Total Bilirubin: 0.5 mg/dL (ref 0.2–1.2)
Total Protein: 6.9 g/dL (ref 6.0–8.3)

## 2021-02-27 LAB — LIPID PANEL
Cholesterol: 195 mg/dL (ref 0–200)
HDL: 90.8 mg/dL (ref >39.00)
LDL Cholesterol: 87 mg/dL (ref 0–99)
NonHDL: 104.1
Total CHOL/HDL Ratio: 2
Triglycerides: 85 mg/dL (ref 0.0–149.0)
VLDL: 17 mg/dL (ref 0.0–40.0)

## 2021-02-27 NOTE — Assessment & Plan Note (Signed)
Monitoring GFR 

## 2021-02-27 NOTE — Assessment & Plan Note (Signed)
Cont Arnuity ellipta qd

## 2021-02-27 NOTE — Assessment & Plan Note (Signed)
Stable

## 2021-02-27 NOTE — Progress Notes (Signed)
Subjective:  Patient ID: [name], female    DOB: 01-07-1946  Age: 76 y.o. MRN: [id number]  CC: No chief complaint on file.   HPI TEZRA MAHR presents for asthma, HTN, abn TSH  Outpatient Medications Prior to Visit  Medication Sig Dispense Refill   ARNUITY ELLIPTA 100 MCG/ACT AEPB INHALE 1 PUFF BY MOUTH INTO THE LUNGS EVERY DAY 30 each 5   aspirin EC 81 MG tablet Take 81 mg by mouth daily.      Calcium Citrate (CITRACAL PO) Take 1 capsule by mouth daily.      cetirizine (ZYRTEC) 10 MG tablet Take 10 mg by mouth as needed for allergies.     Cholecalciferol (VITAMIN D3) 50 MCG (2000 UT) capsule Take 1 capsule (2,000 Units total) by mouth daily. 100 capsule 3   cyclobenzaprine (FLEXERIL) 10 MG tablet Take 10 mg by mouth 2 (two) times daily as needed.     diltiazem (CARDIZEM) 30 MG tablet Take one Tablet (30 mg) as needed for palpitations. 30 tablet 1   EPINEPHrine (EPIPEN 2-PAK) 0.3 mg/0.3 mL IJ SOAJ injection As directed 1 each 3   ipratropium (ATROVENT HFA) 17 MCG/ACT inhaler Inhale 2 puffs into the lungs every 4 (four) hours as needed for wheezing. 1 each 5   loratadine (CLARITIN) 10 MG tablet Take 1 tablet (10 mg total) by mouth daily. 100 tablet 3   olmesartan (BENICAR) 40 MG tablet TAKE 1 TABLET(40 MG) BY MOUTH DAILY 90 tablet 2   omega-3 acid ethyl esters (LOVAZA) 1 g capsule TAKE 2 CAPSULES BY MOUTH TWICE DAILY 120 capsule 11   Pediatric Multiple Vit-C-FA (MULTIVITAMIN ANIMAL SHAPES, WITH CA/FA,) WITH C & FA CHEW Chew 1 tablet by mouth daily.     tretinoin (RETIN-A) 0.05 % cream      atorvastatin (LIPITOR) 10 MG tablet Take 10 mg by mouth 4 (four) times a week.     No facility-administered medications prior to visit.    ROS: Review of Systems  Constitutional:  Negative for activity change, appetite change, chills, fatigue and unexpected weight change.  HENT:  Negative for congestion, mouth sores and sinus pressure.   Eyes:  Negative for visual disturbance.   Respiratory:  Negative for cough and chest tightness.   Gastrointestinal:  Negative for abdominal pain and nausea.  Genitourinary:  Negative for difficulty urinating, frequency and vaginal pain.  Musculoskeletal:  Negative for back pain and gait problem.  Skin:  Negative for pallor and rash.  Neurological:  Negative for dizziness, tremors, weakness, numbness and headaches.  Psychiatric/Behavioral:  Negative for confusion and sleep disturbance.    Objective:  BP 120/68 (BP Location: Left Arm, Patient Position: Sitting, Cuff Size: Large)    Pulse 73    Temp 98.1 F (36.7 C) (Oral)    Ht 5' (1.524 m)    Wt 128 lb (58.1 kg)    SpO2 99%    BMI 25.00 kg/m   BP Readings from Last 3 Encounters:  02/27/21 120/68  11/25/20 132/63  11/08/20 110/62    Wt Readings from Last 3 Encounters:  02/27/21 128 lb (58.1 kg)  11/25/20 123 lb (55.8 kg)  11/08/20 125 lb 3.2 oz (56.8 kg)    Physical Exam Constitutional:      General: [name] is not in acute distress.    Appearance: [name] is well-developed.  HENT:     Head: Normocephalic.     Right Ear: External ear normal.     Left Ear: External ear  normal.     Nose: Nose normal.  Eyes:     General:        Right eye: No discharge.        Left eye: No discharge.     Conjunctiva/sclera: Conjunctivae normal.     Pupils: Pupils are equal, round, and reactive to light.  Neck:     Thyroid: No thyromegaly.     Vascular: No JVD.     Trachea: No tracheal deviation.  Cardiovascular:     Rate and Rhythm: Normal rate and regular rhythm.     Heart sounds: Normal heart sounds.  Pulmonary:     Effort: No respiratory distress.     Breath sounds: No stridor. No wheezing.  Abdominal:     General: Bowel sounds are normal. There is no distension.     Palpations: Abdomen is soft. There is no mass.     Tenderness: There is no abdominal tenderness. There is no guarding or rebound.  Musculoskeletal:        General: No tenderness.     Cervical back: Normal range of  motion and neck supple. No rigidity.  Lymphadenopathy:     Cervical: No cervical adenopathy.  Skin:    Findings: No erythema or rash.  Neurological:     Cranial Nerves: No cranial nerve deficit.     Motor: No abnormal muscle tone.     Coordination: Coordination normal.     Deep Tendon Reflexes: Reflexes normal.  Psychiatric:        Behavior: Behavior normal.        Thought Content: Thought content normal.        Judgment: Judgment normal.    Lab Results  Component Value Date   WBC 5.4 02/26/2021   HGB 11.6 (L) 02/26/2021   HCT 35.0 (L) 02/26/2021   PLT 215.0 02/26/2021   GLUCOSE 106 (H) 02/26/2021   CHOL 195 02/26/2021   TRIG 85.0 02/26/2021   HDL 90.80 02/26/2021   LDLDIRECT 126.2 10/06/2012   LDLCALC 87 02/26/2021   ALT 15 02/26/2021   AST 20 02/26/2021   NA 140 02/26/2021   K 4.5 02/26/2021   CL 104 02/26/2021   CREATININE 1.28 (H) 02/26/2021   BUN 28 (H) 02/26/2021   CO2 29 02/26/2021   TSH 5.66 (H) 02/26/2021   INR 0.95 07/20/2010   HGBA1C 5.8 06/17/2018    No results found.  Assessment & Plan:   Problem List Items Addressed This Visit     Anemia, mild    Stable      Relevant Orders   CBC with Differential/Platelet   Asthma    Cont Arnuity ellipta qd      Chronic renal insufficiency, stage 3 (moderate) (HCC)    Stable GFR 44 Due to polycystic kidney disease and hypertension. Continue with good blood pressure control.  Good hydration.  Nephrology follow-up      Dyslipidemia   Relevant Orders   TSH   Lipid panel   Comprehensive metabolic panel   T4, free   Fatigue   Relevant Orders   Urinalysis   CBC with Differential/Platelet   T4, free   History of COVID-19    Post-COVID fatigue - resolved      Hypertension - Primary   Polycystic kidney disease    Monitoring GFR      Other Visit Diagnoses     Colon cancer screening       Relevant Orders   Ambulatory referral to Gastroenterology  No orders of the defined types  were placed in this encounter.     Follow-up: Return in about 6 months (around 08/27/2021) for Wellness Exam.  Walker Kehr, MD

## 2021-02-27 NOTE — Assessment & Plan Note (Signed)
Post-COVID fatigue - resolved

## 2021-02-27 NOTE — Assessment & Plan Note (Signed)
Stable GFR 44 Due to polycystic kidney disease and hypertension. Continue with good blood pressure control.  Good hydration.  Nephrology follow-up

## 2021-03-06 DIAGNOSIS — I10 Essential (primary) hypertension: Secondary | ICD-10-CM | POA: Diagnosis not present

## 2021-03-06 DIAGNOSIS — Q613 Polycystic kidney, unspecified: Secondary | ICD-10-CM | POA: Diagnosis not present

## 2021-03-07 ENCOUNTER — Encounter: Payer: Self-pay | Admitting: Internal Medicine

## 2021-03-07 DIAGNOSIS — I10 Essential (primary) hypertension: Secondary | ICD-10-CM | POA: Diagnosis not present

## 2021-03-07 DIAGNOSIS — Q613 Polycystic kidney, unspecified: Secondary | ICD-10-CM | POA: Diagnosis not present

## 2021-03-07 DIAGNOSIS — D638 Anemia in other chronic diseases classified elsewhere: Secondary | ICD-10-CM | POA: Diagnosis not present

## 2021-03-11 DIAGNOSIS — H40053 Ocular hypertension, bilateral: Secondary | ICD-10-CM | POA: Diagnosis not present

## 2021-03-11 DIAGNOSIS — H04123 Dry eye syndrome of bilateral lacrimal glands: Secondary | ICD-10-CM | POA: Diagnosis not present

## 2021-03-14 ENCOUNTER — Other Ambulatory Visit: Payer: Self-pay | Admitting: Internal Medicine

## 2021-03-20 ENCOUNTER — Telehealth: Payer: Self-pay | Admitting: Gastroenterology

## 2021-03-20 ENCOUNTER — Encounter: Payer: Self-pay | Admitting: Gastroenterology

## 2021-03-20 NOTE — Telephone Encounter (Signed)
Good morning Dr. Bryan Lemma & Dr. Tarri Glenn, ? ?This patient was a former patient of Dr. Nichola Sizer and she is due for a colonoscopy.  However, she is asking for a switch to Dr. Tarri Glenn, as she prefers a female doctor.  She has actually never seen either of you.  Please let me know if you both approve of the switch so that I may get her scheduled. ? ?Thank you. ?

## 2021-04-07 ENCOUNTER — Other Ambulatory Visit: Payer: Self-pay | Admitting: Internal Medicine

## 2021-04-22 ENCOUNTER — Ambulatory Visit (AMBULATORY_SURGERY_CENTER): Payer: Medicare PPO

## 2021-04-22 VITALS — Ht 60.0 in | Wt 128.0 lb

## 2021-04-22 DIAGNOSIS — Z1211 Encounter for screening for malignant neoplasm of colon: Secondary | ICD-10-CM

## 2021-04-22 NOTE — Progress Notes (Signed)
No egg or soy allergy known to patient  ?No issues known to pt with past sedation with any surgeries or procedures ?Patient denies ever being told they had issues or difficulty with intubation  ?No FH of Malignant Hyperthermia ?Pt is not on diet pills ?Pt is not on  home 02  ?Pt is not on blood thinners  ?Pt denies issues with constipation  ?No A fib or A flutter  ? ? ?PV completed over the phone. Pt verified name, DOB, address and insurance during PV today.  ?Pt mailed instruction packet with copy of consent form to read and not return, and instructions.  ?Pt encouraged to call with questions or issues.  ?If pt has My chart, procedure instructions sent via My Chart  ? ?

## 2021-05-02 ENCOUNTER — Encounter: Payer: Self-pay | Admitting: Gastroenterology

## 2021-05-03 DIAGNOSIS — S63509A Unspecified sprain of unspecified wrist, initial encounter: Secondary | ICD-10-CM

## 2021-05-03 HISTORY — DX: Unspecified sprain of unspecified wrist, initial encounter: S63.509A

## 2021-05-07 DIAGNOSIS — W010XXA Fall on same level from slipping, tripping and stumbling without subsequent striking against object, initial encounter: Secondary | ICD-10-CM | POA: Diagnosis not present

## 2021-05-07 DIAGNOSIS — M79641 Pain in right hand: Secondary | ICD-10-CM | POA: Diagnosis not present

## 2021-05-07 DIAGNOSIS — Y93K1 Activity, walking an animal: Secondary | ICD-10-CM | POA: Diagnosis not present

## 2021-05-07 DIAGNOSIS — S63501A Unspecified sprain of right wrist, initial encounter: Secondary | ICD-10-CM | POA: Diagnosis not present

## 2021-05-07 DIAGNOSIS — M1811 Unilateral primary osteoarthritis of first carpometacarpal joint, right hand: Secondary | ICD-10-CM | POA: Diagnosis not present

## 2021-05-13 ENCOUNTER — Encounter: Payer: Self-pay | Admitting: Gastroenterology

## 2021-05-13 ENCOUNTER — Ambulatory Visit (AMBULATORY_SURGERY_CENTER): Payer: Medicare PPO | Admitting: Gastroenterology

## 2021-05-13 VITALS — BP 115/60 | HR 57 | Temp 98.0°F | Resp 13 | Ht 60.0 in | Wt 128.0 lb

## 2021-05-13 DIAGNOSIS — D123 Benign neoplasm of transverse colon: Secondary | ICD-10-CM | POA: Diagnosis not present

## 2021-05-13 DIAGNOSIS — D122 Benign neoplasm of ascending colon: Secondary | ICD-10-CM

## 2021-05-13 DIAGNOSIS — Z1211 Encounter for screening for malignant neoplasm of colon: Secondary | ICD-10-CM

## 2021-05-13 MED ORDER — SODIUM CHLORIDE 0.9 % IV SOLN: 500.0000 mL | Freq: Once | INTRAVENOUS | Status: DC

## 2021-05-13 NOTE — Progress Notes (Signed)
Pt c/o allergy to "all opiods", states they maker her nauseous, inform pt propofol is not an opiod, Kassie Mends, CRNA speaks with pt and assured her he would give her zofran prior to the procedure to prevent nausea, pt verb understanding and feeling better about the medication and nausea potential. ? ?Pt had fallen last Monday and has a Right wrist sprain, state she had xrays and no fracture. ?

## 2021-05-13 NOTE — Progress Notes (Signed)
PT taken to PACU. Monitors in place. VSS. Report given to RN. 

## 2021-05-13 NOTE — Op Note (Signed)
[place] ?Patient Name: [name] ?Procedure Date: 05/13/2021 1:05 PM ?MRN: [id number] ?Endoscopist: Thornton Park MD, MD ?Age: 76 ?Referring MD:  ?Date of Birth: 1946-01-10 ?Gender: Female ?Account #: [id number] ?Procedure:                Colonoscopy ?Indications:              Screening for colorectal malignant neoplasm ?                          No polyps on colonoscopy with Dr. Olevia Perches in 2012 ?Medicines:                Monitored Anesthesia Care ?Procedure:                Pre-Anesthesia Assessment: ?                          - Prior to the procedure, a History and Physical  ?                          was performed, and patient medications and  ?                          allergies were reviewed. The patient's tolerance of  ?                          previous anesthesia was also reviewed. The risks  ?                          and benefits of the procedure and the sedation  ?                          options and risks were discussed with the patient.  ?                          All questions were answered, and informed consent  ?                          was obtained. Prior Anticoagulants: The patient has  ?                          taken no previous anticoagulant or antiplatelet  ?                          agents. ASA Grade Assessment: III - A patient with  ?                          severe systemic disease. After reviewing the risks  ?                          and benefits, the patient was deemed in  ?                          satisfactory condition to undergo the procedure. ?  After obtaining informed consent, the colonoscope  ?                          was passed under direct vision. Throughout the  ?                          procedure, the patient's blood pressure, pulse, and  ?                          oxygen saturations were monitored continuously. The  ?                          Olympus CF-HQ190L ([id number]) Colonoscope was  ?                          introduced through  the anus and advanced to the 3  ?                          cm into the ileum. A second forward view of the  ?                          right colon was performed. The colonoscopy was  ?                          performed without difficulty. The patient tolerated  ?                          the procedure well. The quality of the bowel  ?                          preparation was good. The terminal ileum, ileocecal  ?                          valve, appendiceal orifice, and rectum were  ?                          photographed. ?Scope In: 1:54:06 PM ?Scope Out: 2:08:47 PM ?Scope Withdrawal Time: 0 hours 10 minutes 4 seconds  ?Total Procedure Duration: 0 hours 14 minutes 41 seconds  ?Findings:                 The perianal and digital rectal examinations were  ?                          normal. ?                          Many small and large-mouthed diverticula were found  ?                          in the sigmoid colon and descending colon. ?                          A 2 mm polyp was found in the transverse colon. The  ?  polyp was sessile. The polyp was removed with a  ?                          cold snare. Resection and retrieval were complete.  ?                          Estimated blood loss was minimal. ?                          A 3 mm polyp was found in the hepatic flexure. The  ?                          polyp was sessile. The polyp was removed with a  ?                          cold snare. Resection and retrieval were complete.  ?                          Estimated blood loss was minimal. ?                          A 2 mm polyp was found in the ascending colon. The  ?                          polyp was sessile. The polyp was removed with a  ?                          cold snare. Resection and retrieval were complete.  ?                          Estimated blood loss was minimal. ?                          The exam was otherwise without abnormality on  ?                          direct and  retroflexion views. ?Complications:            No immediate complications. ?Estimated Blood Loss:     Estimated blood loss was minimal. ?Impression:               - Diverticulosis in the sigmoid colon and in the  ?                          descending colon. ?                          - One 2 mm polyp in the transverse colon, removed  ?                          with a cold snare. Resected and retrieved. ?                          - One 3 mm polyp at  the hepatic flexure, removed  ?                          with a cold snare. Resected and retrieved. ?                          - One 2 mm polyp in the ascending colon, removed  ?                          with a cold snare. Resected and retrieved. ?                          - The examination was otherwise normal on direct  ?                          and retroflexion views. ?Recommendation:           - Patient has a contact number available for  ?                          emergencies. The signs and symptoms of potential  ?                          delayed complications were discussed with the  ?                          patient. Return to normal activities tomorrow.  ?                          Written discharge instructions were provided to the  ?                          patient. ?                          - High fiber diet. ?                          - Continue present medications. ?                          - Await pathology results. ?                          - Repeat colonoscopy date to be determined after  ?                          pending pathology results are reviewed for  ?                          surveillance. Plan colonoscopy in 3 years if all 3  ?                          polyps are adenomas. ?                          -  Emerging evidence supports eating a diet of  ?                          fruits, vegetables, grains, calcium, and yogurt  ?                          while reducing red meat and alcohol may reduce the  ?                          risk of colon  cancer. ?                          - Thank you for allowing me to be involved in your  ?                          colon cancer prevention. ?Thornton Park MD, MD ?05/13/2021 2:16:18 PM ?This report has been signed electronically. ?
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
## 2021-05-13 NOTE — Patient Instructions (Signed)
YOU HAD AN ENDOSCOPIC PROCEDURE TODAY AT THE [facility]:   Refer to the procedure report that was given to you for any specific questions about what was found during the examination.  If the procedure report does not answer your questions, please call your gastroenterologist to clarify.  If you requested that your care partner not be given the details of your procedure findings, then the procedure report has been included in a sealed envelope for you to review at your convenience later.  YOU SHOULD EXPECT: Some feelings of bloating in the abdomen. Passage of more gas than usual.  Walking can help get rid of the air that was put into your GI tract during the procedure and reduce the bloating. If you had a lower endoscopy (such as a colonoscopy or flexible sigmoidoscopy) you may notice spotting of blood in your stool or on the toilet paper. If you underwent a bowel prep for your procedure, you may not have a normal bowel movement for a few days.  Please Note:  You might notice some irritation and congestion in your nose or some drainage.  This is from the oxygen used during your procedure.  There is no need for concern and it should clear up in a day or so.  SYMPTOMS TO REPORT IMMEDIATELY:   Following lower endoscopy (colonoscopy or flexible sigmoidoscopy):  Excessive amounts of blood in the stool  Significant tenderness or worsening of abdominal pains  Swelling of the abdomen that is new, acute  Fever of 100F or higher  For urgent or emergent issues, a gastroenterologist can be reached at any hour by calling [phone]. Do not use MyChart messaging for urgent concerns.    DIET:  We do recommend a small meal at first, but then you may proceed to your regular diet.  Drink plenty of fluids but you should avoid alcoholic beverages for 24 hours.  ACTIVITY:  You should plan to take it easy for the rest of today and you should NOT DRIVE or use heavy machinery until tomorrow (because  of the sedation medicines used during the test).    FOLLOW UP: Our staff will call the number listed on your records 48-72 hours following your procedure to check on you and address any questions or concerns that you may have regarding the information given to you following your procedure. If we do not reach you, we will leave a message.  We will attempt to reach you two times.  During this call, we will ask if you have developed any symptoms of COVID 19. If you develop any symptoms (ie: fever, flu-like symptoms, shortness of breath, cough etc.) before then, please call [phone].  If you test positive for Covid 19 in the 2 weeks post procedure, please call and report this information to us.    If any biopsies were taken you will be contacted by phone or by letter within the next 1-3 weeks.  Please call us at [phone] if you have not heard about the biopsies in 3 weeks.    SIGNATURES/CONFIDENTIALITY: You and/or your care partner have signed paperwork which will be entered into your electronic medical record.  These signatures attest to the fact that that the information above on your After Visit Summary has been reviewed and is understood.  Full responsibility of the confidentiality of this discharge information lies with you and/or your care-partner. 

## 2021-05-15 ENCOUNTER — Telehealth: Payer: Self-pay

## 2021-05-15 NOTE — Telephone Encounter (Signed)
?  Follow up Call- ? ? ?  05/13/2021  ? 12:52 PM  ?Call back number  ?Post procedure Call Back phone  # [phone]  ?Permission to leave phone message Yes  ?  ? ?Patient questions: ? ?Do you have a fever, pain , or abdominal swelling? No. ?Pain Score  0 * ? ?Have you tolerated food without any problems? Yes.   ? ?Have you been able to return to your normal activities? Yes.   ? ?Do you have any questions about your discharge instructions: ?Diet   No. ?Medications  No. ?Follow up visit  No. ? ?Do you have questions or concerns about your Care? No. ? ?Actions: ?* If pain score is 4 or above: ?No action needed, pain <4. ? ? ?

## 2021-05-16 ENCOUNTER — Encounter: Payer: Self-pay | Admitting: Gastroenterology

## 2021-05-28 DIAGNOSIS — M19041 Primary osteoarthritis, right hand: Secondary | ICD-10-CM | POA: Diagnosis not present

## 2021-05-28 DIAGNOSIS — M65321 Trigger finger, right index finger: Secondary | ICD-10-CM | POA: Diagnosis not present

## 2021-06-04 ENCOUNTER — Other Ambulatory Visit: Payer: Self-pay | Admitting: Internal Medicine

## 2021-06-05 ENCOUNTER — Encounter: Payer: Self-pay | Admitting: Internal Medicine

## 2021-06-06 ENCOUNTER — Other Ambulatory Visit: Payer: Self-pay | Admitting: Internal Medicine

## 2021-06-06 MED ORDER — METHYLPREDNISOLONE 4 MG PO TBPK: ORAL_TABLET | ORAL | 0 refills | Status: DC

## 2021-06-06 MED ORDER — AZITHROMYCIN 250 MG PO TABS: ORAL_TABLET | ORAL | 0 refills | Status: DC

## 2021-06-13 ENCOUNTER — Other Ambulatory Visit: Payer: Self-pay | Admitting: Internal Medicine

## 2021-06-13 ENCOUNTER — Encounter: Payer: Self-pay | Admitting: Internal Medicine

## 2021-06-13 MED ORDER — ATORVASTATIN CALCIUM 10 MG PO TABS
10.0000 mg | ORAL_TABLET | ORAL | 1 refills | Status: DC
Start: 2021-06-13 — End: 2021-06-13

## 2021-06-21 ENCOUNTER — Other Ambulatory Visit: Payer: Self-pay | Admitting: Internal Medicine

## 2021-07-13 ENCOUNTER — Other Ambulatory Visit: Payer: Self-pay | Admitting: Internal Medicine

## 2021-07-15 MED ORDER — OMEGA-3-ACID ETHYL ESTERS 1 G PO CAPS: 2.0000 | ORAL_CAPSULE | Freq: Two times a day (BID) | ORAL | 1 refills | Status: DC

## 2021-07-15 NOTE — Addendum Note (Signed)
Addended by: Earnstine Regal on: 07/15/2021 08:40 AM   Modules accepted: Orders

## 2021-08-28 ENCOUNTER — Encounter: Payer: Self-pay | Admitting: Internal Medicine

## 2021-08-28 DIAGNOSIS — E785 Hyperlipidemia, unspecified: Secondary | ICD-10-CM

## 2021-08-28 DIAGNOSIS — Z79899 Other long term (current) drug therapy: Secondary | ICD-10-CM

## 2021-09-02 ENCOUNTER — Other Ambulatory Visit (INDEPENDENT_AMBULATORY_CARE_PROVIDER_SITE_OTHER): Payer: Medicare PPO

## 2021-09-02 DIAGNOSIS — E785 Hyperlipidemia, unspecified: Secondary | ICD-10-CM

## 2021-09-02 DIAGNOSIS — R5383 Other fatigue: Secondary | ICD-10-CM

## 2021-09-02 DIAGNOSIS — D649 Anemia, unspecified: Secondary | ICD-10-CM

## 2021-09-02 LAB — CBC WITH DIFFERENTIAL/PLATELET
Basophils Absolute: 0.1 10*3/uL (ref 0.0–0.1)
Basophils Relative: 1 % (ref 0.0–3.0)
Eosinophils Absolute: 0.5 10*3/uL (ref 0.0–0.7)
Eosinophils Relative: 9.3 % — ABNORMAL HIGH (ref 0.0–5.0)
HCT: 34.6 % — ABNORMAL LOW (ref 36.0–46.0)
Hemoglobin: 11.8 g/dL — ABNORMAL LOW (ref 12.0–15.0)
Lymphocytes Relative: 53.8 % — ABNORMAL HIGH (ref 12.0–46.0)
Lymphs Abs: 2.9 10*3/uL (ref 0.7–4.0)
MCHC: 34.1 g/dL (ref 30.0–36.0)
MCV: 93 fl (ref 78.0–100.0)
Monocytes Absolute: 0.5 10*3/uL (ref 0.1–1.0)
Monocytes Relative: 9.3 % (ref 3.0–12.0)
Neutro Abs: 1.4 10*3/uL (ref 1.4–7.7)
Neutrophils Relative %: 26.6 % — ABNORMAL LOW (ref 43.0–77.0)
Platelets: 231 10*3/uL (ref 150.0–400.0)
RBC: 3.72 Mil/uL — ABNORMAL LOW (ref 3.87–5.11)
RDW: 12.5 % (ref 11.5–15.5)
WBC: 5.3 10*3/uL (ref 4.0–10.5)

## 2021-09-02 LAB — URINALYSIS
Bilirubin Urine: NEGATIVE
Hgb urine dipstick: NEGATIVE
Leukocytes,Ua: NEGATIVE
Nitrite: NEGATIVE
Specific Gravity, Urine: 1.015 (ref 1.000–1.030)
Total Protein, Urine: NEGATIVE
Urine Glucose: NEGATIVE
Urobilinogen, UA: 0.2 (ref 0.0–1.0)
pH: 6.5 (ref 5.0–8.0)

## 2021-09-02 LAB — TSH: TSH: 5 u[IU]/mL (ref 0.35–5.50)

## 2021-09-02 LAB — COMPREHENSIVE METABOLIC PANEL
ALT: 21 U/L (ref 0–35)
AST: 22 U/L (ref 0–37)
Albumin: 4.1 g/dL (ref 3.5–5.2)
Alkaline Phosphatase: 42 U/L (ref 39–117)
BUN: 32 mg/dL — ABNORMAL HIGH (ref 6–23)
CO2: 28 mEq/L (ref 19–32)
Calcium: 9.4 mg/dL (ref 8.4–10.5)
Chloride: 104 mEq/L (ref 96–112)
Creatinine, Ser: 1.28 mg/dL — ABNORMAL HIGH (ref 0.40–1.20)
GFR: 41.36 mL/min — ABNORMAL LOW (ref >60.00)
Glucose, Bld: 111 mg/dL — ABNORMAL HIGH (ref 70–99)
Potassium: 4.4 mEq/L (ref 3.5–5.1)
Sodium: 141 mEq/L (ref 135–145)
Total Bilirubin: 0.6 mg/dL (ref 0.2–1.2)
Total Protein: 6.8 g/dL (ref 6.0–8.3)

## 2021-09-02 LAB — LIPID PANEL
Cholesterol: 207 mg/dL — ABNORMAL HIGH (ref 0–200)
HDL: 88.3 mg/dL (ref >39.00)
LDL Cholesterol: 103 mg/dL — ABNORMAL HIGH (ref 0–99)
NonHDL: 118.27
Total CHOL/HDL Ratio: 2
Triglycerides: 75 mg/dL (ref 0.0–149.0)
VLDL: 15 mg/dL (ref 0.0–40.0)

## 2021-09-02 LAB — T4, FREE: Free T4: 0.69 ng/dL (ref 0.60–1.60)

## 2021-09-03 NOTE — Telephone Encounter (Signed)
Please set up for lipomed and Lpa and Apo B as well as liver panel at her cnvenience.    I will see her in November which is about  1 year since last visit

## 2021-09-04 ENCOUNTER — Encounter: Payer: Self-pay | Admitting: Internal Medicine

## 2021-09-04 ENCOUNTER — Ambulatory Visit: Payer: Medicare PPO | Admitting: Internal Medicine

## 2021-09-04 VITALS — BP 132/58 | HR 65 | Temp 98.1°F | Ht 60.0 in | Wt 125.4 lb

## 2021-09-04 DIAGNOSIS — R002 Palpitations: Secondary | ICD-10-CM

## 2021-09-04 DIAGNOSIS — D649 Anemia, unspecified: Secondary | ICD-10-CM

## 2021-09-04 DIAGNOSIS — Z23 Encounter for immunization: Secondary | ICD-10-CM | POA: Diagnosis not present

## 2021-09-04 DIAGNOSIS — J4521 Mild intermittent asthma with (acute) exacerbation: Secondary | ICD-10-CM

## 2021-09-04 DIAGNOSIS — D126 Benign neoplasm of colon, unspecified: Secondary | ICD-10-CM | POA: Diagnosis not present

## 2021-09-04 DIAGNOSIS — N183 Chronic kidney disease, stage 3 unspecified: Secondary | ICD-10-CM

## 2021-09-04 DIAGNOSIS — K635 Polyp of colon: Secondary | ICD-10-CM | POA: Insufficient documentation

## 2021-09-04 MED ORDER — OLMESARTAN MEDOXOMIL 40 MG PO TABS
40.0000 mg | ORAL_TABLET | Freq: Every day | ORAL | 3 refills | Status: DC
Start: 2021-09-04 — End: 2022-03-10

## 2021-09-04 NOTE — Assessment & Plan Note (Signed)
[name] is wondering if she is allergic to her dog.  Advised to take Claritin daily Continue with Arnuity Ellipta

## 2021-09-04 NOTE — Progress Notes (Signed)
Subjective:  Patient ID: [name], female    DOB: 1945-07-21  Age: 76 y.o. MRN: [id number]  CC: No chief complaint on file.   HPI [name] presents for asthma, HTN, CRI  Outpatient Medications Prior to Visit  Medication Sig Dispense Refill   ARNUITY ELLIPTA 100 MCG/ACT AEPB INHALE 1 PUFF BY MOUTH EVERY DAY 30 each 5   aspirin EC 81 MG tablet Take 81 mg by mouth daily.      atorvastatin (LIPITOR) 10 MG tablet TAKE 1 TABLET(10 MG) BY MOUTH 4 TIMES A WEEK 90 tablet 1   Calcium Citrate (CITRACAL PO) Take 1 capsule by mouth daily.      cetirizine (ZYRTEC) 10 MG tablet Take 10 mg by mouth as needed for allergies.     Cholecalciferol (VITAMIN D3) 50 MCG (2000 UT) capsule Take 1 capsule (2,000 Units total) by mouth daily. 100 capsule 3   cyclobenzaprine (FLEXERIL) 10 MG tablet Take 10 mg by mouth 2 (two) times daily as needed.     EPINEPHrine 0.3 mg/0.3 mL IJ SOAJ injection USE AS DIRECTED 2 each 0   estradiol (ESTRACE) 0.1 MG/GM vaginal cream Place vaginally.     ipratropium (ATROVENT HFA) 17 MCG/ACT inhaler Inhale 2 puffs into the lungs every 4 (four) hours as needed for wheezing. 1 each 5   olmesartan (BENICAR) 40 MG tablet Take 1 tablet (40 mg total) by mouth daily. Annual appt due in August must see provider for future refills 90 tablet 0   omega-3 acid ethyl esters (LOVAZA) 1 g capsule Take 2 capsules (2 g total) by mouth 2 (two) times daily. Annual appt due in Aug must see provider for future refills 120 capsule 1   Pediatric Multiple Vit-C-FA (MULTIVITAMIN ANIMAL SHAPES, WITH CA/FA,) WITH C & FA CHEW Chew 1 tablet by mouth daily.     tretinoin (RETIN-A) 0.05 % cream      azithromycin (ZITHROMAX Z-PAK) 250 MG tablet As directed (Patient not taking: Reported on 09/04/2021) 6 tablet 0   methylPREDNISolone (MEDROL DOSEPAK) 4 MG TBPK tablet As directed (Patient not taking: Reported on 09/04/2021) 21 tablet 0   No facility-administered medications prior to visit.    ROS: Review of  Systems  Constitutional:  Negative for activity change, appetite change, chills, fatigue and unexpected weight change.  HENT:  Negative for congestion, mouth sores and sinus pressure.   Eyes:  Negative for visual disturbance.  Respiratory:  Negative for cough and chest tightness.   Gastrointestinal:  Negative for abdominal pain and nausea.  Genitourinary:  Negative for difficulty urinating, frequency and vaginal pain.  Musculoskeletal:  Negative for back pain and gait problem.  Skin:  Negative for pallor and rash.  Neurological:  Negative for dizziness, tremors, weakness, numbness and headaches.  Psychiatric/Behavioral:  Negative for confusion and sleep disturbance.     Objective:  BP (!) 132/58 (BP Location: Left Arm)   Pulse 65   Temp 98.1 F (36.7 C) (Oral)   Ht 5' (1.524 m)   Wt 125 lb 6.4 oz (56.9 kg)   SpO2 96%   BMI 24.49 kg/m   BP Readings from Last 3 Encounters:  09/04/21 (!) 132/58  05/13/21 115/60  02/27/21 120/68    Wt Readings from Last 3 Encounters:  09/04/21 125 lb 6.4 oz (56.9 kg)  05/13/21 128 lb (58.1 kg)  04/22/21 128 lb (58.1 kg)    Physical Exam Constitutional:      General: She is not in acute distress.  Appearance: Normal appearance. She is well-developed.  HENT:     Head: Normocephalic.     Right Ear: External ear normal.     Left Ear: External ear normal.     Nose: Nose normal.  Eyes:     General:        Right eye: No discharge.        Left eye: No discharge.     Conjunctiva/sclera: Conjunctivae normal.     Pupils: Pupils are equal, round, and reactive to light.  Neck:     Thyroid: No thyromegaly.     Vascular: No JVD.     Trachea: No tracheal deviation.  Cardiovascular:     Rate and Rhythm: Normal rate and regular rhythm.     Heart sounds: Normal heart sounds.  Pulmonary:     Effort: No respiratory distress.     Breath sounds: No stridor. No wheezing.  Abdominal:     General: Bowel sounds are normal. There is no distension.      Palpations: Abdomen is soft. There is no mass.     Tenderness: There is no abdominal tenderness. There is no guarding or rebound.  Musculoskeletal:        General: No tenderness.     Cervical back: Normal range of motion and neck supple. No rigidity.  Lymphadenopathy:     Cervical: No cervical adenopathy.  Skin:    Findings: No erythema or rash.  Neurological:     Cranial Nerves: No cranial nerve deficit.     Motor: No abnormal muscle tone.     Coordination: Coordination normal.     Deep Tendon Reflexes: Reflexes normal.  Psychiatric:        Behavior: Behavior normal.        Thought Content: Thought content normal.        Judgment: Judgment normal.     Lab Results  Component Value Date   WBC 5.3 09/02/2021   HGB 11.8 (L) 09/02/2021   HCT 34.6 (L) 09/02/2021   PLT 231.0 09/02/2021   GLUCOSE 111 (H) 09/02/2021   CHOL 207 (H) 09/02/2021   TRIG 75.0 09/02/2021   HDL 88.30 09/02/2021   LDLDIRECT 126.2 10/06/2012   LDLCALC 103 (H) 09/02/2021   ALT 21 09/02/2021   AST 22 09/02/2021   NA 141 09/02/2021   K 4.4 09/02/2021   CL 104 09/02/2021   CREATININE 1.28 (H) 09/02/2021   BUN 32 (H) 09/02/2021   CO2 28 09/02/2021   TSH 5.00 09/02/2021   INR 0.95 07/20/2010   HGBA1C 5.8 06/17/2018    No results found.  Assessment & Plan:   Problem List Items Addressed This Visit     Anemia, normocytic normochromic    Due to CRI Iron tests - nl      Relevant Orders   T4, free   TSH   Comprehensive metabolic panel   Hemoglobin A1c   Iron, TIBC and Ferritin Panel   CBC with Differential/Platelet   Asthma     [name] is wondering if she is allergic to her dog.  Advised to take Claritin daily Continue with Arnuity Ellipta      Chronic renal insufficiency, stage 3 (moderate) (HCC)    GFR 44 Due to polycystic kidney disease and hypertension. Continue with good blood pressure control.  Good hydration.  Nephrology follow-up      Relevant Orders   T4, free   TSH    Comprehensive metabolic panel   Hemoglobin A1c   Iron, TIBC  and Ferritin Panel   Colon polyps    Dr Tarri Glenn Colon 2023 - 3 polyps, TA x2 Repeat colon in 3-5 years      Palpitations    Doing well      Relevant Orders   T4, free   TSH   Comprehensive metabolic panel   Hemoglobin A1c   Iron, TIBC and Ferritin Panel      No orders of the defined types were placed in this encounter.     Follow-up: No follow-ups on file.  Walker Kehr, MD

## 2021-09-04 NOTE — Telephone Encounter (Signed)
I spoke with the pt and she will have labs 11/05/21... I also moved up her appt with Dr Harrington Challenger to 11/19/21 per her request.

## 2021-09-04 NOTE — Progress Notes (Signed)
132 [id number] 

## 2021-09-04 NOTE — Assessment & Plan Note (Signed)
Dr Tarri Glenn Colon 2023 - 3 polyps, TA x2 Repeat colon in 3-5 years

## 2021-09-04 NOTE — Assessment & Plan Note (Addendum)
GFR 44 Due to polycystic kidney disease and hypertension. Continue with good blood pressure control.  Good hydration.  Nephrology follow-up - ask about [place]

## 2021-09-04 NOTE — Assessment & Plan Note (Signed)
Due to CRI Iron tests - nl

## 2021-09-04 NOTE — Assessment & Plan Note (Signed)
Doing well 

## 2021-09-04 NOTE — Addendum Note (Signed)
Addended by: Earnstine Regal on: 09/04/2021 02:11 PM   Modules accepted: Orders

## 2021-09-06 DIAGNOSIS — Q613 Polycystic kidney, unspecified: Secondary | ICD-10-CM | POA: Diagnosis not present

## 2021-09-06 DIAGNOSIS — I10 Essential (primary) hypertension: Secondary | ICD-10-CM | POA: Diagnosis not present

## 2021-09-06 DIAGNOSIS — D638 Anemia in other chronic diseases classified elsewhere: Secondary | ICD-10-CM | POA: Diagnosis not present

## 2021-09-11 DIAGNOSIS — N1831 Chronic kidney disease, stage 3a: Secondary | ICD-10-CM | POA: Diagnosis not present

## 2021-09-11 DIAGNOSIS — Q613 Polycystic kidney, unspecified: Secondary | ICD-10-CM | POA: Diagnosis not present

## 2021-09-11 DIAGNOSIS — D638 Anemia in other chronic diseases classified elsewhere: Secondary | ICD-10-CM | POA: Diagnosis not present

## 2021-09-11 DIAGNOSIS — I10 Essential (primary) hypertension: Secondary | ICD-10-CM | POA: Diagnosis not present

## 2021-10-27 ENCOUNTER — Encounter: Payer: Self-pay | Admitting: Internal Medicine

## 2021-10-28 ENCOUNTER — Other Ambulatory Visit (HOSPITAL_BASED_OUTPATIENT_CLINIC_OR_DEPARTMENT_OTHER): Payer: Self-pay

## 2021-10-28 MED ORDER — OMEGA-3-ACID ETHYL ESTERS 1 G PO CAPS: 2.0000 | ORAL_CAPSULE | Freq: Two times a day (BID) | ORAL | 11 refills | Status: DC

## 2021-10-28 MED ORDER — COVID-19 MRNA 2023-2024 VACCINE (COMIRNATY) 0.3 ML INJECTION
INTRAMUSCULAR | 0 refills | Status: DC
  Filled 2021-10-28: qty 0.3, 1d supply, fill #0

## 2021-10-28 NOTE — Addendum Note (Signed)
Addended by: Earnstine Regal on: 10/28/2021 10:01 AM   Modules accepted: Orders

## 2021-10-31 ENCOUNTER — Other Ambulatory Visit: Payer: Self-pay | Admitting: Internal Medicine

## 2021-11-05 ENCOUNTER — Ambulatory Visit: Payer: Medicare PPO | Attending: Internal Medicine

## 2021-11-05 DIAGNOSIS — Z79899 Other long term (current) drug therapy: Secondary | ICD-10-CM

## 2021-11-05 DIAGNOSIS — E785 Hyperlipidemia, unspecified: Secondary | ICD-10-CM | POA: Diagnosis not present

## 2021-11-06 LAB — HEPATIC FUNCTION PANEL
ALT: 20 IU/L (ref 0–32)
AST: 19 IU/L (ref 0–40)
Albumin: 4.3 g/dL (ref 3.8–4.8)
Alkaline Phosphatase: 51 IU/L (ref 44–121)
Bilirubin Total: 0.3 mg/dL (ref 0.0–1.2)
Bilirubin, Direct: 0.1 mg/dL (ref 0.00–0.40)
Total Protein: 6.8 g/dL (ref 6.0–8.5)

## 2021-11-06 LAB — NMR, LIPOPROFILE
Cholesterol, Total: 200 mg/dL — ABNORMAL HIGH (ref 100–199)
HDL Particle Number: 40.7 umol/L (ref >=30.5)
HDL-C: 92 mg/dL (ref >39)
LDL Particle Number: 1120 nmol/L — ABNORMAL HIGH (ref <1000)
LDL Size: 21.4 nm (ref >20.5)
LDL-C (NIH Calc): 93 mg/dL (ref 0–99)
LP-IR Score: <25 (ref <=45)
Small LDL Particle Number: <90 nmol/L (ref <=527)
Triglycerides: 82 mg/dL (ref 0–149)

## 2021-11-06 LAB — LIPOPROTEIN A (LPA): Lipoprotein (a): 12.7 nmol/L (ref <75.0)

## 2021-11-06 LAB — APOLIPOPROTEIN B: Apolipoprotein B: 79 mg/dL (ref <90)

## 2021-11-11 ENCOUNTER — Encounter: Payer: Self-pay | Admitting: Internal Medicine

## 2021-11-11 NOTE — Telephone Encounter (Signed)
Response from Dr Harrington Challenger lab note:   Patient's LDL is 93    It has been better   Is she taking lipitor 4x per week as she used to?  Has diet changed   Would like to get back down  .Marland KitchenMarland Kitchen

## 2021-11-12 NOTE — Telephone Encounter (Signed)
Dr Harrington Challenger,   Do you want her to take Atorvastatin '10mg'$  daily instead of just 3x per week?   Thank You,   Mariea Stable, Carmin Muskrat, MD to Thora Lance, RN      11/12/21  3:47 PM yes

## 2021-11-13 MED ORDER — ATORVASTATIN CALCIUM 10 MG PO TABS: 10.0000 mg | ORAL_TABLET | Freq: Every day | ORAL | 3 refills | Status: DC

## 2021-11-19 ENCOUNTER — Ambulatory Visit: Payer: Medicare PPO | Attending: Internal Medicine | Admitting: Internal Medicine

## 2021-11-19 ENCOUNTER — Encounter: Payer: Self-pay | Admitting: Internal Medicine

## 2021-11-19 VITALS — BP 110/70 | HR 68 | Ht 61.0 in | Wt 129.4 lb

## 2021-11-19 DIAGNOSIS — E785 Hyperlipidemia, unspecified: Secondary | ICD-10-CM

## 2021-11-19 DIAGNOSIS — Z79899 Other long term (current) drug therapy: Secondary | ICD-10-CM
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
## 2021-11-19 NOTE — Patient Instructions (Addendum)
Medication Instructions:  Restart Lipitor 10 mg at night   *If you need a refill on your cardiac medications before your next appointment, please call your pharmacy*   Lab Work: NMR in 8 weeks FASTING   If you have labs (blood work) drawn today and your tests are completely normal, you will receive your results only by: [place] (if you have MyChart) OR A paper copy in the mail If you have any lab test that is abnormal or we need to change your treatment, we will call you to review the results.   Testing/Procedures:    Follow-Up: At [facility], you and your health needs are our priority.  As part of our continuing mission to provide you with exceptional heart care, we have created designated Provider Care Teams.  These Care Teams include your primary Cardiologist (physician) and Advanced Practice Providers (APPs -  Physician Assistants and Nurse Practitioners) who all work together to provide you with the care you need, when you need it.  We recommend signing up for the patient portal called "MyChart".  Sign up information is provided on this After Visit Summary.  MyChart is used to connect with patients for Virtual Visits (Telemedicine).  Patients are able to view lab/test results, encounter notes, upcoming appointments, etc.  Non-urgent messages can be sent to your provider as well.   To learn more about what you can do with MyChart, go to [email or web address].    Your next appointment:   1 year(s)  The format for your next appointment:   In Person  Provider:   [name], MD     Other Instructions   Important Information About Sugar

## 2021-12-12 ENCOUNTER — Ambulatory Visit: Payer: Medicare PPO | Admitting: Internal Medicine

## 2021-12-30 ENCOUNTER — Telehealth: Payer: Self-pay | Admitting: Internal Medicine

## 2021-12-30 NOTE — Telephone Encounter (Signed)
Left message for patient to call back to schedule Medicare Annual Wellness Visit   Last AWV  01/21/21  Please schedule at anytime with LB Moclips if patient calls the office back.      Any questions, please call me at [phone]

## 2022-01-17 ENCOUNTER — Encounter: Payer: Self-pay | Admitting: Internal Medicine

## 2022-01-20 ENCOUNTER — Ambulatory Visit: Payer: Medicare PPO | Attending: Internal Medicine

## 2022-01-20 DIAGNOSIS — Z79899 Other long term (current) drug therapy: Secondary | ICD-10-CM

## 2022-01-20 DIAGNOSIS — E785 Hyperlipidemia, unspecified: Secondary | ICD-10-CM

## 2022-01-21 DIAGNOSIS — L720 Epidermal cyst: Secondary | ICD-10-CM | POA: Diagnosis not present

## 2022-01-21 DIAGNOSIS — Z85828 Personal history of other malignant neoplasm of skin: Secondary | ICD-10-CM | POA: Diagnosis not present

## 2022-01-22 LAB — NMR, LIPOPROFILE
Cholesterol, Total: 195 mg/dL (ref 100–199)
HDL Particle Number: 42.1 umol/L (ref >=30.5)
HDL-C: 91 mg/dL (ref >39)
LDL Particle Number: 992 nmol/L (ref <1000)
LDL Size: 20.7 nm (ref >20.5)
LDL-C (NIH Calc): 83 mg/dL (ref 0–99)
LP-IR Score: <25 (ref <=45)
Small LDL Particle Number: 357 nmol/L (ref <=527)
Triglycerides: 122 mg/dL (ref 0–149)

## 2022-01-23 ENCOUNTER — Encounter: Payer: Self-pay | Admitting: Internal Medicine

## 2022-01-23 DIAGNOSIS — R0600 Dyspnea, unspecified: Secondary | ICD-10-CM

## 2022-01-23 DIAGNOSIS — R002 Palpitations: Secondary | ICD-10-CM

## 2022-01-23 DIAGNOSIS — R5383 Other fatigue: Secondary | ICD-10-CM

## 2022-01-27 ENCOUNTER — Ambulatory Visit (INDEPENDENT_AMBULATORY_CARE_PROVIDER_SITE_OTHER): Payer: Medicare PPO

## 2022-01-27 VITALS — BP 102/60 | HR 72 | Temp 98.5°F | Ht 61.0 in | Wt 129.6 lb

## 2022-01-27 DIAGNOSIS — Z0000 Encounter for general adult medical examination without abnormal findings: Secondary | ICD-10-CM

## 2022-01-27 NOTE — Patient Instructions (Signed)
[name] , Thank you for taking time to come for your Medicare Wellness Visit. I appreciate your ongoing commitment to your health goals. Please review the following plan we discussed and let me know if I can assist you in the future.   These are the goals we discussed:  Goals      Client understands the importance of follow-up with providers by attending scheduled visits.        This is a list of the screening recommended for you and due dates:  Health Maintenance  Topic Date Due   COVID-19 Vaccine (4 - 2023-24 season) 12/23/2021   Mammogram  01/15/2023   Medicare Annual Wellness Visit  01/28/2023   DTaP/Tdap/Td vaccine (3 - Td or Tdap) 10/14/2023   Colon Cancer Screening  05/14/2031   Pneumonia Vaccine  Completed   Flu Shot  Completed   DEXA scan (bone density measurement)  Completed   Hepatitis C Screening: USPSTF Recommendation to screen - Ages 78-79 yo.  Completed   Zoster (Shingles) Vaccine  Completed   HPV Vaccine  Aged Out    Advanced directives: No  Conditions/risks identified: Yes  Next appointment: Follow up in one year for your annual wellness visit.   Preventive Care 41 Years and Older, Female Preventive care refers to lifestyle choices and visits with your health care provider that can promote health and wellness. What does preventive care include? A yearly physical exam. This is also called an annual well check. Dental exams once or twice a year. Routine eye exams. Ask your health care provider how often you should have your eyes checked. Personal lifestyle choices, including: Daily care of your teeth and gums. Regular physical activity. Eating a healthy diet. Avoiding tobacco and drug use. Limiting alcohol use. Practicing safe sex. Taking low-dose aspirin every day. Taking vitamin and mineral supplements as recommended by your health care provider. What happens during an annual well check? The services and screenings done by your health care provider  during your annual well check will depend on your age, overall health, lifestyle risk factors, and family history of disease. Counseling  Your health care provider may ask you questions about your: Alcohol use. Tobacco use. Drug use. Emotional well-being. Home and relationship well-being. Sexual activity. Eating habits. History of falls. Memory and ability to understand (cognition). Work and work Statistician. Reproductive health. Screening  You may have the following tests or measurements: Height, weight, and BMI. Blood pressure. Lipid and cholesterol levels. These may be checked every 5 years, or more frequently if you are over 49 years old. Skin check. Lung cancer screening. You may have this screening every year starting at age 75 if you have a 30-pack-year history of smoking and currently smoke or have quit within the past 15 years. Fecal occult blood test (FOBT) of the stool. You may have this test every year starting at age 89. Flexible sigmoidoscopy or colonoscopy. You may have a sigmoidoscopy every 5 years or a colonoscopy every 10 years starting at age 108. Hepatitis C blood test. Hepatitis B blood test. Sexually transmitted disease (STD) testing. Diabetes screening. This is done by checking your blood sugar (glucose) after you have not eaten for a while (fasting). You may have this done every 1-3 years. Bone density scan. This is done to screen for osteoporosis. You may have this done starting at age 4. Mammogram. This may be done every 1-2 years. Talk to your health care provider about how often you should have regular mammograms. Talk  with your health care provider about your test results, treatment options, and if necessary, the need for more tests. Vaccines  Your health care provider may recommend certain vaccines, such as: Influenza vaccine. This is recommended every year. Tetanus, diphtheria, and acellular pertussis (Tdap, Td) vaccine. You may need a Td booster every  10 years. Zoster vaccine. You may need this after age 64. Pneumococcal 13-valent conjugate (PCV13) vaccine. One dose is recommended after age 8. Pneumococcal polysaccharide (PPSV23) vaccine. One dose is recommended after age 80. Talk to your health care provider about which screenings and vaccines you need and how often you need them. This information is not intended to replace advice given to you by your health care provider. Make sure you discuss any questions you have with your health care provider. Document Released: 01/26/2015 Document Revised: 09/19/2015 Document Reviewed: 10/31/2014 Elsevier Interactive Patient Education  2017 [employer] Prevention in the Home Falls can cause injuries. They can happen to people of all ages. There are many things you can do to make your home safe and to help prevent falls. What can I do on the outside of my home? Regularly fix the edges of walkways and driveways and fix any cracks. Remove anything that might make you trip as you walk through a door, such as a raised step or threshold. Trim any bushes or trees on the path to your home. Use bright outdoor lighting. Clear any walking paths of anything that might make someone trip, such as rocks or tools. Regularly check to see if handrails are loose or broken. Make sure that both sides of any steps have handrails. Any raised decks and porches should have guardrails on the edges. Have any leaves, snow, or ice cleared regularly. Use sand or salt on walking paths during winter. Clean up any spills in your garage right away. This includes oil or grease spills. What can I do in the bathroom? Use night lights. Install grab bars by the toilet and in the tub and shower. Do not use towel bars as grab bars. Use non-skid mats or decals in the tub or shower. If you need to sit down in the shower, use a plastic, non-slip stool. Keep the floor dry. Clean up any water that spills on the floor as soon as it  happens. Remove soap buildup in the tub or shower regularly. Attach bath mats securely with double-sided non-slip rug tape. Do not have throw rugs and other things on the floor that can make you trip. What can I do in the bedroom? Use night lights. Make sure that you have a light by your bed that is easy to reach. Do not use any sheets or blankets that are too big for your bed. They should not hang down onto the floor. Have a firm chair that has side arms. You can use this for support while you get dressed. Do not have throw rugs and other things on the floor that can make you trip. What can I do in the kitchen? Clean up any spills right away. Avoid walking on wet floors. Keep items that you use a lot in easy-to-reach places. If you need to reach something above you, use a strong step stool that has a grab bar. Keep electrical cords out of the way. Do not use floor polish or wax that makes floors slippery. If you must use wax, use non-skid floor wax. Do not have throw rugs and other things on the floor that can make you  trip. What can I do with my stairs? Do not leave any items on the stairs. Make sure that there are handrails on both sides of the stairs and use them. Fix handrails that are broken or loose. Make sure that handrails are as long as the stairways. Check any carpeting to make sure that it is firmly attached to the stairs. Fix any carpet that is loose or worn. Avoid having throw rugs at the top or bottom of the stairs. If you do have throw rugs, attach them to the floor with carpet tape. Make sure that you have a light switch at the top of the stairs and the bottom of the stairs. If you do not have them, ask someone to add them for you. What else can I do to help prevent falls? Wear shoes that: Do not have high heels. Have rubber bottoms. Are comfortable and fit you well. Are closed at the toe. Do not wear sandals. If you use a stepladder: Make sure that it is fully opened.  Do not climb a closed stepladder. Make sure that both sides of the stepladder are locked into place. Ask someone to hold it for you, if possible. Clearly mark and make sure that you can see: Any grab bars or handrails. First and last steps. Where the edge of each step is. Use tools that help you move around (mobility aids) if they are needed. These include: Canes. Walkers. Scooters. Crutches. Turn on the lights when you go into a dark area. Replace any light bulbs as soon as they burn out. Set up your furniture so you have a clear path. Avoid moving your furniture around. If any of your floors are uneven, fix them. If there are any pets around you, be aware of where they are. Review your medicines with your doctor. Some medicines can make you feel dizzy. This can increase your chance of falling. Ask your doctor what other things that you can do to help prevent falls. This information is not intended to replace advice given to you by your health care provider. Make sure you discuss any questions you have with your health care provider. Document Released: 10/26/2008 Document Revised: 06/07/2015 Document Reviewed: 02/03/2014 Elsevier Interactive Patient Education  2017 [employer].
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
## 2022-01-28 ENCOUNTER — Telehealth: Payer: Self-pay

## 2022-01-28 ENCOUNTER — Encounter: Payer: Self-pay | Admitting: Internal Medicine

## 2022-01-28 DIAGNOSIS — Z79899 Other long term (current) drug therapy: Secondary | ICD-10-CM

## 2022-01-28 DIAGNOSIS — E785 Hyperlipidemia, unspecified: Secondary | ICD-10-CM

## 2022-01-28 MED ORDER — ATORVASTATIN CALCIUM 20 MG PO TABS: 20.0000 mg | ORAL_TABLET | Freq: Every day | ORAL | 3 refills | Status: DC

## 2022-01-28 NOTE — Telephone Encounter (Signed)
-----  Message from [name] V, MD sent at 01/23/2022 10:03 PM EST ----- LDL is 83   With plaquing LDL should be 70  I would increase lipitor to 20 mg   Follow up lipomed in 12 wks with liver panel

## 2022-01-28 NOTE — Telephone Encounter (Signed)
Go ahead and order echo

## 2022-01-28 NOTE — Telephone Encounter (Signed)
Result and message sent to the pts My Chart for their review.    Will follow up with the pt for lab date.

## 2022-01-29 MED ORDER — ATORVASTATIN CALCIUM 10 MG PO TABS: 5.0000 mg | ORAL_TABLET | Freq: Every day | ORAL | 3 refills | Status: DC

## 2022-01-29 NOTE — Telephone Encounter (Signed)
OK    Would repeat lipomed in 8 to 10 wks    May need to oconsider other meds if does not get to goal

## 2022-02-06 DIAGNOSIS — M85852 Other specified disorders of bone density and structure, left thigh: Secondary | ICD-10-CM | POA: Diagnosis not present

## 2022-02-06 DIAGNOSIS — N816 Rectocele: Secondary | ICD-10-CM | POA: Diagnosis not present

## 2022-02-06 DIAGNOSIS — Z1231 Encounter for screening mammogram for malignant neoplasm of breast: Secondary | ICD-10-CM | POA: Diagnosis not present

## 2022-02-06 DIAGNOSIS — N393 Stress incontinence (female) (male): Secondary | ICD-10-CM | POA: Diagnosis not present

## 2022-02-06 DIAGNOSIS — Z803 Family history of malignant neoplasm of breast: Secondary | ICD-10-CM | POA: Diagnosis not present

## 2022-02-06 DIAGNOSIS — N952 Postmenopausal atrophic vaginitis: Secondary | ICD-10-CM | POA: Diagnosis not present

## 2022-02-06 DIAGNOSIS — N8189 Other female genital prolapse: Secondary | ICD-10-CM | POA: Diagnosis not present

## 2022-02-06 DIAGNOSIS — R3915 Urgency of urination: Secondary | ICD-10-CM | POA: Diagnosis not present

## 2022-02-06 DIAGNOSIS — R159 Full incontinence of feces: Secondary | ICD-10-CM | POA: Diagnosis not present

## 2022-02-10 DIAGNOSIS — Z1231 Encounter for screening mammogram for malignant neoplasm of breast: Secondary | ICD-10-CM | POA: Diagnosis not present

## 2022-02-10 DIAGNOSIS — Z803 Family history of malignant neoplasm of breast: Secondary | ICD-10-CM | POA: Diagnosis not present

## 2022-02-21 ENCOUNTER — Ambulatory Visit (HOSPITAL_COMMUNITY): Payer: Medicare PPO | Attending: Internal Medicine

## 2022-02-21 DIAGNOSIS — R5383 Other fatigue: Secondary | ICD-10-CM

## 2022-02-21 DIAGNOSIS — R0600 Dyspnea, unspecified: Secondary | ICD-10-CM

## 2022-02-21 DIAGNOSIS — R002 Palpitations: Secondary | ICD-10-CM

## 2022-02-21 LAB — ECHOCARDIOGRAM COMPLETE
Area-P 1/2: 2.8 cm2
S' Lateral: 2.8 cm

## 2022-03-04 DIAGNOSIS — I10 Essential (primary) hypertension: Secondary | ICD-10-CM | POA: Diagnosis not present

## 2022-03-04 DIAGNOSIS — Q613 Polycystic kidney, unspecified: Secondary | ICD-10-CM | POA: Diagnosis not present

## 2022-03-04 DIAGNOSIS — D638 Anemia in other chronic diseases classified elsewhere: Secondary | ICD-10-CM | POA: Diagnosis not present

## 2022-03-05 ENCOUNTER — Other Ambulatory Visit (INDEPENDENT_AMBULATORY_CARE_PROVIDER_SITE_OTHER): Payer: Medicare PPO

## 2022-03-05 DIAGNOSIS — N183 Chronic kidney disease, stage 3 unspecified: Secondary | ICD-10-CM

## 2022-03-05 DIAGNOSIS — D649 Anemia, unspecified: Secondary | ICD-10-CM

## 2022-03-05 DIAGNOSIS — R002 Palpitations: Secondary | ICD-10-CM | POA: Diagnosis not present

## 2022-03-05 LAB — CBC WITH DIFFERENTIAL/PLATELET
Basophils Absolute: 0.1 10*3/uL (ref 0.0–0.1)
Basophils Relative: 1.4 % (ref 0.0–3.0)
Eosinophils Absolute: 0.3 10*3/uL (ref 0.0–0.7)
Eosinophils Relative: 7 % — ABNORMAL HIGH (ref 0.0–5.0)
HCT: 35.5 % — ABNORMAL LOW (ref 36.0–46.0)
Hemoglobin: 12.1 g/dL (ref 12.0–15.0)
Lymphocytes Relative: 51.7 % — ABNORMAL HIGH (ref 12.0–46.0)
Lymphs Abs: 2.5 10*3/uL (ref 0.7–4.0)
MCHC: 34.2 g/dL (ref 30.0–36.0)
MCV: 93 fl (ref 78.0–100.0)
Monocytes Absolute: 0.4 10*3/uL (ref 0.1–1.0)
Monocytes Relative: 8.8 % (ref 3.0–12.0)
Neutro Abs: 1.5 10*3/uL (ref 1.4–7.7)
Neutrophils Relative %: 31.1 % — ABNORMAL LOW (ref 43.0–77.0)
Platelets: 226 10*3/uL (ref 150.0–400.0)
RBC: 3.81 Mil/uL — ABNORMAL LOW (ref 3.87–5.11)
RDW: 12.6 % (ref 11.5–15.5)
WBC: 4.8 10*3/uL (ref 4.0–10.5)

## 2022-03-05 LAB — COMPREHENSIVE METABOLIC PANEL
ALT: 23 U/L (ref 0–35)
AST: 24 U/L (ref 0–37)
Albumin: 4.2 g/dL (ref 3.5–5.2)
Alkaline Phosphatase: 42 U/L (ref 39–117)
BUN: 27 mg/dL — ABNORMAL HIGH (ref 6–23)
CO2: 24 mEq/L (ref 19–32)
Calcium: 9.5 mg/dL (ref 8.4–10.5)
Chloride: 105 mEq/L (ref 96–112)
Creatinine, Ser: 1.27 mg/dL — ABNORMAL HIGH (ref 0.40–1.20)
GFR: 41.61 mL/min — ABNORMAL LOW (ref >60.00)
Glucose, Bld: 109 mg/dL — ABNORMAL HIGH (ref 70–99)
Potassium: 4.3 mEq/L (ref 3.5–5.1)
Sodium: 142 mEq/L (ref 135–145)
Total Bilirubin: 0.5 mg/dL (ref 0.2–1.2)
Total Protein: 7 g/dL (ref 6.0–8.3)

## 2022-03-05 LAB — T4, FREE: Free T4: 0.58 ng/dL — ABNORMAL LOW (ref 0.60–1.60)

## 2022-03-05 LAB — HEMOGLOBIN A1C: Hgb A1c MFr Bld: 6 % (ref 4.6–6.5)

## 2022-03-05 LAB — TSH: TSH: 4.18 u[IU]/mL (ref 0.35–5.50)

## 2022-03-06 LAB — IRON,TIBC AND FERRITIN PANEL
%SAT: 33 % (calc) (ref 16–45)
Ferritin: 36 ng/mL (ref 16–288)
Iron: 103 ug/dL (ref 45–160)
TIBC: 311 mcg/dL (calc) (ref 250–450)

## 2022-03-10 ENCOUNTER — Ambulatory Visit: Payer: Medicare PPO | Admitting: Internal Medicine

## 2022-03-10 ENCOUNTER — Encounter: Payer: Self-pay | Admitting: Internal Medicine

## 2022-03-10 VITALS — BP 118/60 | HR 75 | Temp 97.7°F | Ht 61.0 in | Wt 129.0 lb

## 2022-03-10 DIAGNOSIS — N183 Chronic kidney disease, stage 3 unspecified: Secondary | ICD-10-CM | POA: Diagnosis not present

## 2022-03-10 DIAGNOSIS — I10 Essential (primary) hypertension: Secondary | ICD-10-CM

## 2022-03-10 DIAGNOSIS — R5383 Other fatigue: Secondary | ICD-10-CM

## 2022-03-10 DIAGNOSIS — K921 Melena: Secondary | ICD-10-CM

## 2022-03-10 DIAGNOSIS — M8580 Other specified disorders of bone density and structure, unspecified site: Secondary | ICD-10-CM

## 2022-03-10 DIAGNOSIS — R946 Abnormal results of thyroid function studies: Secondary | ICD-10-CM | POA: Diagnosis not present

## 2022-03-10 MED ORDER — OLMESARTAN MEDOXOMIL 40 MG PO TABS: 40.0000 mg | ORAL_TABLET | Freq: Every day | ORAL | 3 refills | Status: DC

## 2022-03-10 MED ORDER — ARNUITY ELLIPTA 100 MCG/ACT IN AEPB: INHALATION_SPRAY | RESPIRATORY_TRACT | 5 refills | Status: DC

## 2022-03-10 MED ORDER — OMEGA-3-ACID ETHYL ESTERS 1 G PO CAPS: 2.0000 | ORAL_CAPSULE | Freq: Two times a day (BID) | ORAL | 11 refills | Status: DC

## 2022-03-10 MED ORDER — EPINEPHRINE 0.3 MG/0.3ML IJ SOAJ: INTRAMUSCULAR | 0 refills | Status: DC

## 2022-03-10 NOTE — Patient Instructions (Signed)
Vit D3 and K2

## 2022-03-10 NOTE — Assessment & Plan Note (Signed)
Borderline low FT4, fatigue

## 2022-03-10 NOTE — Assessment & Plan Note (Signed)
BP is good at home

## 2022-03-10 NOTE — Progress Notes (Signed)
Subjective:  Patient ID: [name], female    DOB: June 14, 1945  Age: 77 y.o. MRN: [id number]  CC: Follow-up (6 MNTH F/U)   HPI ETHELMAE BOITNOTT presents for fatigue, abn tests, CRI C/o blood in stool x 2  Outpatient Medications Prior to Visit  Medication Sig Dispense Refill   aspirin EC 81 MG tablet Take 81 mg by mouth daily.      atorvastatin (LIPITOR) 10 MG tablet Take 0.5 tablets (5 mg total) by mouth daily. 45 tablet 3   Calcium Citrate (CITRACAL PO) Take 1 capsule by mouth daily.      cetirizine (ZYRTEC) 10 MG tablet Take 10 mg by mouth as needed for allergies.     Cholecalciferol (VITAMIN D3) 50 MCG (2000 UT) capsule Take 1 capsule (2,000 Units total) by mouth daily. 100 capsule 3   cyclobenzaprine (FLEXERIL) 10 MG tablet Take 10 mg by mouth 2 (two) times daily as needed.     estradiol (ESTRACE) 0.1 MG/GM vaginal cream Place vaginally.     ipratropium (ATROVENT HFA) 17 MCG/ACT inhaler Inhale 2 puffs into the lungs every 4 (four) hours as needed for wheezing. 1 each 5   Pediatric Multiple Vit-C-FA (MULTIVITAMIN ANIMAL SHAPES, WITH CA/FA,) WITH C & FA CHEW Chew 1 tablet by mouth daily.     tretinoin (RETIN-A) 0.05 % cream      ARNUITY ELLIPTA 100 MCG/ACT AEPB INHALE 1 PUFF BY MOUTH EVERY DAY 30 each 5   EPINEPHrine 0.3 mg/0.3 mL IJ SOAJ injection USE AS DIRECTED 2 each 0   olmesartan (BENICAR) 40 MG tablet Take 1 tablet (40 mg total) by mouth daily. Annual appt due in August must see provider for future refills 90 tablet 3   omega-3 acid ethyl esters (LOVAZA) 1 g capsule TAKE 2 CAPSULES BY MOUTH TWICE DAILY 120 capsule 11   No facility-administered medications prior to visit.    ROS: Review of Systems  Constitutional:  Positive for fatigue. Negative for activity change, appetite change, chills and unexpected weight change.  HENT:  Negative for congestion, mouth sores and sinus pressure.   Eyes:  Negative for visual disturbance.  Respiratory:  Negative for cough and chest  tightness.   Gastrointestinal:  Negative for abdominal pain and nausea.  Genitourinary:  Negative for difficulty urinating, frequency and vaginal pain.  Musculoskeletal:  Negative for back pain and gait problem.  Skin:  Negative for pallor and rash.  Neurological:  Negative for dizziness, tremors, weakness, numbness and headaches.  Psychiatric/Behavioral:  Negative for confusion and sleep disturbance.     Objective:  BP 118/60 (BP Location: Left Arm, Patient Position: Sitting, Cuff Size: Large)   Pulse 75   Temp 97.7 F (36.5 C) (Oral)   Ht '5\' 1"'$  (1.549 m)   Wt 129 lb (58.5 kg)   SpO2 97%   BMI 24.37 kg/m   BP Readings from Last 3 Encounters:  03/10/22 118/60  01/27/22 102/60  11/19/21 110/70    Wt Readings from Last 3 Encounters:  03/10/22 129 lb (58.5 kg)  01/27/22 129 lb 9.6 oz (58.8 kg)  11/19/21 129 lb 6.4 oz (58.7 kg)    Physical Exam Constitutional:      General: She is not in acute distress.    Appearance: She is well-developed. She is obese.  HENT:     Head: Normocephalic.     Right Ear: External ear normal.     Left Ear: External ear normal.     Nose: Nose normal.  Eyes:     General:        Right eye: No discharge.        Left eye: No discharge.     Conjunctiva/sclera: Conjunctivae normal.     Pupils: Pupils are equal, round, and reactive to light.  Neck:     Thyroid: No thyromegaly.     Vascular: No JVD.     Trachea: No tracheal deviation.  Cardiovascular:     Rate and Rhythm: Normal rate and regular rhythm.     Heart sounds: Normal heart sounds.  Pulmonary:     Effort: No respiratory distress.     Breath sounds: No stridor. No wheezing.  Abdominal:     General: Bowel sounds are normal. There is no distension.     Palpations: Abdomen is soft. There is no mass.     Tenderness: There is no abdominal tenderness. There is no guarding or rebound.  Musculoskeletal:        General: No tenderness.     Cervical back: Normal range of motion and neck  supple. No rigidity.  Lymphadenopathy:     Cervical: No cervical adenopathy.  Skin:    Findings: No erythema or rash.  Neurological:     Cranial Nerves: No cranial nerve deficit.     Motor: No abnormal muscle tone.     Coordination: Coordination normal.     Deep Tendon Reflexes: Reflexes normal.  Psychiatric:        Behavior: Behavior normal.        Thought Content: Thought content normal.        Judgment: Judgment normal.     Lab Results  Component Value Date   WBC 4.8 03/05/2022   HGB 12.1 03/05/2022   HCT 35.5 (L) 03/05/2022   PLT 226.0 03/05/2022   GLUCOSE 109 (H) 03/05/2022   CHOL 207 (H) 09/02/2021   TRIG 75.0 09/02/2021   HDL 88.30 09/02/2021   LDLDIRECT 126.2 10/06/2012   LDLCALC 103 (H) 09/02/2021   ALT 23 03/05/2022   AST 24 03/05/2022   NA 142 03/05/2022   K 4.3 03/05/2022   CL 105 03/05/2022   CREATININE 1.27 (H) 03/05/2022   BUN 27 (H) 03/05/2022   CO2 24 03/05/2022   TSH 4.18 03/05/2022   INR 0.95 07/20/2010   HGBA1C 6.0 03/05/2022    No results found.  Assessment & Plan:   Problem List Items Addressed This Visit       Cardiovascular and Mediastinum   Hypertension    BP is good at home      Relevant Medications   EPINEPHrine 0.3 mg/0.3 mL IJ SOAJ injection   olmesartan (BENICAR) 40 MG tablet   omega-3 acid ethyl esters (LOVAZA) 1 g capsule     Musculoskeletal and Integument   Osteopenia    Try Vit D3 and K2        Genitourinary   Chronic renal insufficiency, stage 3 (moderate) (HCC)    Cont w/good hydration        Other   Fatigue    Borderline low FT4, fatigue      Relevant Orders   Comprehensive metabolic panel   T3, free   Blood in stool    Mild x2 Last colon 2023, due in 2026 RTC if relapsed      Abnormal thyroid function test - Primary    Borderline low FT4, fatigue      Relevant Orders   Comprehensive metabolic panel   T3, free   T4,  free   TSH      Meds ordered this encounter  Medications    Fluticasone Furoate (ARNUITY ELLIPTA) 100 MCG/ACT AEPB    Sig: INHALE 1 PUFF BY MOUTH EVERY DAY    Dispense:  30 each    Refill:  5    ZERO refills remain on this prescription. Your patient is requesting advance approval of refills for this medication to PREVENT ANY MISSED DOSES   EPINEPHrine 0.3 mg/0.3 mL IJ SOAJ injection    Sig: USE AS DIRECTED    Dispense:  2 each    Refill:  0   olmesartan (BENICAR) 40 MG tablet    Sig: Take 1 tablet (40 mg total) by mouth daily. Annual appt due in August must see provider for future refills    Dispense:  90 tablet    Refill:  3    ZERO refills remain on this prescription. Your patient is requesting advance approval of refills for this medication to [place]   omega-3 acid ethyl esters (LOVAZA) 1 g capsule    Sig: Take 2 capsules (2 g total) by mouth 2 (two) times daily.    Dispense:  120 capsule    Refill:  11    ZERO refills remain on this prescription. Your patient is requesting advance approval of refills for this medication to [place]      Follow-up: Return in about 3 months (around 06/08/2022) for a follow-up visit.  Walker Kehr, MD

## 2022-03-10 NOTE — Assessment & Plan Note (Addendum)
Mild x2 Last colon 2023, due in 2026 RTC if relapsed

## 2022-03-10 NOTE — Assessment & Plan Note (Signed)
Cont w/good hydration

## 2022-03-10 NOTE — Assessment & Plan Note (Signed)
Try Vit D3 and K2

## 2022-03-13 DIAGNOSIS — I10 Essential (primary) hypertension: Secondary | ICD-10-CM | POA: Diagnosis not present

## 2022-03-13 DIAGNOSIS — Q613 Polycystic kidney, unspecified: Secondary | ICD-10-CM | POA: Diagnosis not present

## 2022-03-21 DIAGNOSIS — Q612 Polycystic kidney, adult type: Secondary | ICD-10-CM | POA: Diagnosis not present

## 2022-04-03 DIAGNOSIS — H40053 Ocular hypertension, bilateral: Secondary | ICD-10-CM | POA: Diagnosis not present

## 2022-04-08 ENCOUNTER — Ambulatory Visit: Payer: Medicare PPO

## 2022-04-10 ENCOUNTER — Other Ambulatory Visit: Payer: Self-pay | Admitting: Internal Medicine

## 2022-04-18 ENCOUNTER — Ambulatory Visit: Payer: Medicare PPO | Attending: Internal Medicine

## 2022-04-18 DIAGNOSIS — Z79899 Other long term (current) drug therapy: Secondary | ICD-10-CM

## 2022-04-18 DIAGNOSIS — E785 Hyperlipidemia, unspecified: Secondary | ICD-10-CM

## 2022-04-19 LAB — HEPATIC FUNCTION PANEL
ALT: 23 IU/L (ref 0–32)
AST: 20 IU/L (ref 0–40)
Albumin: 4.3 g/dL (ref 3.8–4.8)
Alkaline Phosphatase: 48 IU/L (ref 44–121)
Bilirubin Total: 0.4 mg/dL (ref 0.0–1.2)
Bilirubin, Direct: 0.11 mg/dL (ref 0.00–0.40)
Total Protein: 6.6 g/dL (ref 6.0–8.5)

## 2022-04-19 LAB — NMR, LIPOPROFILE
Cholesterol, Total: 174 mg/dL (ref 100–199)
HDL Particle Number: 36 umol/L (ref >=30.5)
HDL-C: 86 mg/dL (ref >39)
LDL Particle Number: 896 nmol/L (ref <1000)
LDL Size: 21.2 nm (ref >20.5)
LDL-C (NIH Calc): 71 mg/dL (ref 0–99)
LP-IR Score: <25 (ref <=45)
Small LDL Particle Number: <90 nmol/L (ref <=527)
Triglycerides: 97 mg/dL (ref 0–149)

## 2022-05-27 ENCOUNTER — Other Ambulatory Visit: Payer: Self-pay | Admitting: Internal Medicine

## 2022-05-28 ENCOUNTER — Other Ambulatory Visit (INDEPENDENT_AMBULATORY_CARE_PROVIDER_SITE_OTHER): Payer: Medicare PPO

## 2022-05-28 DIAGNOSIS — R5383 Other fatigue: Secondary | ICD-10-CM

## 2022-05-28 DIAGNOSIS — R946 Abnormal results of thyroid function studies: Secondary | ICD-10-CM | POA: Diagnosis not present

## 2022-05-28 LAB — COMPREHENSIVE METABOLIC PANEL
ALT: 19 U/L (ref 0–35)
AST: 20 U/L (ref 0–37)
Albumin: 4.2 g/dL (ref 3.5–5.2)
Alkaline Phosphatase: 39 U/L (ref 39–117)
BUN: 30 mg/dL — ABNORMAL HIGH (ref 6–23)
CO2: 28 mEq/L (ref 19–32)
Calcium: 9.3 mg/dL (ref 8.4–10.5)
Chloride: 104 mEq/L (ref 96–112)
Creatinine, Ser: 1.28 mg/dL — ABNORMAL HIGH (ref 0.40–1.20)
GFR: 41.15 mL/min — ABNORMAL LOW (ref >60.00)
Glucose, Bld: 110 mg/dL — ABNORMAL HIGH (ref 70–99)
Potassium: 3.9 mEq/L (ref 3.5–5.1)
Sodium: 140 mEq/L (ref 135–145)
Total Bilirubin: 0.5 mg/dL (ref 0.2–1.2)
Total Protein: 7.1 g/dL (ref 6.0–8.3)

## 2022-05-28 LAB — TSH: TSH: 3.72 u[IU]/mL (ref 0.35–5.50)

## 2022-05-28 LAB — T3, FREE: T3, Free: 2.5 pg/mL (ref 2.3–4.2)

## 2022-05-28 LAB — T4, FREE: Free T4: 0.72 ng/dL (ref 0.60–1.60)

## 2022-06-02 ENCOUNTER — Ambulatory Visit: Payer: Medicare PPO | Admitting: Internal Medicine

## 2022-06-02 ENCOUNTER — Encounter: Payer: Self-pay | Admitting: Internal Medicine

## 2022-06-02 VITALS — BP 118/78 | HR 75 | Temp 98.1°F | Ht 61.0 in | Wt 127.0 lb

## 2022-06-02 DIAGNOSIS — E785 Hyperlipidemia, unspecified: Secondary | ICD-10-CM

## 2022-06-02 DIAGNOSIS — N183 Chronic kidney disease, stage 3 unspecified: Secondary | ICD-10-CM | POA: Diagnosis not present

## 2022-06-02 DIAGNOSIS — R002 Palpitations: Secondary | ICD-10-CM | POA: Diagnosis not present

## 2022-06-02 MED ORDER — EPINEPHRINE 0.3 MG/0.3ML IJ SOAJ: INTRAMUSCULAR | 0 refills | Status: AC

## 2022-06-02 NOTE — Assessment & Plan Note (Signed)
Nephrology follow-up q 6 mo

## 2022-06-02 NOTE — Progress Notes (Signed)
Subjective:  Patient ID: [name], female    DOB: 10/10/1945  Age: 77 y.o. MRN: [id number]  CC: Follow-up (3 MNTH F/U)   HPI SKYY SCHRAEDER presents for HTN, CRI, asthma  Outpatient Medications Prior to Visit  Medication Sig Dispense Refill   aspirin EC 81 MG tablet Take 81 mg by mouth daily.      atorvastatin (LIPITOR) 10 MG tablet TAKE 1 TABLET BY MOUTH FOUR TIMES A WEEK. 90 tablet 3   Calcium Citrate (CITRACAL PO) Take 1 capsule by mouth daily.      cetirizine (ZYRTEC) 10 MG tablet Take 10 mg by mouth as needed for allergies.     Cholecalciferol (VITAMIN D3) 50 MCG (2000 UT) capsule Take 1 capsule (2,000 Units total) by mouth daily. 100 capsule 3   cyclobenzaprine (FLEXERIL) 10 MG tablet Take 10 mg by mouth 2 (two) times daily as needed.     estradiol (ESTRACE) 0.1 MG/GM vaginal cream Place vaginally.     Fluticasone Furoate (ARNUITY ELLIPTA) 100 MCG/ACT AEPB INHALE 1 PUFF BY MOUTH EVERY DAY 30 each 5   ipratropium (ATROVENT HFA) 17 MCG/ACT inhaler Inhale 2 puffs into the lungs every 4 (four) hours as needed for wheezing. 1 each 5   olmesartan (BENICAR) 40 MG tablet TAKE 1 TABLET BY MOUTH DAILY 90 tablet 3   omega-3 acid ethyl esters (LOVAZA) 1 g capsule Take 2 capsules (2 g total) by mouth 2 (two) times daily. 120 capsule 11   Pediatric Multiple Vit-C-FA (MULTIVITAMIN ANIMAL SHAPES, WITH CA/FA,) WITH C & FA CHEW Chew 1 tablet by mouth daily.     tretinoin (RETIN-A) 0.05 % cream      EPINEPHrine 0.3 mg/0.3 mL IJ SOAJ injection USE AS DIRECTED 2 each 0   No facility-administered medications prior to visit.    ROS: Review of Systems  Constitutional:  Negative for activity change, appetite change, chills, fatigue and unexpected weight change.  HENT:  Negative for congestion, mouth sores and sinus pressure.   Eyes:  Negative for visual disturbance.  Respiratory:  Negative for cough and chest tightness.   Gastrointestinal:  Negative for abdominal pain and nausea.   Genitourinary:  Negative for difficulty urinating, frequency and vaginal pain.  Musculoskeletal:  Negative for back pain and gait problem.  Skin:  Negative for pallor and rash.  Neurological:  Negative for dizziness, tremors, weakness, numbness and headaches.  Psychiatric/Behavioral:  Negative for confusion and sleep disturbance.     Objective:  BP 118/78 (BP Location: Right Arm, Patient Position: Sitting, Cuff Size: Large)   Pulse 75   Temp 98.1 F (36.7 C) (Oral)   Ht 5\' 1"  (1.549 m)   Wt 127 lb (57.6 kg)   SpO2 95%   BMI 24.00 kg/m   BP Readings from Last 3 Encounters:  06/02/22 118/78  03/10/22 118/60  01/27/22 102/60    Wt Readings from Last 3 Encounters:  06/02/22 127 lb (57.6 kg)  03/10/22 129 lb (58.5 kg)  01/27/22 129 lb 9.6 oz (58.8 kg)    Physical Exam Constitutional:      General: She is not in acute distress.    Appearance: Normal appearance. She is well-developed.  HENT:     Head: Normocephalic.     Right Ear: External ear normal.     Left Ear: External ear normal.     Nose: Nose normal.  Eyes:     General:        Right eye: No discharge.  Left eye: No discharge.     Conjunctiva/sclera: Conjunctivae normal.     Pupils: Pupils are equal, round, and reactive to light.  Neck:     Thyroid: No thyromegaly.     Vascular: No JVD.     Trachea: No tracheal deviation.  Cardiovascular:     Rate and Rhythm: Normal rate and regular rhythm.     Heart sounds: Normal heart sounds.  Pulmonary:     Effort: No respiratory distress.     Breath sounds: No stridor. No wheezing.  Abdominal:     General: Bowel sounds are normal. There is no distension.     Palpations: Abdomen is soft. There is no mass.     Tenderness: There is no abdominal tenderness. There is no guarding or rebound.  Musculoskeletal:        General: No tenderness.     Cervical back: Normal range of motion and neck supple. No rigidity.  Lymphadenopathy:     Cervical: No cervical  adenopathy.  Skin:    Findings: No erythema or rash.  Neurological:     Cranial Nerves: No cranial nerve deficit.     Motor: No abnormal muscle tone.     Coordination: Coordination normal.     Deep Tendon Reflexes: Reflexes normal.  Psychiatric:        Behavior: Behavior normal.        Thought Content: Thought content normal.        Judgment: Judgment normal.     Lab Results  Component Value Date   WBC 4.8 03/05/2022   HGB 12.1 03/05/2022   HCT 35.5 (L) 03/05/2022   PLT 226.0 03/05/2022   GLUCOSE 110 (H) 05/28/2022   CHOL 207 (H) 09/02/2021   TRIG 75.0 09/02/2021   HDL 88.30 09/02/2021   LDLDIRECT 126.2 10/06/2012   LDLCALC 103 (H) 09/02/2021   ALT 19 05/28/2022   AST 20 05/28/2022   NA 140 05/28/2022   K 3.9 05/28/2022   CL 104 05/28/2022   CREATININE 1.28 (H) 05/28/2022   BUN 30 (H) 05/28/2022   CO2 28 05/28/2022   TSH 3.72 05/28/2022   INR 0.95 07/20/2010   HGBA1C 6.0 03/05/2022    No results found.  Assessment & Plan:   Problem List Items Addressed This Visit     Dyslipidemia - Primary    F/u w/Dr Tenny Craw. On Lipitor 5 mg/d, baby ASA      Relevant Orders   TSH   Urinalysis   CBC with Differential/Platelet   Lipid panel   Comprehensive metabolic panel   Palpitations    Resolved       Relevant Orders   TSH   Urinalysis   CBC with Differential/Platelet   Lipid panel   Comprehensive metabolic panel   Chronic renal insufficiency, stage 3 (moderate) (HCC)    Nephrology follow-up q 6 mo       Relevant Orders   TSH   Urinalysis   CBC with Differential/Platelet   Lipid panel   Comprehensive metabolic panel      Meds ordered this encounter  Medications   EPINEPHrine 0.3 mg/0.3 mL IJ SOAJ injection    Sig: USE AS DIRECTED    Dispense:  2 each    Refill:  0      Follow-up: Return in about 6 months (around 12/03/2022) for a follow-up visit.  Sonda Primes, MD

## 2022-06-02 NOTE — Assessment & Plan Note (Signed)
F/u w/Dr Tenny Craw. On Lipitor 5 mg/d, baby ASA

## 2022-06-02 NOTE — Assessment & Plan Note (Signed)
Resolved

## 2022-08-21 DIAGNOSIS — L820 Inflamed seborrheic keratosis: Secondary | ICD-10-CM | POA: Diagnosis not present

## 2022-08-21 DIAGNOSIS — D225 Melanocytic nevi of trunk: Secondary | ICD-10-CM | POA: Diagnosis not present

## 2022-08-21 DIAGNOSIS — Z85828 Personal history of other malignant neoplasm of skin: Secondary | ICD-10-CM | POA: Diagnosis not present

## 2022-08-21 DIAGNOSIS — L821 Other seborrheic keratosis: Secondary | ICD-10-CM | POA: Diagnosis not present

## 2022-08-21 DIAGNOSIS — L570 Actinic keratosis: Secondary | ICD-10-CM | POA: Diagnosis not present

## 2022-08-21 DIAGNOSIS — L812 Freckles: Secondary | ICD-10-CM | POA: Diagnosis not present

## 2022-08-25 DIAGNOSIS — D638 Anemia in other chronic diseases classified elsewhere: Secondary | ICD-10-CM | POA: Diagnosis not present

## 2022-08-25 DIAGNOSIS — Q613 Polycystic kidney, unspecified: Secondary | ICD-10-CM | POA: Diagnosis not present

## 2022-09-04 DIAGNOSIS — Q613 Polycystic kidney, unspecified: Secondary | ICD-10-CM | POA: Diagnosis not present

## 2022-09-04 DIAGNOSIS — D638 Anemia in other chronic diseases classified elsewhere: Secondary | ICD-10-CM | POA: Diagnosis not present

## 2022-09-04 DIAGNOSIS — I10 Essential (primary) hypertension: Secondary | ICD-10-CM | POA: Diagnosis not present

## 2022-10-30 ENCOUNTER — Other Ambulatory Visit (HOSPITAL_BASED_OUTPATIENT_CLINIC_OR_DEPARTMENT_OTHER): Payer: Self-pay

## 2022-10-30 MED ORDER — FLUAD 0.5 ML IM SUSY
0.5000 mL | PREFILLED_SYRINGE | Freq: Once | INTRAMUSCULAR | 0 refills | Status: AC
  Filled 2022-10-30: qty 0.5, 1d supply, fill #0

## 2022-11-13 ENCOUNTER — Other Ambulatory Visit (HOSPITAL_BASED_OUTPATIENT_CLINIC_OR_DEPARTMENT_OTHER): Payer: Self-pay

## 2022-11-13 MED ORDER — RSVPREF3 VAC RECOMB ADJUVANTED 120 MCG/0.5ML IM SUSR
0.5000 mL | Freq: Once | INTRAMUSCULAR | 0 refills | Status: AC
  Filled 2022-11-13: qty 0.5, 1d supply, fill #0

## 2022-11-26 ENCOUNTER — Other Ambulatory Visit (INDEPENDENT_AMBULATORY_CARE_PROVIDER_SITE_OTHER): Payer: Medicare PPO

## 2022-11-26 DIAGNOSIS — R002 Palpitations: Secondary | ICD-10-CM

## 2022-11-26 DIAGNOSIS — E785 Hyperlipidemia, unspecified: Secondary | ICD-10-CM | POA: Diagnosis not present

## 2022-11-26 DIAGNOSIS — N183 Chronic kidney disease, stage 3 unspecified: Secondary | ICD-10-CM

## 2022-11-26 LAB — COMPREHENSIVE METABOLIC PANEL
ALT: 20 U/L (ref 0–35)
AST: 22 U/L (ref 0–37)
Albumin: 4.2 g/dL (ref 3.5–5.2)
Alkaline Phosphatase: 47 U/L (ref 39–117)
BUN: 36 mg/dL — ABNORMAL HIGH (ref 6–23)
CO2: 27 meq/L (ref 19–32)
Calcium: 9.1 mg/dL (ref 8.4–10.5)
Chloride: 105 meq/L (ref 96–112)
Creatinine, Ser: 1.36 mg/dL — ABNORMAL HIGH (ref 0.40–1.20)
GFR: 38.13 mL/min — ABNORMAL LOW (ref >60.00)
Glucose, Bld: 103 mg/dL — ABNORMAL HIGH (ref 70–99)
Potassium: 4.4 meq/L (ref 3.5–5.1)
Sodium: 140 meq/L (ref 135–145)
Total Bilirubin: 0.5 mg/dL (ref 0.2–1.2)
Total Protein: 7.1 g/dL (ref 6.0–8.3)

## 2022-11-26 LAB — CBC WITH DIFFERENTIAL/PLATELET
Basophils Absolute: 0.1 10*3/uL (ref 0.0–0.1)
Basophils Relative: 1 % (ref 0.0–3.0)
Eosinophils Absolute: 0.3 10*3/uL (ref 0.0–0.7)
Eosinophils Relative: 5.9 % — ABNORMAL HIGH (ref 0.0–5.0)
HCT: 35.4 % — ABNORMAL LOW (ref 36.0–46.0)
Hemoglobin: 11.8 g/dL — ABNORMAL LOW (ref 12.0–15.0)
Lymphocytes Relative: 51.3 % — ABNORMAL HIGH (ref 12.0–46.0)
Lymphs Abs: 2.9 10*3/uL (ref 0.7–4.0)
MCHC: 33.4 g/dL (ref 30.0–36.0)
MCV: 94.3 fL (ref 78.0–100.0)
Monocytes Absolute: 0.5 10*3/uL (ref 0.1–1.0)
Monocytes Relative: 9 % (ref 3.0–12.0)
Neutro Abs: 1.8 10*3/uL (ref 1.4–7.7)
Neutrophils Relative %: 32.8 % — ABNORMAL LOW (ref 43.0–77.0)
Platelets: 229 10*3/uL (ref 150.0–400.0)
RBC: 3.76 Mil/uL — ABNORMAL LOW (ref 3.87–5.11)
RDW: 13.1 % (ref 11.5–15.5)
WBC: 5.6 10*3/uL (ref 4.0–10.5)

## 2022-11-26 LAB — URINALYSIS
Bilirubin Urine: NEGATIVE
Hgb urine dipstick: NEGATIVE
Ketones, ur: NEGATIVE
Leukocytes,Ua: NEGATIVE
Nitrite: NEGATIVE
Specific Gravity, Urine: 1.015 (ref 1.000–1.030)
Total Protein, Urine: NEGATIVE
Urine Glucose: NEGATIVE
Urobilinogen, UA: 0.2 (ref 0.0–1.0)
pH: 6 (ref 5.0–8.0)

## 2022-11-26 LAB — TSH: TSH: 4.52 u[IU]/mL (ref 0.35–5.50)

## 2022-11-26 LAB — LIPID PANEL
Cholesterol: 196 mg/dL (ref 0–200)
HDL: 85.5 mg/dL (ref >39.00)
LDL Cholesterol: 92 mg/dL (ref 0–99)
NonHDL: 110.48
Total CHOL/HDL Ratio: 2
Triglycerides: 91 mg/dL (ref 0.0–149.0)
VLDL: 18.2 mg/dL (ref 0.0–40.0)

## 2022-11-27 ENCOUNTER — Encounter: Payer: Self-pay | Admitting: Internal Medicine

## 2022-11-27 DIAGNOSIS — L820 Inflamed seborrheic keratosis: Secondary | ICD-10-CM | POA: Diagnosis not present

## 2022-11-27 DIAGNOSIS — Z85828 Personal history of other malignant neoplasm of skin: Secondary | ICD-10-CM | POA: Diagnosis not present

## 2022-11-27 DIAGNOSIS — I781 Nevus, non-neoplastic: Secondary | ICD-10-CM | POA: Diagnosis not present

## 2022-11-27 DIAGNOSIS — D485 Neoplasm of uncertain behavior of skin: Secondary | ICD-10-CM | POA: Diagnosis not present

## 2022-11-30 ENCOUNTER — Other Ambulatory Visit: Payer: Self-pay | Admitting: Internal Medicine

## 2022-11-30 DIAGNOSIS — R300 Dysuria: Secondary | ICD-10-CM

## 2022-12-03 ENCOUNTER — Encounter: Payer: Self-pay | Admitting: Internal Medicine

## 2022-12-03 ENCOUNTER — Ambulatory Visit: Payer: Medicare PPO | Admitting: Internal Medicine

## 2022-12-03 VITALS — BP 118/62 | HR 73 | Temp 98.1°F | Ht 61.0 in | Wt 128.8 lb

## 2022-12-03 DIAGNOSIS — R739 Hyperglycemia, unspecified: Secondary | ICD-10-CM

## 2022-12-03 DIAGNOSIS — I10 Essential (primary) hypertension: Secondary | ICD-10-CM

## 2022-12-03 DIAGNOSIS — J4521 Mild intermittent asthma with (acute) exacerbation: Secondary | ICD-10-CM | POA: Diagnosis not present

## 2022-12-03 DIAGNOSIS — Z7184 Encounter for health counseling related to travel: Secondary | ICD-10-CM

## 2022-12-03 DIAGNOSIS — E785 Hyperlipidemia, unspecified: Secondary | ICD-10-CM

## 2022-12-03 MED ORDER — CYCLOBENZAPRINE HCL 10 MG PO TABS: 10.0000 mg | ORAL_TABLET | Freq: Two times a day (BID) | ORAL | 1 refills | Status: DC | PRN

## 2022-12-03 MED ORDER — METHYLPREDNISOLONE 4 MG PO TBPK: ORAL_TABLET | ORAL | 0 refills | Status: DC

## 2022-12-03 MED ORDER — AZITHROMYCIN 250 MG PO TABS: ORAL_TABLET | ORAL | 0 refills | Status: DC

## 2022-12-03 MED ORDER — ONDANSETRON HCL 4 MG PO TABS: 4.0000 mg | ORAL_TABLET | Freq: Three times a day (TID) | ORAL | 0 refills | Status: DC | PRN

## 2022-12-03 NOTE — Assessment & Plan Note (Signed)
BP is good at home 

## 2022-12-03 NOTE — Assessment & Plan Note (Signed)
F/u w/Dr Tenny Craw. On Lipitor 5 mg/d, baby ASA

## 2022-12-03 NOTE — Progress Notes (Signed)
Subjective:  Patient ID: [name], female    DOB: [birth year]  Age: 77 y.o. MRN: [id number]  CC: Follow-up (6 Month Follow Up)   HPI [name] presents for   Outpatient Medications Prior to Visit  Medication Sig Dispense Refill   aspirin EC 81 MG tablet Take 81 mg by mouth daily.      atorvastatin (LIPITOR) 10 MG tablet TAKE 1 TABLET BY MOUTH FOUR TIMES A WEEK. 90 tablet 3   Calcium Citrate (CITRACAL PO) Take 1 capsule by mouth daily.      cetirizine (ZYRTEC) 10 MG tablet Take 10 mg by mouth as needed for allergies.     Cholecalciferol (VITAMIN D3) 50 MCG (2000 UT) capsule Take 1 capsule (2,000 Units total) by mouth daily. 100 capsule 3   EPINEPHrine 0.3 mg/0.3 mL IJ SOAJ injection USE AS DIRECTED 2 each 0   estradiol (ESTRACE) 0.1 MG/GM vaginal cream Place vaginally.     Fluticasone Furoate (ARNUITY ELLIPTA) 100 MCG/ACT AEPB INHALE 1 PUFF BY MOUTH EVERY DAY 30 each 5   ipratropium (ATROVENT HFA) 17 MCG/ACT inhaler Inhale 2 puffs into the lungs every 4 (four) hours as needed for wheezing. 1 each 5   olmesartan (BENICAR) 40 MG tablet TAKE 1 TABLET BY MOUTH DAILY 90 tablet 3   omega-3 acid ethyl esters (LOVAZA) 1 g capsule Take 2 capsules (2 g total) by mouth 2 (two) times daily. 120 capsule 11   Pediatric Multiple Vit-C-FA (MULTIVITAMIN ANIMAL SHAPES, WITH CA/FA,) WITH C & FA CHEW Chew 1 tablet by mouth daily.     tretinoin (RETIN-A) 0.05 % cream      cyclobenzaprine (FLEXERIL) 10 MG tablet Take 10 mg by mouth 2 (two) times daily as needed.     No facility-administered medications prior to visit.    ROS: Review of Systems  Constitutional:  Negative for activity change, appetite change, chills, fatigue and unexpected weight change.  HENT:  Negative for congestion, mouth sores and sinus pressure.   Eyes:  Negative for visual disturbance.  Respiratory:  Negative for cough and chest tightness.   Gastrointestinal:  Negative for abdominal pain and nausea.  Genitourinary:   Negative for difficulty urinating, frequency and vaginal pain.  Musculoskeletal:  Positive for back pain. Negative for gait problem.  Skin:  Negative for pallor and rash.  Neurological:  Negative for dizziness, tremors, weakness, numbness and headaches.  Psychiatric/Behavioral:  Negative for confusion, decreased concentration and sleep disturbance. The patient is not nervous/anxious.     Objective:  BP 118/62   Pulse 73   Temp 98.1 F (36.7 C) (Oral)   Ht 5\' 1"  (1.549 m)   Wt 128 lb 12.8 oz (58.4 kg)   SpO2 98%   BMI 24.34 kg/m   BP Readings from Last 3 Encounters:  12/03/22 118/62  06/02/22 118/78  03/10/22 118/60    Wt Readings from Last 3 Encounters:  12/03/22 128 lb 12.8 oz (58.4 kg)  06/02/22 127 lb (57.6 kg)  03/10/22 129 lb (58.5 kg)    Physical Exam Constitutional:      General: She is not in acute distress.    Appearance: She is well-developed.  HENT:     Head: Normocephalic.     Right Ear: External ear normal.     Left Ear: External ear normal.     Nose: Nose normal.  Eyes:     General:        Right eye: No discharge.  Left eye: No discharge.     Conjunctiva/sclera: Conjunctivae normal.     Pupils: Pupils are equal, round, and reactive to light.  Neck:     Thyroid: No thyromegaly.     Vascular: No JVD.     Trachea: No tracheal deviation.  Cardiovascular:     Rate and Rhythm: Normal rate and regular rhythm.     Heart sounds: Normal heart sounds.  Pulmonary:     Effort: No respiratory distress.     Breath sounds: No stridor. No wheezing.  Abdominal:     General: Bowel sounds are normal. There is no distension.     Palpations: Abdomen is soft. There is no mass.     Tenderness: There is no abdominal tenderness. There is no guarding or rebound.  Musculoskeletal:        General: No tenderness.     Cervical back: Normal range of motion and neck supple. No rigidity.  Lymphadenopathy:     Cervical: No cervical adenopathy.  Skin:    Findings: No  erythema or rash.  Neurological:     Cranial Nerves: No cranial nerve deficit.     Motor: No abnormal muscle tone.     Coordination: Coordination normal.     Deep Tendon Reflexes: Reflexes normal.  Psychiatric:        Behavior: Behavior normal.        Thought Content: Thought content normal.        Judgment: Judgment normal.     Lab Results  Component Value Date   WBC 5.6 11/26/2022   HGB 11.8 (L) 11/26/2022   HCT 35.4 (L) 11/26/2022   PLT 229.0 11/26/2022   GLUCOSE 103 (H) 11/26/2022   CHOL 196 11/26/2022   TRIG 91.0 11/26/2022   HDL 85.50 11/26/2022   LDLDIRECT 126.2 10/06/2012   LDLCALC 92 11/26/2022   ALT 20 11/26/2022   AST 22 11/26/2022   NA 140 11/26/2022   K 4.4 11/26/2022   CL 105 11/26/2022   CREATININE 1.36 (H) 11/26/2022   BUN 36 (H) 11/26/2022   CO2 27 11/26/2022   TSH 4.52 11/26/2022   INR 0.95 07/20/2010   HGBA1C 6.0 03/05/2022    No results found.  Assessment & Plan:   Problem List Items Addressed This Visit     Asthma - Primary    Advised to take Claritin daily Continue with Arnuity Ellipta      Relevant Medications   methylPREDNISolone (MEDROL DOSEPAK) 4 MG TBPK tablet   Hypertension    BP is good at home      Dyslipidemia    F/u w/Dr Tenny Craw. On Lipitor 5 mg/d, baby ASA      Travel advice encounter    Dec 2024 - [place] Rx's given Compression socks           Meds ordered this encounter  Medications   azithromycin (ZITHROMAX Z-PAK) 250 MG tablet    Sig: As directed    Dispense:  6 tablet    Refill:  0   methylPREDNISolone (MEDROL DOSEPAK) 4 MG TBPK tablet    Sig: As directed    Dispense:  21 tablet    Refill:  0   cyclobenzaprine (FLEXERIL) 10 MG tablet    Sig: Take 1 tablet (10 mg total) by mouth 2 (two) times daily as needed.    Dispense:  60 tablet    Refill:  1   ondansetron (ZOFRAN) 4 MG tablet    Sig: Take 1 tablet (4 mg total)  by mouth every 8 (eight) hours as needed for nausea or vomiting.    Dispense:  20  tablet    Refill:  0      Follow-up: Return in about 6 months (around 06/02/2023) for Wellness Exam.  Sonda Primes, MD

## 2022-12-03 NOTE — Assessment & Plan Note (Signed)
Advised to take Claritin daily Continue with Arnuity Ellipta

## 2022-12-03 NOTE — Patient Instructions (Signed)
For a mild COVID-19 case - take zinc 50 mg a day for 1 week, vitamin C 1000 mg daily for 1 week, vitamin D2 50,000 units weekly for 2 months (unless  taking vitamin D daily already), an antioxidant Quercetin 500 mg twice a day for 1 week (if you can get it quick enough). Take Allegra or Benadryl.  Maintain good oral hydration and take Tylenol for high fever.  Call if problems. Isolate for 5 days, then wear a mask for 5 days per CDC.  

## 2022-12-03 NOTE — Assessment & Plan Note (Signed)
Dec 2024 - [place] Rx's given Compression socks

## 2022-12-23 ENCOUNTER — Encounter: Payer: Self-pay | Admitting: Internal Medicine

## 2022-12-25 DIAGNOSIS — W010XXA Fall on same level from slipping, tripping and stumbling without subsequent striking against object, initial encounter: Secondary | ICD-10-CM | POA: Diagnosis not present

## 2022-12-25 DIAGNOSIS — S82832A Other fracture of upper and lower end of left fibula, initial encounter for closed fracture: Secondary | ICD-10-CM | POA: Diagnosis not present

## 2022-12-25 DIAGNOSIS — M25571 Pain in right ankle and joints of right foot: Secondary | ICD-10-CM | POA: Diagnosis not present

## 2022-12-25 DIAGNOSIS — S82831A Other fracture of upper and lower end of right fibula, initial encounter for closed fracture: Secondary | ICD-10-CM | POA: Diagnosis not present

## 2022-12-28 ENCOUNTER — Other Ambulatory Visit: Payer: Self-pay | Admitting: Internal Medicine

## 2022-12-28 MED ORDER — PREDNISONE 10 MG PO TABS: ORAL_TABLET | ORAL | 1 refills | Status: DC

## 2022-12-29 DIAGNOSIS — S82832A Other fracture of upper and lower end of left fibula, initial encounter for closed fracture: Secondary | ICD-10-CM | POA: Diagnosis not present

## 2023-01-02 DIAGNOSIS — S82831D Other fracture of upper and lower end of right fibula, subsequent encounter for closed fracture with routine healing: Secondary | ICD-10-CM | POA: Diagnosis not present

## 2023-01-02 DIAGNOSIS — S82832D Other fracture of upper and lower end of left fibula, subsequent encounter for closed fracture with routine healing: Secondary | ICD-10-CM | POA: Diagnosis not present

## 2023-01-02 DIAGNOSIS — W010XXD Fall on same level from slipping, tripping and stumbling without subsequent striking against object, subsequent encounter: Secondary | ICD-10-CM | POA: Diagnosis not present

## 2023-01-26 ENCOUNTER — Telehealth: Payer: Self-pay | Admitting: Pulmonary Disease

## 2023-01-26 DIAGNOSIS — W010XXD Fall on same level from slipping, tripping and stumbling without subsequent striking against object, subsequent encounter: Secondary | ICD-10-CM | POA: Diagnosis not present

## 2023-01-26 DIAGNOSIS — S82832A Other fracture of upper and lower end of left fibula, initial encounter for closed fracture: Secondary | ICD-10-CM | POA: Diagnosis not present

## 2023-01-26 DIAGNOSIS — S82832D Other fracture of upper and lower end of left fibula, subsequent encounter for closed fracture with routine healing: Secondary | ICD-10-CM | POA: Diagnosis not present

## 2023-01-26 DIAGNOSIS — S82831D Other fracture of upper and lower end of right fibula, subsequent encounter for closed fracture with routine healing: Secondary | ICD-10-CM | POA: Diagnosis not present

## 2023-01-27 ENCOUNTER — Encounter (HOSPITAL_COMMUNITY): Payer: Self-pay

## 2023-01-27 ENCOUNTER — Ambulatory Visit: Payer: Medicare PPO | Admitting: Nurse Practitioner

## 2023-01-27 ENCOUNTER — Ambulatory Visit (INDEPENDENT_AMBULATORY_CARE_PROVIDER_SITE_OTHER): Payer: Medicare PPO

## 2023-01-27 ENCOUNTER — Ambulatory Visit: Payer: Self-pay | Admitting: Internal Medicine

## 2023-01-27 ENCOUNTER — Ambulatory Visit (HOSPITAL_COMMUNITY)
Admission: RE | Admit: 2023-01-27 | Discharge: 2023-01-27 | Disposition: A | Discharge status: Home / Self Care | Payer: Medicare PPO | Source: Ambulatory Visit | Attending: Emergency Medicine | Admitting: Emergency Medicine

## 2023-01-27 VITALS — BP 176/67 | HR 82 | Temp 98.6°F | Resp 16

## 2023-01-27 DIAGNOSIS — R051 Acute cough: Secondary | ICD-10-CM

## 2023-01-27 DIAGNOSIS — J4541 Moderate persistent asthma with (acute) exacerbation: Secondary | ICD-10-CM | POA: Diagnosis not present

## 2023-01-27 DIAGNOSIS — R059 Cough, unspecified: Secondary | ICD-10-CM | POA: Diagnosis not present

## 2023-01-27 DIAGNOSIS — R0602 Shortness of breath: Secondary | ICD-10-CM | POA: Diagnosis not present

## 2023-01-27 MED ORDER — IPRATROPIUM BROMIDE 0.02 % IN SOLN
RESPIRATORY_TRACT | Status: AC
  Filled 2023-01-27: qty 2.5

## 2023-01-27 MED ORDER — PREDNISONE 20 MG PO TABS: 40.0000 mg | ORAL_TABLET | Freq: Every day | ORAL | 0 refills | Status: AC

## 2023-01-27 MED ORDER — IPRATROPIUM BROMIDE 0.02 % IN SOLN
0.5000 mg | Freq: Once | RESPIRATORY_TRACT | Status: AC
Start: 2023-01-27 — End: 2023-01-27
  Administered 2023-01-27: 0.5 mg via RESPIRATORY_TRACT

## 2023-01-27 NOTE — Discharge Instructions (Addendum)
 Start taking prednisone  once daily for 5 days.  Continue using your inhaler as needed.  I have attached a pulmonologist she can follow-up with with regarding your chronic respiratory issues. If your symptoms persist come back for reevaluation.  If your symptoms worsen or you develop severe shortness of breath, chest pain, fevers unrelieved with medication please seek immediate medical treatment in the ER.

## 2023-01-27 NOTE — Telephone Encounter (Addendum)
 Copied from CRM [id number]. Topic: Clinical - Red Word Triage >> Jan 27, 2023  7:57 AM Robinson DEL wrote: [facility] that prompted transfer to Nurse Triage: Patient states she's having trouble breathing just got back from [place].    Chief Complaint:   Asthmatic  Diagnosed with Covid and started on medication . She was outside her time frame limit for an antiviral.  Patient is currently taking prescribed medication from Dr SHAUNNA and she feels like they are not working. Patient requesting a nebulizer treatment from her doctor. After review of medication for clarity . RN Agent identified patient was not using her rescuer inhaler. Education was done on   Asthma Red zone , Yellow zone. Patient Pulse Ox on Room air  96%.  Symptoms: Shortness of Breath  upon exertion and lying down. Frequency:  Day 2 of SOB Pertinent Negatives: Patient denies fever.  Disposition: [] ED /[x] Urgent Care (no appt availability in office) / [] Appointment(In office/virtual)/ []  [facility]/ [] Home Care/ [] Refused Recommended Disposition /[] [facility] [facility]/ []  Follow-up with PCP Additional Notes: Appointment made for patient for this morning  at [facility].  Reviewed all recommendations. Patient verbalized understanding.Patient is requesting Dr. SHAUNNA. to review to see if she needs anything further.   Reason for Disposition  Continuous (nonstop) coughing interferes with work, school, or sleeping  [1] MILD difficulty breathing (e.g., minimal/no SOB at rest, SOB with walking, pulse <100) AND [2] NEW-onset or WORSE than normal  Answer Assessment - Initial Assessment Questions 1. RESPIRATORY STATUS: Describe your breathing? (e.g., wheezing, shortness of breath, unable to speak, severe coughing)        Shortness of Breath  In her red zone  not improve- Covid  2. ONSET: When did this breathing problem begin?      *No Answer* wendenesday of last week zithroymycin  zp and prednisone  3. PATTERN Does the  difficult breathing come and go, or has it been constant since it started?      Couging  4. SEVERITY: How bad is your breathing? (e.g., mild, moderate, severe)    - MILD: No SOB at rest, mild SOB with walking, speaks normally in sentences, can lie down, no retractions, pulse < 100.    - MODERATE: SOB at rest, SOB with minimal exertion and prefers to sit, cannot lie down flat, speaks in phrases, mild retractions, audible wheezing, pulse 100-120.    - SEVERE: Very SOB at rest, speaks in single words, struggling to breathe, sitting hunched forward, retractions, pulse > 120       5. RECURRENT SYMPTOM: Have you had difficulty breathing before? If Yes, ask: When was the last time? and What happened that time?       Yes, 2 years and she had Covid 6. CARDIAC HISTORY: Do you have any history of heart disease? (e.g., heart attack, angina, bypass surgery, angioplasty)      *No Answer* 7. LUNG HISTORY: Do you have any history of lung disease?  (e.g., pulmonary embolus, asthma, emphysema)     *No Answer* 8. CAUSE: What do you think is causing the breathing problem?      *No Answer* 9. OTHER SYMPTOMS: Do you have any other symptoms? (e.g., dizziness, runny nose, cough, chest pain, fever)     *No Answer* 10. O2 SATURATION MONITOR:  Do you use an oxygen saturation monitor (pulse oximeter) at home? If Yes, ask: What is your reading (oxygen level) today? What is your usual oxygen saturation reading? (e.g., 95%)       *  No Answer* 11. PREGNANCY: Is there any chance you are pregnant? When was your last menstrual period?       *No Answer* 12. TRAVEL: Have you traveled out of the country in the last month? (e.g., travel history, exposures)       *No Answer*  Protocols used: Breathing Difficulty-A-AH
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
## 2023-01-27 NOTE — Telephone Encounter (Signed)
 The pt went to UC today Thx

## 2023-01-27 NOTE — ED Triage Notes (Signed)
 Pt states coughing ,wheezing and SOB. States she has Covid and finished a zpak and is on steroids with no relief.   States her rescue inhaler helps a little.

## 2023-02-05 ENCOUNTER — Other Ambulatory Visit: Payer: Self-pay | Admitting: Internal Medicine

## 2023-02-13 ENCOUNTER — Telehealth: Payer: Self-pay | Admitting: Internal Medicine

## 2023-02-13 NOTE — Telephone Encounter (Signed)
Copied from CRM [id number]. Topic: General - Other >> Feb 13, 2023 12:37 PM Hector Shade B wrote: Reason for CRM: Nurse April with [facility] called in regarding patient confirming pcp, stating she will be faxing over a form to be sign by pcp, gave her the fax number to be sent over for confirmation of pcp. If you have any questions please call [phone] (April of [facility])

## 2023-02-16 ENCOUNTER — Other Ambulatory Visit
Admission: RE | Admit: 2023-02-16 | Discharge: 2023-02-16 | Disposition: A | Discharge status: Home / Self Care | Payer: Medicare PPO | Source: Ambulatory Visit | Attending: Pulmonary Disease | Admitting: Pulmonary Disease

## 2023-02-16 ENCOUNTER — Encounter: Payer: Self-pay | Admitting: Pulmonary Disease

## 2023-02-16 ENCOUNTER — Ambulatory Visit: Payer: Medicare PPO | Admitting: Pulmonary Disease

## 2023-02-16 VITALS — BP 132/60 | HR 78 | Temp 98.0°F | Ht 61.0 in | Wt 129.0 lb

## 2023-02-16 DIAGNOSIS — J4540 Moderate persistent asthma, uncomplicated: Secondary | ICD-10-CM | POA: Diagnosis not present

## 2023-02-16 LAB — CBC WITH DIFFERENTIAL/PLATELET
Abs Immature Granulocytes: 0.03 10*3/uL (ref 0.00–0.07)
Basophils Absolute: 0 10*3/uL (ref 0.0–0.1)
Basophils Relative: 0 %
Eosinophils Absolute: 0.3 10*3/uL (ref 0.0–0.5)
Eosinophils Relative: 4 %
HCT: 32.5 % — ABNORMAL LOW (ref 36.0–46.0)
Hemoglobin: 11 g/dL — ABNORMAL LOW (ref 12.0–15.0)
Immature Granulocytes: 1 %
Lymphocytes Relative: 40 %
Lymphs Abs: 2.6 10*3/uL (ref 0.7–4.0)
MCH: 30.9 pg (ref 26.0–34.0)
MCHC: 33.8 g/dL (ref 30.0–36.0)
MCV: 91.3 fL (ref 80.0–100.0)
Monocytes Absolute: 0.6 10*3/uL (ref 0.1–1.0)
Monocytes Relative: 9 %
Neutro Abs: 3.1 10*3/uL (ref 1.7–7.7)
Neutrophils Relative %: 46 %
Platelets: 218 10*3/uL (ref 150–400)
RBC: 3.56 MIL/uL — ABNORMAL LOW (ref 3.87–5.11)
RDW: 13.1 % (ref 11.5–15.5)
WBC: 6.6 10*3/uL (ref 4.0–10.5)
nRBC: 0 % (ref 0.0–0.2)

## 2023-02-16 MED ORDER — FLUTICASONE FUROATE-VILANTEROL 200-25 MCG/ACT IN AEPB: 1.0000 | INHALATION_SPRAY | Freq: Every day | RESPIRATORY_TRACT | 3 refills | Status: DC

## 2023-02-16 NOTE — Progress Notes (Signed)
Synopsis: Referred in by Plotnikov, Georgina Quint, MD   Subjective:   PATIENT ID: [name] GENDER: female DOB: [birth year], MRN: [id number]  Chief Complaint  Patient presents with   Consult    HPI Ms. [name] is a pleasant 78 year old female patient with a past medical history of moderate persistent asthma presenting today to the pulmonary office to establish care.  She was diagnosed with asthma for years and has been on Arnuity Ellipta for 3 years.  She uses Atrovent on an as-needed basis.  Viral infections usually flares her asthma symptoms and causes acute bronchitis.  She does report hypersensitivity to strong scents and high humidity.  She was never hospitalized or required intubation for asthma.  She was recently in [place] in January and contracted COVID and has flared up her asthma symptoms, she required multiple rounds of systemic steroids as outpatient as well as azithromycin.  She did improve however she still has lingering cough.  Chest x-ray 01/2023 with no active cardiopulmonary disease.  Family history -cousins in [place] with asthma  Social history -never smoker, drinks alcohol occasionally, denies any illicit drug use.  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  She has 1 dog at home.   ROS All systems were reviewed and are negative except for the above. Objective:   Vitals:   02/16/23 0925  BP: 132/60  Pulse: 78  Temp: 98 F (36.7 C)  SpO2: 98%  Weight: 129 lb (58.5 kg)  Height: 5\' 1"  (1.549 m)   98% on  RA BMI Readings from Last 3 Encounters:  02/16/23 24.37 kg/m  12/03/22 24.34 kg/m  06/02/22 24.00 kg/m   Wt Readings from Last 3 Encounters:  02/16/23 129 lb (58.5 kg)  12/03/22 128 lb 12.8 oz (58.4 kg)  06/02/22 127 lb (57.6 kg)    Physical Exam GEN: NAD, Healthy Appearing HEENT: Supple Neck, Reactive Pupils, EOMI  CVS: Normal S1, Normal S2, RRR, No murmurs or ES appreciated  Lungs: Poor expiratory effort..   Abdomen: Soft, non tender, non distended, + BS  Extremities: Warm and well perfused, No edema  Skin: No suspicious lesions appreciated  Psych: Normal Affect  Ancillary Information   CBC    Component Value Date/Time   WBC 5.6 11/26/2022 0803   RBC 3.76 (L) 11/26/2022 0803   HGB 11.8 (L) 11/26/2022 0803   HCT 35.4 (L) 11/26/2022 0803   PLT 229.0 11/26/2022 0803   MCV 94.3 11/26/2022 0803   MCH 31.3 07/20/2010 1120   MCHC 33.4 11/26/2022 0803   RDW 13.1 11/26/2022 0803   LYMPHSABS 2.9 11/26/2022 0803   MONOABS 0.5 11/26/2022 0803   EOSABS 0.3 11/26/2022 0803   BASOSABS 0.1 11/26/2022 0803   Labs and imaging were reviewed     No data to display           Assessment & Plan:  Ms. [name] is a pleasant 78 year old female patient with a past medical history of moderate persistent asthma presenting today to the pulmonary office to establish care.  # Moderate persistent asthma Flared by COVID-19 in January.  Currently with acute bronchitis.  I reassured her that this this will resolve.  She is already improving.  I will change her inhalers to include ICS/LABA.  Will phenotype her asthma and obtain pulmonary function test.  []  Start fluticasone-vilanterol [Breo Ellipta] 200 1 puff daily. []  Continue Atrovent 2 puffs every 6 hours as needed.  She is allergic to albuterol. []  PFTs []   Allergen panel and CBC with differential.  Return in about 3 months (around 05/16/2023).  I spent 60 minutes caring for this patient today, including preparing to see the patient, obtaining a medical history , reviewing a separately obtained history, performing a medically appropriate examination and/or evaluation, counseling and educating the patient/family/caregiver, ordering medications, tests, or procedures, documenting clinical information in the electronic health record, and independently interpreting results (not separately reported/billed) and communicating results to the  patient/family/caregiver  Janann Colonel, MD [facility] 02/16/2023 9:56 AM

## 2023-02-16 NOTE — Addendum Note (Signed)
Addended by: Lonell Face C on: 02/16/2023 10:06 AM   Modules accepted: Orders

## 2023-02-19 ENCOUNTER — Encounter: Payer: Self-pay | Admitting: Pulmonary Disease

## 2023-02-19 LAB — ALLERGEN PANEL (27) + IGE
Alternaria Alternata IgE: <0.1 kU/L
Aspergillus Fumigatus IgE: <0.1 kU/L
Bahia Grass IgE: <0.1 kU/L
Bermuda Grass IgE: <0.1 kU/L
Cat Dander IgE: 10.4 kU/L — AB
Cedar, Mountain IgE: <0.1 kU/L
Cladosporium Herbarum IgE: <0.1 kU/L
Cocklebur IgE: <0.1 kU/L
Cockroach, American IgE: <0.1 kU/L
Common Silver Birch IgE: <0.1 kU/L
D Farinae IgE: <0.1 kU/L
D Pteronyssinus IgE: 0.14 kU/L — AB
Dog Dander IgE: 89.6 kU/L — AB
Elm, American IgE: <0.1 kU/L
Hickory, White IgE: <0.1 kU/L
IgE (Immunoglobulin E), Serum: 788 [IU]/mL — ABNORMAL HIGH (ref 6–495)
Johnson Grass IgE: <0.1 kU/L
Kentucky Bluegrass IgE: <0.1 kU/L
Maple/Box Elder IgE: <0.1 kU/L
Mucor Racemosus IgE: 0.23 kU/L — AB
Oak, White IgE: <0.1 kU/L
Penicillium Chrysogen IgE: 0.13 kU/L — AB
Pigweed, Rough IgE: <0.1 kU/L
Plantain, English IgE: <0.1 kU/L
Ragweed, Short IgE: <0.1 kU/L
Setomelanomma Rostrat: <0.1 kU/L
Timothy Grass IgE: <0.1 kU/L
White Mulberry IgE: <0.1 kU/L

## 2023-02-20 NOTE — Telephone Encounter (Signed)
 The ROV is not until 06/03/23 and we normally schedule the PFT right before you come back

## 2023-02-23 ENCOUNTER — Other Ambulatory Visit (INDEPENDENT_AMBULATORY_CARE_PROVIDER_SITE_OTHER): Payer: Medicare PPO

## 2023-02-23 DIAGNOSIS — E785 Hyperlipidemia, unspecified: Secondary | ICD-10-CM

## 2023-02-23 DIAGNOSIS — R300 Dysuria: Secondary | ICD-10-CM

## 2023-02-23 DIAGNOSIS — I10 Essential (primary) hypertension: Secondary | ICD-10-CM | POA: Diagnosis not present

## 2023-02-23 DIAGNOSIS — R739 Hyperglycemia, unspecified: Secondary | ICD-10-CM | POA: Diagnosis not present

## 2023-02-23 LAB — COMPREHENSIVE METABOLIC PANEL
ALT: 22 U/L (ref 0–35)
AST: 18 U/L (ref 0–37)
Albumin: 3.8 g/dL (ref 3.5–5.2)
Alkaline Phosphatase: 41 U/L (ref 39–117)
BUN: 24 mg/dL — ABNORMAL HIGH (ref 6–23)
CO2: 26 meq/L (ref 19–32)
Calcium: 8.7 mg/dL (ref 8.4–10.5)
Chloride: 108 meq/L (ref 96–112)
Creatinine, Ser: 1.12 mg/dL (ref 0.40–1.20)
GFR: 48.05 mL/min — ABNORMAL LOW (ref >60.00)
Glucose, Bld: 99 mg/dL (ref 70–99)
Potassium: 4.4 meq/L (ref 3.5–5.1)
Sodium: 142 meq/L (ref 135–145)
Total Bilirubin: 0.3 mg/dL (ref 0.2–1.2)
Total Protein: 6.4 g/dL (ref 6.0–8.3)

## 2023-02-23 LAB — URINALYSIS
Bilirubin Urine: NEGATIVE
Hgb urine dipstick: NEGATIVE
Ketones, ur: NEGATIVE
Leukocytes,Ua: NEGATIVE
Nitrite: NEGATIVE
Specific Gravity, Urine: 1.02 (ref 1.000–1.030)
Total Protein, Urine: NEGATIVE
Urine Glucose: NEGATIVE
Urobilinogen, UA: 0.2 (ref 0.0–1.0)
pH: 7 (ref 5.0–8.0)

## 2023-02-23 LAB — CBC WITH DIFFERENTIAL/PLATELET
Basophils Absolute: 0 10*3/uL (ref 0.0–0.1)
Basophils Relative: 0.8 % (ref 0.0–3.0)
Eosinophils Absolute: 0.4 10*3/uL (ref 0.0–0.7)
Eosinophils Relative: 7.9 % — ABNORMAL HIGH (ref 0.0–5.0)
HCT: 31.1 % — ABNORMAL LOW (ref 36.0–46.0)
Hemoglobin: 10.5 g/dL — ABNORMAL LOW (ref 12.0–15.0)
Lymphocytes Relative: 54.5 % — ABNORMAL HIGH (ref 12.0–46.0)
Lymphs Abs: 3.1 10*3/uL (ref 0.7–4.0)
MCHC: 33.6 g/dL (ref 30.0–36.0)
MCV: 94.7 fL (ref 78.0–100.0)
Monocytes Absolute: 0.5 10*3/uL (ref 0.1–1.0)
Monocytes Relative: 8.3 % (ref 3.0–12.0)
Neutro Abs: 1.6 10*3/uL (ref 1.4–7.7)
Neutrophils Relative %: 28.5 % — ABNORMAL LOW (ref 43.0–77.0)
Platelets: 234 10*3/uL (ref 150.0–400.0)
RBC: 3.29 Mil/uL — ABNORMAL LOW (ref 3.87–5.11)
RDW: 13.9 % (ref 11.5–15.5)
WBC: 5.6 10*3/uL (ref 4.0–10.5)

## 2023-02-23 LAB — LIPID PANEL
Cholesterol: 184 mg/dL (ref 0–200)
HDL: 81.7 mg/dL (ref >39.00)
LDL Cholesterol: 87 mg/dL (ref 0–99)
NonHDL: 101.82
Total CHOL/HDL Ratio: 2
Triglycerides: 75 mg/dL (ref 0.0–149.0)
VLDL: 15 mg/dL (ref 0.0–40.0)

## 2023-02-23 LAB — HEMOGLOBIN A1C: Hgb A1c MFr Bld: 6.4 % (ref 4.6–6.5)

## 2023-02-23 LAB — T4, FREE: Free T4: 0.53 ng/dL — ABNORMAL LOW (ref 0.60–1.60)

## 2023-02-23 LAB — TSH: TSH: 5.07 u[IU]/mL (ref 0.35–5.50)

## 2023-02-24 ENCOUNTER — Encounter: Payer: Self-pay | Admitting: Internal Medicine

## 2023-02-24 ENCOUNTER — Ambulatory Visit: Payer: Medicare PPO

## 2023-02-24 ENCOUNTER — Ambulatory Visit (INDEPENDENT_AMBULATORY_CARE_PROVIDER_SITE_OTHER): Payer: Medicare PPO | Admitting: Internal Medicine

## 2023-02-24 VITALS — BP 118/70 | HR 80 | Ht 61.0 in | Wt 131.0 lb

## 2023-02-24 VITALS — BP 118/70 | Ht 61.0 in | Wt 131.0 lb

## 2023-02-24 DIAGNOSIS — Z8616 Personal history of COVID-19: Secondary | ICD-10-CM | POA: Diagnosis not present

## 2023-02-24 DIAGNOSIS — Z0000 Encounter for general adult medical examination without abnormal findings: Secondary | ICD-10-CM

## 2023-02-24 DIAGNOSIS — J4521 Mild intermittent asthma with (acute) exacerbation: Secondary | ICD-10-CM | POA: Diagnosis not present

## 2023-02-24 DIAGNOSIS — R5383 Other fatigue: Secondary | ICD-10-CM | POA: Diagnosis not present

## 2023-02-24 DIAGNOSIS — J4540 Moderate persistent asthma, uncomplicated: Secondary | ICD-10-CM | POA: Diagnosis not present

## 2023-02-24 DIAGNOSIS — R7989 Other specified abnormal findings of blood chemistry: Secondary | ICD-10-CM

## 2023-02-24 DIAGNOSIS — D649 Anemia, unspecified: Secondary | ICD-10-CM

## 2023-02-24 MED ORDER — LEVOTHYROXINE SODIUM 25 MCG PO TABS: 25.0000 ug | ORAL_TABLET | Freq: Every day | ORAL | 5 refills | Status: DC

## 2023-02-24 MED ORDER — PROMETHAZINE-DM 6.25-15 MG/5ML PO SYRP: 5.0000 mL | ORAL_SOLUTION | Freq: Four times a day (QID) | ORAL | 0 refills | Status: DC | PRN

## 2023-02-24 NOTE — Patient Instructions (Signed)
For a mild COVID-19 case - take zinc 50 mg a day for 1 week, vitamin C 1000 mg daily for 1 week, vitamin D2 50,000 units weekly for 2 months (unless  taking vitamin D daily already), an antioxidant Quercetin 500 mg twice a day for 1 week (if you can get it quick enough). Take Allegra or Benadryl.  Maintain good oral hydration and take Tylenol for high fever.  Call if problems. Isolate for 5 days, then wear a mask for 5 days per CDC.

## 2023-02-24 NOTE — Progress Notes (Signed)
Subjective:  Patient ID: [name], female    DOB: [birth year]  Age: 78 y.o. MRN: [id number]  CC: Annual Exam   HPI [name] presents for a well exam F/u on asthma, PAD, dyslipidemia Pt had COVID 19 in Jan 2025 - bad; finished steroids; very tired  Outpatient Medications Prior to Visit  Medication Sig Dispense Refill   aspirin EC 81 MG tablet Take 81 mg by mouth daily.      atorvastatin (LIPITOR) 10 MG tablet TAKE 1 TABLET BY MOUTH FOUR TIMES A WEEK. 90 tablet 3   Calcium Citrate (CITRACAL PO) Take 1 capsule by mouth daily.      cetirizine (ZYRTEC) 10 MG tablet Take 10 mg by mouth as needed for allergies.     Cholecalciferol (VITAMIN D3) 50 MCG (2000 UT) capsule Take 1 capsule (2,000 Units total) by mouth daily. 100 capsule 3   cyclobenzaprine (FLEXERIL) 10 MG tablet Take 1 tablet (10 mg total) by mouth 2 (two) times daily as needed. 60 tablet 1   EPINEPHrine 0.3 mg/0.3 mL IJ SOAJ injection USE AS DIRECTED 2 each 0   estradiol (ESTRACE) 0.1 MG/GM vaginal cream Place vaginally.     Fluticasone Furoate (ARNUITY ELLIPTA) 100 MCG/ACT AEPB INHALE 1 PUFF BY MOUTH EVERY DAY 3 each 3   Fluticasone Furoate-Vilanterol (BREO ELLIPTA IN) Inhale into the lungs.     fluticasone furoate-vilanterol (BREO ELLIPTA) 200-25 MCG/ACT AEPB Inhale 1 puff into the lungs daily. 180 each 3   fluticasone furoate-vilanterol (BREO ELLIPTA) 200-25 MCG/ACT AEPB Inhale 1 puff into the lungs daily. 30 each 3   ipratropium (ATROVENT HFA) 17 MCG/ACT inhaler Inhale 2 puffs into the lungs every 4 (four) hours as needed for wheezing. 1 each 5   methylPREDNISolone (MEDROL DOSEPAK) 4 MG TBPK tablet As directed 21 tablet 0   olmesartan (BENICAR) 40 MG tablet TAKE 1 TABLET BY MOUTH DAILY 90 tablet 3   omega-3 acid ethyl esters (LOVAZA) 1 g capsule Take 2 capsules (2 g total) by mouth 2 (two) times daily. 120 capsule 11   ondansetron (ZOFRAN) 4 MG tablet Take 1 tablet (4 mg total) by mouth every 8 (eight) hours as needed  for nausea or vomiting. 20 tablet 0   Pediatric Multiple Vit-C-FA (MULTIVITAMIN ANIMAL SHAPES, WITH CA/FA,) WITH C & FA CHEW Chew 1 tablet by mouth daily.     tretinoin (RETIN-A) 0.05 % cream      No facility-administered medications prior to visit.    ROS: Review of Systems  Constitutional:  Positive for fatigue. Negative for activity change, appetite change, chills and unexpected weight change.  HENT:  Negative for congestion, mouth sores and sinus pressure.   Eyes:  Negative for visual disturbance.  Respiratory:  Negative for cough and chest tightness.   Gastrointestinal:  Negative for abdominal pain and nausea.  Genitourinary:  Negative for difficulty urinating, frequency and vaginal pain.  Musculoskeletal:  Negative for back pain and gait problem.  Skin:  Negative for pallor and rash.  Neurological:  Negative for dizziness, tremors, weakness, numbness and headaches.  Psychiatric/Behavioral:  Negative for confusion and sleep disturbance.     Objective:  BP 118/70 (BP Location: Left Arm, Patient Position: Sitting, Cuff Size: Normal)   Pulse 80   Ht 5\' 1"  (1.549 m)   Wt 131 lb (59.4 kg)   SpO2 94%   BMI 24.75 kg/m   BP Readings from Last 3 Encounters:  02/24/23 118/70  02/16/23 132/60  01/27/23 (!) 176/67  Wt Readings from Last 3 Encounters:  02/24/23 131 lb (59.4 kg)  02/16/23 129 lb (58.5 kg)  12/03/22 128 lb 12.8 oz (58.4 kg)    Physical Exam Constitutional:      General: She is not in acute distress.    Appearance: Normal appearance. She is well-developed.  HENT:     Head: Normocephalic.     Right Ear: External ear normal.     Left Ear: External ear normal.     Nose: Nose normal.  Eyes:     General:        Right eye: No discharge.        Left eye: No discharge.     Conjunctiva/sclera: Conjunctivae normal.     Pupils: Pupils are equal, round, and reactive to light.  Neck:     Thyroid: No thyromegaly.     Vascular: No JVD.     Trachea: No tracheal  deviation.  Cardiovascular:     Rate and Rhythm: Normal rate and regular rhythm.     Heart sounds: Normal heart sounds.  Pulmonary:     Effort: No respiratory distress.     Breath sounds: No stridor. No wheezing.  Abdominal:     General: Bowel sounds are normal. There is no distension.     Palpations: Abdomen is soft. There is no mass.     Tenderness: There is no abdominal tenderness. There is no guarding or rebound.  Musculoskeletal:        General: No tenderness.     Cervical back: Normal range of motion and neck supple. No rigidity.  Lymphadenopathy:     Cervical: No cervical adenopathy.  Skin:    Findings: No erythema or rash.  Neurological:     Cranial Nerves: No cranial nerve deficit.     Motor: No abnormal muscle tone.     Coordination: Coordination normal.     Deep Tendon Reflexes: Reflexes normal.  Psychiatric:        Behavior: Behavior normal.        Thought Content: Thought content normal.        Judgment: Judgment normal.     Lab Results  Component Value Date   WBC 5.6 02/23/2023   HGB 10.5 (L) 02/23/2023   HCT 31.1 (L) 02/23/2023   PLT 234.0 02/23/2023   GLUCOSE 99 02/23/2023   CHOL 184 02/23/2023   TRIG 75.0 02/23/2023   HDL 81.70 02/23/2023   LDLDIRECT 126.2 10/06/2012   LDLCALC 87 02/23/2023   ALT 22 02/23/2023   AST 18 02/23/2023   NA 142 02/23/2023   K 4.4 02/23/2023   CL 108 02/23/2023   CREATININE 1.12 02/23/2023   BUN 24 (H) 02/23/2023   CO2 26 02/23/2023   TSH 5.07 02/23/2023   INR 0.95 07/20/2010   HGBA1C 6.4 02/23/2023    No results found.  Assessment & Plan:   Problem List Items Addressed This Visit     Asthma   Worse after COVIDAdvised to take Claritin daily Continue with Arnuity Ellipta      Relevant Medications   Fluticasone Furoate-Vilanterol (BREO ELLIPTA IN)   RESOLVED: Well adult exam - Primary   We discussed age appropriate health related issues, including available/recomended screening tests and vaccinations. We  discussed a need for adhering to healthy diet and exercise. Labs were ordered to be later reviewed . All questions were answered.        Asthmatic bronchitis   Stable.  Continue with Arnuity Ellipta  Relevant Medications   Fluticasone Furoate-Vilanterol (BREO ELLIPTA IN)   Abnormal TSH   Monitor TSH, FT4, FT3 Start Levothyroxine 25 mcg/d      Fatigue   Check FT4, FT3, TSH      History of COVID-19   C/o fatigue         Meds ordered this encounter  Medications   promethazine-dextromethorphan (PROMETHAZINE-DM) 6.25-15 MG/5ML syrup    Sig: Take 5 mLs by mouth 4 (four) times daily as needed for cough.    Dispense:  240 mL    Refill:  0   levothyroxine (SYNTHROID) 25 MCG tablet    Sig: Take 1 tablet (25 mcg total) by mouth daily.    Dispense:  30 tablet    Refill:  5      Follow-up: Return in about 3 months (around 05/24/2023) for a follow-up visit.  Sonda Primes, MD

## 2023-02-24 NOTE — Assessment & Plan Note (Signed)
Worse after COVIDAdvised to take Claritin daily Continue with Arnuity Ellipta

## 2023-02-24 NOTE — Assessment & Plan Note (Signed)
Stable.  Continue with Arnuity Ellipta

## 2023-02-24 NOTE — Assessment & Plan Note (Signed)
C/o fatigue

## 2023-02-24 NOTE — Patient Instructions (Addendum)
Ms. [name] , Thank you for taking time to come for your Medicare Wellness Visit. I appreciate your ongoing commitment to your health goals. Please review the following plan we discussed and let me know if I can assist you in the future.   Referrals/Orders/Follow-Ups/Clinician Recommendations: Aim for 30 minutes of exercise or brisk walking, 6-8 glasses of water, and 5 servings of fruits and vegetables each day. Patient plans to have repeat DEXA scan in 03/2023 with Gynecologist.    This is a list of the screening recommended for you and due dates:  Health Maintenance  Topic Date Due   COVID-19 Vaccine (4 - 2024-25 season) 09/14/2022   DTaP/Tdap/Td vaccine (3 - Td or Tdap) 10/14/2023   Medicare Annual Wellness Visit  02/24/2024   Colon Cancer Screening  05/14/2031   Pneumonia Vaccine  Completed   Flu Shot  Completed   DEXA scan (bone density measurement)  Completed   Hepatitis C Screening  Completed   Zoster (Shingles) Vaccine  Completed   HPV Vaccine  Aged Out    Advanced directives: (Provided) Advance directive discussed with you today. I have provided a copy for you to complete at home and have notarized. Once this is complete, please bring a copy in to our office so we can scan it into your chart.   Next Medicare Annual Wellness Visit scheduled for next year: Yes - 02/25/2024

## 2023-02-24 NOTE — Assessment & Plan Note (Signed)
We discussed age appropriate health related issues, including available/recomended screening tests and vaccinations. We discussed a need for adhering to healthy diet and exercise. Labs were ordered to be later reviewed . All questions were answered.

## 2023-02-24 NOTE — Assessment & Plan Note (Addendum)
Monitor TSH, FT4, FT3 Start Levothyroxine 25 mcg/d

## 2023-02-24 NOTE — Assessment & Plan Note (Signed)
Check FT4, FT3, TSH
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
## 2023-02-25 NOTE — Telephone Encounter (Signed)
Fax has been sent.

## 2023-02-25 NOTE — Telephone Encounter (Signed)
Copied from CRM [id number]. Topic: Clinical - Medical Advice >> Feb 25, 2023 12:12 PM Chantha C wrote: Reason for CRM: April nurse from [facility] [phone]  is checking on a faxed 02/13/23 about osteoporosis management trying to get a bone density.

## 2023-02-27 DIAGNOSIS — I10 Essential (primary) hypertension: Secondary | ICD-10-CM | POA: Diagnosis not present

## 2023-02-27 DIAGNOSIS — Q613 Polycystic kidney, unspecified: Secondary | ICD-10-CM | POA: Diagnosis not present

## 2023-02-27 DIAGNOSIS — D638 Anemia in other chronic diseases classified elsewhere: Secondary | ICD-10-CM | POA: Diagnosis not present

## 2023-02-27 DIAGNOSIS — N1832 Chronic kidney disease, stage 3b: Secondary | ICD-10-CM | POA: Diagnosis not present

## 2023-03-04 ENCOUNTER — Telehealth: Payer: Self-pay | Admitting: Emergency Medicine

## 2023-03-04 NOTE — Telephone Encounter (Signed)
 Please see last signed [facility] sent to [facility]. PT called and still has no answer. Her # is [phone]

## 2023-03-04 NOTE — Telephone Encounter (Signed)
 I am currently waiting for the PFT schedule for March and I have over 85 patients to schedule that currently have appts in March

## 2023-03-12 DIAGNOSIS — M25571 Pain in right ankle and joints of right foot: Secondary | ICD-10-CM | POA: Diagnosis not present

## 2023-03-12 DIAGNOSIS — M79671 Pain in right foot: Secondary | ICD-10-CM | POA: Diagnosis not present

## 2023-03-12 DIAGNOSIS — W010XXD Fall on same level from slipping, tripping and stumbling without subsequent striking against object, subsequent encounter: Secondary | ICD-10-CM | POA: Diagnosis not present

## 2023-03-12 DIAGNOSIS — S82832D Other fracture of upper and lower end of left fibula, subsequent encounter for closed fracture with routine healing: Secondary | ICD-10-CM | POA: Diagnosis not present

## 2023-03-12 NOTE — Telephone Encounter (Signed)
 Patient saw Dr. Larinda Buttery and the PFT order was placed to be done at [facility] I have left a message 2 times asking the patient to call and schedule PFT whether it be in [place] or [facility]

## 2023-03-13 ENCOUNTER — Other Ambulatory Visit: Payer: Self-pay

## 2023-03-13 DIAGNOSIS — J4540 Moderate persistent asthma, uncomplicated: Secondary | ICD-10-CM

## 2023-03-13 NOTE — Telephone Encounter (Signed)
 I have spoke with the patient and she wants to get her PFT done in [place]. We are scheduling the PFT at [facility] on 03/24/23 @1 :00pm

## 2023-03-18 DIAGNOSIS — R3915 Urgency of urination: Secondary | ICD-10-CM | POA: Diagnosis not present

## 2023-03-18 DIAGNOSIS — Z1231 Encounter for screening mammogram for malignant neoplasm of breast: Secondary | ICD-10-CM | POA: Diagnosis not present

## 2023-03-18 DIAGNOSIS — Z803 Family history of malignant neoplasm of breast: Secondary | ICD-10-CM | POA: Diagnosis not present

## 2023-03-18 DIAGNOSIS — N393 Stress incontinence (female) (male): Secondary | ICD-10-CM | POA: Diagnosis not present

## 2023-03-18 DIAGNOSIS — N952 Postmenopausal atrophic vaginitis: Secondary | ICD-10-CM | POA: Diagnosis not present

## 2023-03-18 DIAGNOSIS — N8189 Other female genital prolapse: Secondary | ICD-10-CM | POA: Diagnosis not present

## 2023-03-18 DIAGNOSIS — M85852 Other specified disorders of bone density and structure, left thigh: Secondary | ICD-10-CM | POA: Diagnosis not present

## 2023-03-19 DIAGNOSIS — M85852 Other specified disorders of bone density and structure, left thigh: Secondary | ICD-10-CM | POA: Diagnosis not present

## 2023-03-21 ENCOUNTER — Other Ambulatory Visit: Payer: Self-pay | Admitting: Internal Medicine

## 2023-03-21 DIAGNOSIS — Z1231 Encounter for screening mammogram for malignant neoplasm of breast: Secondary | ICD-10-CM | POA: Diagnosis not present

## 2023-03-24 ENCOUNTER — Institutional Professional Consult (permissible substitution) (HOSPITAL_BASED_OUTPATIENT_CLINIC_OR_DEPARTMENT_OTHER): Payer: Medicare PPO | Admitting: Pulmonary Disease

## 2023-03-24 ENCOUNTER — Ambulatory Visit (HOSPITAL_COMMUNITY)
Admission: RE | Admit: 2023-03-24 | Discharge: 2023-03-24 | Disposition: A | Discharge status: Home / Self Care | Payer: Medicare PPO | Source: Ambulatory Visit | Attending: Pulmonary Disease | Admitting: Pulmonary Disease

## 2023-03-24 DIAGNOSIS — J4540 Moderate persistent asthma, uncomplicated: Secondary | ICD-10-CM | POA: Diagnosis not present

## 2023-03-24 LAB — PULMONARY FUNCTION TEST
DL/VA % pred: 64 %
DL/VA: 2.71 ml/min/mmHg/L
DLCO unc % pred: 70 %
DLCO unc: 11.67 ml/min/mmHg
FEF 25-75 Pre: 1.54 L/s
FEF2575-%Pred-Pre: 107 %
FEV1-%Pred-Pre: 122 %
FEV1-Pre: 2.13 L
FEV1FVC-%Pred-Pre: 97 %
FEV6-%Pred-Pre: 129 %
FEV6-Pre: 2.87 L
FEV6FVC-%Pred-Pre: 103 %
FVC-%Pred-Pre: 125 %
FVC-Pre: 2.93 L
Pre FEV1/FVC ratio: 73 %
Pre FEV6/FVC Ratio: 98 %
RV % pred: 137 %
RV: 2.87 L
TLC % pred: 131 %
TLC: 5.84 L

## 2023-03-25 DIAGNOSIS — M6281 Muscle weakness (generalized): Secondary | ICD-10-CM | POA: Diagnosis not present

## 2023-03-25 DIAGNOSIS — S93401D Sprain of unspecified ligament of right ankle, subsequent encounter: Secondary | ICD-10-CM | POA: Diagnosis not present

## 2023-03-25 DIAGNOSIS — M25671 Stiffness of right ankle, not elsewhere classified: Secondary | ICD-10-CM | POA: Diagnosis not present

## 2023-04-08 NOTE — Telephone Encounter (Signed)
 Error

## 2023-04-09 ENCOUNTER — Encounter: Payer: Self-pay | Admitting: Emergency Medicine

## 2023-04-09 ENCOUNTER — Ambulatory Visit: Payer: Medicare PPO | Admitting: Emergency Medicine

## 2023-04-09 VITALS — BP 128/66 | HR 76 | Ht 60.0 in | Wt 129.0 lb

## 2023-04-09 DIAGNOSIS — J309 Allergic rhinitis, unspecified: Secondary | ICD-10-CM | POA: Diagnosis not present

## 2023-04-09 DIAGNOSIS — J4521 Mild intermittent asthma with (acute) exacerbation: Secondary | ICD-10-CM

## 2023-04-09 NOTE — Assessment & Plan Note (Addendum)
 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  Continue her cetirizine, Nasacort as ordered.  Question whether she may require immunotherapy going forward depending on whether we can get her back to baseline.  Biologic therapy would be a consideration also.

## 2023-04-09 NOTE — Assessment & Plan Note (Signed)
 Her symptoms that she is currently dealing with sound more upper airway in nature.  Her lungs are clear bilaterally.  I wonder if the Virgel Bouquet is actually causing some paradoxical upper airway irritation.  I will switch her back to Arnuity to see if she can get back to baseline.  Also continue to treat allergic rhinitis aggressively.  She denies any significant GERD.  Her pulmonary function testing does not show overt obstruction but does show some hyperinflation.  Also her diffusion capacity was decreased, unclear whether this is significant or accurate finding.  We will perform a walking oximetry today to better assess   Please stop your Breo and go back to Arnuity once daily. Keep Atrovent HFA available to use 2 puffs up to every 6 hours if needed for shortness of breath, chest tightness, wheezing. Follow with Dr Delton Coombes in 3 months or sooner if you have any problems.
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
## 2023-04-09 NOTE — Patient Instructions (Signed)
 Please stop your Breo and go back to Arnuity once daily. Keep Atrovent HFA available to use 2 puffs up to every 6 hours if needed for shortness of breath, chest tightness, wheezing. Continue your cetirizine (Zyrtec) once daily Continue Nasacort 2 sprays each nostril once daily. We will perform a walking oximetry test on room air today. Follow with Dr Delton Coombes in 3 months or sooner if you have any problems.

## 2023-04-14 ENCOUNTER — Encounter: Payer: Self-pay | Admitting: Internal Medicine

## 2023-04-15 DIAGNOSIS — M25671 Stiffness of right ankle, not elsewhere classified: Secondary | ICD-10-CM | POA: Diagnosis not present

## 2023-04-15 DIAGNOSIS — M6281 Muscle weakness (generalized): Secondary | ICD-10-CM | POA: Diagnosis not present

## 2023-04-15 DIAGNOSIS — S93401D Sprain of unspecified ligament of right ankle, subsequent encounter: Secondary | ICD-10-CM | POA: Diagnosis not present

## 2023-04-16 NOTE — Telephone Encounter (Signed)
 Patient notes they have newly started elipta as well, not sure if this is what is causing the palpitations or the levothyroxine.

## 2023-04-16 NOTE — Telephone Encounter (Signed)
 Addressing in separate mychart message.

## 2023-04-16 NOTE — Telephone Encounter (Signed)
 Addressed this with patient and she noted that she symptoms were only off and on throughout the day. Informed her recommendation for visit with [place] at the office for EKG but she had declined. Patient noted she was afraid the EKG would not be able to pick up palpitations due to them being intermittent. She will be following up with Dr.Plotnikov for the end of the month. Informed her to follow current recommendations and inform us of any symptom change.

## 2023-05-05 DIAGNOSIS — S93401D Sprain of unspecified ligament of right ankle, subsequent encounter: Secondary | ICD-10-CM | POA: Diagnosis not present

## 2023-05-05 DIAGNOSIS — M6281 Muscle weakness (generalized): Secondary | ICD-10-CM | POA: Diagnosis not present

## 2023-05-05 DIAGNOSIS — M25671 Stiffness of right ankle, not elsewhere classified: Secondary | ICD-10-CM | POA: Diagnosis not present

## 2023-05-06 ENCOUNTER — Ambulatory Visit: Payer: Medicare PPO | Admitting: Internal Medicine

## 2023-05-13 DIAGNOSIS — N1832 Chronic kidney disease, stage 3b: Secondary | ICD-10-CM | POA: Diagnosis not present

## 2023-05-13 DIAGNOSIS — I10 Essential (primary) hypertension: Secondary | ICD-10-CM | POA: Diagnosis not present

## 2023-05-13 DIAGNOSIS — Z8639 Personal history of other endocrine, nutritional and metabolic disease: Secondary | ICD-10-CM | POA: Diagnosis not present

## 2023-05-13 DIAGNOSIS — D638 Anemia in other chronic diseases classified elsewhere: Secondary | ICD-10-CM | POA: Diagnosis not present

## 2023-05-13 DIAGNOSIS — Q613 Polycystic kidney, unspecified: Secondary | ICD-10-CM | POA: Diagnosis not present

## 2023-06-01 ENCOUNTER — Other Ambulatory Visit

## 2023-06-01 DIAGNOSIS — R7989 Other specified abnormal findings of blood chemistry: Secondary | ICD-10-CM

## 2023-06-01 DIAGNOSIS — D649 Anemia, unspecified: Secondary | ICD-10-CM

## 2023-06-01 DIAGNOSIS — Z8616 Personal history of COVID-19: Secondary | ICD-10-CM | POA: Diagnosis not present

## 2023-06-01 LAB — CBC WITH DIFFERENTIAL/PLATELET
Basophils Absolute: 0.1 10*3/uL (ref 0.0–0.1)
Basophils Relative: 1.1 % (ref 0.0–3.0)
Eosinophils Absolute: 0.3 10*3/uL (ref 0.0–0.7)
Eosinophils Relative: 5.3 % — ABNORMAL HIGH (ref 0.0–5.0)
HCT: 34.8 % — ABNORMAL LOW (ref 36.0–46.0)
Hemoglobin: 11.7 g/dL — ABNORMAL LOW (ref 12.0–15.0)
Lymphocytes Relative: 54 % — ABNORMAL HIGH (ref 12.0–46.0)
Lymphs Abs: 3 10*3/uL (ref 0.7–4.0)
MCHC: 33.6 g/dL (ref 30.0–36.0)
MCV: 93.1 fl (ref 78.0–100.0)
Monocytes Absolute: 0.6 10*3/uL (ref 0.1–1.0)
Monocytes Relative: 10.5 % (ref 3.0–12.0)
Neutro Abs: 1.6 10*3/uL (ref 1.4–7.7)
Neutrophils Relative %: 29.1 % — ABNORMAL LOW (ref 43.0–77.0)
Platelets: 226 10*3/uL (ref 150.0–400.0)
RBC: 3.74 Mil/uL — ABNORMAL LOW (ref 3.87–5.11)
RDW: 13.1 % (ref 11.5–15.5)
WBC: 5.6 10*3/uL (ref 4.0–10.5)

## 2023-06-01 LAB — IRON,TIBC AND FERRITIN PANEL
%SAT: 26 % (ref 16–45)
Ferritin: 30 ng/mL (ref 16–288)
Iron: 83 ug/dL (ref 45–160)
TIBC: 314 ug/dL (ref 250–450)

## 2023-06-02 ENCOUNTER — Ambulatory Visit: Admitting: Internal Medicine

## 2023-06-02 ENCOUNTER — Encounter: Payer: Self-pay | Admitting: Internal Medicine

## 2023-06-02 ENCOUNTER — Ambulatory Visit: Payer: Medicare PPO | Admitting: Internal Medicine

## 2023-06-02 ENCOUNTER — Ambulatory Visit: Payer: Self-pay | Admitting: Internal Medicine

## 2023-06-02 VITALS — BP 110/62 | HR 69 | Temp 98.1°F | Ht 60.0 in | Wt 129.0 lb

## 2023-06-02 DIAGNOSIS — J4521 Mild intermittent asthma with (acute) exacerbation: Secondary | ICD-10-CM | POA: Diagnosis not present

## 2023-06-02 DIAGNOSIS — N183 Chronic kidney disease, stage 3 unspecified: Secondary | ICD-10-CM

## 2023-06-02 DIAGNOSIS — D649 Anemia, unspecified: Secondary | ICD-10-CM

## 2023-06-02 DIAGNOSIS — R7989 Other specified abnormal findings of blood chemistry: Secondary | ICD-10-CM

## 2023-06-02 LAB — TSH: TSH: 5.08 u[IU]/mL (ref 0.35–5.50)

## 2023-06-02 LAB — T4, FREE: Free T4: 0.64 ng/dL (ref 0.60–1.60)

## 2023-06-02 LAB — T3, FREE: T3, Free: 2.7 pg/mL (ref 2.3–4.2)

## 2023-06-02 NOTE — Assessment & Plan Note (Signed)
Nephrology follow-up q 6 mo

## 2023-06-02 NOTE — Assessment & Plan Note (Addendum)
 Advised to take Claritin daily Continue with Arnuity Ellipta

## 2023-06-02 NOTE — Assessment & Plan Note (Signed)
 Pending TSH, FT4, FT3 Not on Levothyroxine  25 mcg/d due to palpitations

## 2023-06-02 NOTE — Assessment & Plan Note (Signed)
Due to CRI Iron tests - nl

## 2023-06-02 NOTE — Progress Notes (Signed)
 Subjective:  Patient ID: [name], female    DOB: 23-Nov-1945  Age: 78 y.o. MRN: [id number]  CC: Medical Management of Chronic Issues (Recent lab results and state of health)   HPI [name] presents for dyslipidemia, asthma, HTN, CRI  Outpatient Medications Prior to Visit  Medication Sig Dispense Refill   aspirin EC 81 MG tablet Take 81 mg by mouth daily.      atorvastatin  (LIPITOR) 10 MG tablet TAKE 1 TABLET BY MOUTH FOUR TIMES A WEEK. 90 tablet 3   Calcium  Citrate (CITRACAL PO) Take 1 capsule by mouth daily.      cetirizine (ZYRTEC) 10 MG tablet Take 10 mg by mouth as needed for allergies.     Cholecalciferol (VITAMIN D3) 50 MCG (2000 UT) capsule Take 1 capsule (2,000 Units total) by mouth daily. 100 capsule 3   cyclobenzaprine  (FLEXERIL ) 10 MG tablet Take 1 tablet (10 mg total) by mouth 2 (two) times daily as needed. 60 tablet 1   EPINEPHrine  0.3 mg/0.3 mL IJ SOAJ injection USE AS DIRECTED (Patient not taking: Reported on 06/02/2023) 2 each 0   estradiol (ESTRACE) 0.1 MG/GM vaginal cream Place vaginally.     Fluticasone  Furoate (ARNUITY ELLIPTA ) 100 MCG/ACT AEPB INHALE 1 PUFF BY MOUTH EVERY DAY 3 each 3   Fluticasone  Furoate-Vilanterol (BREO ELLIPTA  IN) Inhale into the lungs.     fluticasone  furoate-vilanterol (BREO ELLIPTA ) 200-25 MCG/ACT AEPB Inhale 1 puff into the lungs daily. 180 each 3   fluticasone  furoate-vilanterol (BREO ELLIPTA ) 200-25 MCG/ACT AEPB Inhale 1 puff into the lungs daily. 30 each 3   ipratropium (ATROVENT  HFA) 17 MCG/ACT inhaler Inhale 2 puffs into the lungs every 4 (four) hours as needed for wheezing. 1 each 5   levothyroxine  (SYNTHROID ) 25 MCG tablet Take 1 tablet by mouth daily. (Patient not taking: Reported on 06/02/2023)     methylPREDNISolone  (MEDROL  DOSEPAK) 4 MG TBPK tablet As directed 21 tablet 0   olmesartan  (BENICAR ) 40 MG tablet TAKE 1 TABLET BY MOUTH DAILY 90 tablet 3   omega-3 acid ethyl esters (LOVAZA ) 1 g capsule TAKE 2 CAPSULES BY MOUTH 2  TIMES A DAY 120 capsule 11   ondansetron  (ZOFRAN ) 4 MG tablet Take 1 tablet (4 mg total) by mouth every 8 (eight) hours as needed for nausea or vomiting. (Patient not taking: Reported on 06/02/2023) 20 tablet 0   Pediatric Multiple Vit-C-FA (MULTIVITAMIN ANIMAL SHAPES, WITH CA/FA,) WITH C & FA CHEW Chew 1 tablet by mouth daily.     promethazine -dextromethorphan (PROMETHAZINE -DM) 6.25-15 MG/5ML syrup Take 5 mLs by mouth 4 (four) times daily as needed for cough. (Patient not taking: Reported on 06/02/2023) 240 mL 0   tretinoin (RETIN-A) 0.05 % cream      levothyroxine  (SYNTHROID ) 25 MCG tablet Take 1 tablet (25 mcg total) by mouth daily. 30 tablet 5   No facility-administered medications prior to visit.    ROS: Review of Systems  Constitutional:  Negative for activity change, appetite change, chills, fatigue and unexpected weight change.  HENT:  Negative for congestion, mouth sores and sinus pressure.   Eyes:  Negative for visual disturbance.  Respiratory:  Negative for cough and chest tightness.   Gastrointestinal:  Negative for abdominal pain and nausea.  Genitourinary:  Negative for difficulty urinating, frequency and vaginal pain.  Musculoskeletal:  Negative for back pain and gait problem.  Skin:  Negative for pallor and rash.  Neurological:  Negative for dizziness, tremors, weakness, numbness and headaches.  Psychiatric/Behavioral:  Negative  for confusion, sleep disturbance and suicidal ideas.     Objective:  BP 110/62   Pulse 69   Temp 98.1 F (36.7 C)   Ht 5' (1.524 m)   Wt 129 lb (58.5 kg)   SpO2 96%   BMI 25.19 kg/m   BP Readings from Last 3 Encounters:  06/02/23 110/62  04/09/23 128/66  02/24/23 118/70    Wt Readings from Last 3 Encounters:  06/02/23 129 lb (58.5 kg)  04/09/23 129 lb (58.5 kg)  02/24/23 131 lb (59.4 kg)    Physical Exam Constitutional:      General: She is not in acute distress.    Appearance: Normal appearance. She is well-developed.  HENT:      Head: Normocephalic.     Right Ear: External ear normal.     Left Ear: External ear normal.     Nose: Nose normal.  Eyes:     General:        Right eye: No discharge.        Left eye: No discharge.     Conjunctiva/sclera: Conjunctivae normal.     Pupils: Pupils are equal, round, and reactive to light.  Neck:     Thyroid : No thyromegaly.     Vascular: No JVD.     Trachea: No tracheal deviation.  Cardiovascular:     Rate and Rhythm: Normal rate and regular rhythm.     Heart sounds: Normal heart sounds.  Pulmonary:     Effort: No respiratory distress.     Breath sounds: No stridor. No wheezing.  Abdominal:     General: Bowel sounds are normal. There is no distension.     Palpations: Abdomen is soft. There is no mass.     Tenderness: There is no abdominal tenderness. There is no guarding or rebound.  Musculoskeletal:        General: No tenderness.     Cervical back: Normal range of motion and neck supple. No rigidity.     Right lower leg: No edema.     Left lower leg: No edema.  Lymphadenopathy:     Cervical: No cervical adenopathy.  Skin:    Findings: No erythema or rash.  Neurological:     Mental Status: She is oriented to person, place, and time.     Cranial Nerves: No cranial nerve deficit.     Motor: No abnormal muscle tone.     Coordination: Coordination normal.     Deep Tendon Reflexes: Reflexes normal.  Psychiatric:        Behavior: Behavior normal.        Thought Content: Thought content normal.        Judgment: Judgment normal.     Lab Results  Component Value Date   WBC 5.6 02/23/2023   HGB 10.5 (L) 02/23/2023   HCT 31.1 (L) 02/23/2023   PLT 234.0 02/23/2023   GLUCOSE 99 02/23/2023   CHOL 184 02/23/2023   TRIG 75.0 02/23/2023   HDL 81.70 02/23/2023   LDLDIRECT 126.2 10/06/2012   LDLCALC 87 02/23/2023   ALT 22 02/23/2023   AST 18 02/23/2023   NA 142 02/23/2023   K 4.4 02/23/2023   CL 108 02/23/2023   CREATININE 1.12 02/23/2023   BUN 24 (H)  02/23/2023   CO2 26 02/23/2023   TSH 5.07 02/23/2023   INR 0.95 07/20/2010   HGBA1C 6.4 02/23/2023    No results found.  Assessment & Plan:   Problem List Items Addressed This Visit  Asthma    Advised to take Claritin  daily Continue with Arnuity Ellipta       Abnormal TSH - Primary   Pending TSH, FT4, FT3 Not on Levothyroxine  25 mcg/d due to palpitations      Anemia, normocytic normochromic   Due to CRI Iron tests - nl      Relevant Orders   Comprehensive metabolic panel with GFR   CBC with Differential/Platelet   Chronic renal insufficiency, stage 3 (moderate) (HCC)   Nephrology follow-up q 6 mo          No orders of the defined types were placed in this encounter.     Follow-up: Return in about 3 months (around 09/02/2023) for a follow-up visit.  Anitra Barn, MD

## 2023-06-03 ENCOUNTER — Ambulatory Visit: Payer: Medicare PPO | Admitting: Pulmonary Disease

## 2023-06-03 ENCOUNTER — Encounter: Payer: Self-pay | Admitting: Pulmonary Disease

## 2023-06-03 VITALS — BP 120/54 | HR 69 | Ht 60.0 in | Wt 128.5 lb

## 2023-06-03 DIAGNOSIS — J4521 Mild intermittent asthma with (acute) exacerbation: Secondary | ICD-10-CM | POA: Diagnosis not present

## 2023-06-03 LAB — NITRIC OXIDE: Nitric Oxide: 20

## 2023-06-03 NOTE — Progress Notes (Signed)
 Synopsis: Referred in by Plotnikov, Oakley Bellman, MD   Subjective:   PATIENT ID: [name] GENDER: female DOB: Jul 29, 1945, MRN: [id number]  Chief Complaint  Patient presents with   Asthma    HPI [name] is a pleasant 78 year old female patient with a past medical history of moderate persistent asthma presenting today to the pulmonary office to establish care.  She was diagnosed with asthma for years and has been on Arnuity Ellipta  for 3 years.  She uses Atrovent  on an as-needed basis.  Viral infections usually flares her asthma symptoms and causes acute bronchitis.  She does report hypersensitivity to strong scents and high humidity.  She was never hospitalized or required intubation for asthma.  She was recently in [place] in January and contracted COVID and has flared up her asthma symptoms, she required multiple rounds of systemic steroids as outpatient as well as azithromycin .  She did improve however she still has lingering cough.  Chest x-ray 01/2023 with no active cardiopulmonary disease.  Labs 02/16/2023: Total IgE 788 with multiple specific IgE sensitivities including cat dander, dog dander, others Relative eosinophils 5.9%, absolute 0.3 Family history -cousins in [place] with asthma  Pulmonary function testing 03/24/2023 reviewed by me shows grossly normal airflows with some evidence for hyperinflation on lung volumes, decreased diffusion capacity. Bronchodilator testing was not done.   Social history -never smoker, drinks alcohol occasionally, denies any illicit drug use.  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  She has 1 dog at home.   OV 06/03/2023 - [name] is here to follow up on her asthma. For a while she has been on Arnuity and most of her symptoms flare during a viral infection. Changed to [facility] in February but then saw Dr. Baldwin Levee in March and was concerned she had paradoxical  upper airway irritation and was advised to continue on  arnuity. But her symptoms resolved after spring and she continued with breo. Does not describe any issues currently. IS only concerned if she gets and infection in the future how severe her symptoms will be.  ROS All systems were reviewed and are negative except for the above. Objective:   Vitals:   06/03/23 1414  BP: (!) 120/54  Pulse: 69  SpO2: 98%  Weight: 128 lb 8 oz (58.3 kg)  Height: 5' (1.524 m)   98% on  RA BMI Readings from Last 3 Encounters:  06/03/23 25.10 kg/m  06/02/23 25.19 kg/m  04/09/23 25.19 kg/m   Wt Readings from Last 3 Encounters:  06/03/23 128 lb 8 oz (58.3 kg)  06/02/23 129 lb (58.5 kg)  04/09/23 129 lb (58.5 kg)    Physical Exam GEN: NAD, Healthy Appearing HEENT: Supple Neck, Reactive Pupils, EOMI  CVS: Normal S1, Normal S2, RRR, No murmurs or ES appreciated  Lungs: Poor expiratory effort..  Abdomen: Soft, non tender, non distended, + BS  Extremities: Warm and well perfused, No edema  Skin: No suspicious lesions appreciated  Psych: Normal Affect  Ancillary Information   CBC    Component Value Date/Time   WBC 5.6 06/01/2023 0818   RBC 3.74 (L) 06/01/2023 0818   HGB 11.7 (L) 06/01/2023 0818   HCT 34.8 (L) 06/01/2023 0818   PLT 226.0 06/01/2023 0818   MCV 93.1 06/01/2023 0818   MCH 30.9 02/16/2023 1008   MCHC 33.6 06/01/2023 0818   RDW 13.1 06/01/2023 0818   LYMPHSABS 3.0 06/01/2023 0818   MONOABS 0.6 06/01/2023 0818   EOSABS  0.3 06/01/2023 0818   BASOSABS 0.1 06/01/2023 0818   Labs and imaging were reviewed    Latest Ref Rng & Units 03/24/2023    1:11 PM  PFT Results  FVC-Pre L 2.93   FVC-Predicted Pre % 125   Pre FEV1/FVC % % 73   FEV1-Pre L 2.13   FEV1-Predicted Pre % 122   DLCO uncorrected ml/min/mmHg 11.67   DLCO UNC% % 70   DLVA Predicted % 64   TLC L 5.84   TLC % Predicted % 131   RV % Predicted % 137      Assessment & Plan:  [name] is a pleasant 78 year old female patient with a past medical history of  moderate persistent asthma presenting today to the pulmonary office to establish care.  # Moderate persistent asthma #Allergic rhinitis.  Flared by COVID-19 in January.  Currently with acute bronchitis.  I reassured her that this this will resolve.  She is already improving.  I will change her inhalers to include ICS/LABA.  Will phenotype her asthma and obtain pulmonary function test. FENO 20 indicating indeterminate for eosinophilic inflammation in the lungs.   []  c/w fluticasone -vilanterol [Breo Ellipta ] 200 1 puff daily. []  Continue Atrovent  2 puffs every 6 hours as needed.  She is allergic to albuterol . []  C/w Nasocort []  I think biologics Xolair vs dupixent will be in her future we'll decide on that on next visit.   RTC 6 months.   I spent 30 minutes caring for this patient today, including preparing to see the patient, obtaining a medical history , reviewing a separately obtained history, performing a medically appropriate examination and/or evaluation, counseling and educating the patient/family/caregiver, ordering medications, tests, or procedures, documenting clinical information in the electronic health record, and independently interpreting results (not separately reported/billed) and communicating results to the patient/family/caregiver  Annitta Kindler, MD [facility] 06/03/2023 2:19 PM

## 2023-06-04 DIAGNOSIS — M25571 Pain in right ankle and joints of right foot: Secondary | ICD-10-CM | POA: Diagnosis not present

## 2023-06-04 DIAGNOSIS — W010XXD Fall on same level from slipping, tripping and stumbling without subsequent striking against object, subsequent encounter: Secondary | ICD-10-CM | POA: Diagnosis not present

## 2023-06-04 DIAGNOSIS — S82832D Other fracture of upper and lower end of left fibula, subsequent encounter for closed fracture with routine healing: Secondary | ICD-10-CM | POA: Diagnosis not present

## 2023-06-05 ENCOUNTER — Telehealth: Payer: Self-pay

## 2023-06-05 NOTE — Telephone Encounter (Signed)
 This patient is appearing on a report for being at risk of failing the adherence measure for cholesterol (statin) medications this calendar year.   Medication: atorvastatin  10 mg four times a week Last fill date: 01/23/23 for 90 tablets for 90 day supply  Pharmacy entered the days supply for the last two fills incorrectly as a fill of 90 tablets would provide a days supply of 157 days when the medication is taken 4x/week. Her next fill date is calculated to be 6/16, however, since the days supply of the previous two fills was entered incorrectly, the patient likely has excess medication at home which will result in her filling the medication past the adherence failure date of 07/03/23. Attempted to call patient to confirm directions. Left voicemail for patient to return call at earliest convenience.  Abelina Abide, PharmD PGY1 Pharmacy Resident 06/05/2023 4:52 PM

## 2023-06-14 ENCOUNTER — Other Ambulatory Visit: Payer: Self-pay | Admitting: Internal Medicine

## 2023-06-20 ENCOUNTER — Encounter (HOSPITAL_COMMUNITY): Payer: Self-pay

## 2023-06-20 ENCOUNTER — Ambulatory Visit (HOSPITAL_COMMUNITY)
Admission: RE | Admit: 2023-06-20 | Discharge: 2023-06-20 | Disposition: A | Discharge status: Home / Self Care | Payer: Self-pay | Source: Ambulatory Visit | Attending: Family Medicine | Admitting: Family Medicine

## 2023-06-20 VITALS — BP 112/67 | HR 81 | Temp 98.7°F | Resp 16

## 2023-06-20 DIAGNOSIS — R002 Palpitations: Secondary | ICD-10-CM

## 2023-06-20 DIAGNOSIS — R1084 Generalized abdominal pain: Secondary | ICD-10-CM | POA: Diagnosis not present

## 2023-06-20 LAB — CBC
HCT: 35.6 % — ABNORMAL LOW (ref 36.0–46.0)
Hemoglobin: 11.9 g/dL — ABNORMAL LOW (ref 12.0–15.0)
MCH: 31.2 pg (ref 26.0–34.0)
MCHC: 33.4 g/dL (ref 30.0–36.0)
MCV: 93.4 fL (ref 80.0–100.0)
Platelets: 237 10*3/uL (ref 150–400)
RBC: 3.81 MIL/uL — ABNORMAL LOW (ref 3.87–5.11)
RDW: 12.9 % (ref 11.5–15.5)
WBC: 3.3 10*3/uL — ABNORMAL LOW (ref 4.0–10.5)
nRBC: 0 % (ref 0.0–0.2)

## 2023-06-20 LAB — COMPREHENSIVE METABOLIC PANEL WITH GFR
ALT: 27 U/L (ref 0–44)
AST: 26 U/L (ref 15–41)
Albumin: 3.7 g/dL (ref 3.5–5.0)
Alkaline Phosphatase: 33 U/L — ABNORMAL LOW (ref 38–126)
Anion gap: 11 (ref 5–15)
BUN: 26 mg/dL — ABNORMAL HIGH (ref 8–23)
CO2: 23 mmol/L (ref 22–32)
Calcium: 9.1 mg/dL (ref 8.9–10.3)
Chloride: 105 mmol/L (ref 98–111)
Creatinine, Ser: 1.51 mg/dL — ABNORMAL HIGH (ref 0.44–1.00)
GFR, Estimated: 36 mL/min — ABNORMAL LOW (ref >60)
Glucose, Bld: 101 mg/dL — ABNORMAL HIGH (ref 70–99)
Potassium: 4.1 mmol/L (ref 3.5–5.1)
Sodium: 139 mmol/L (ref 135–145)
Total Bilirubin: 0.5 mg/dL (ref 0.0–1.2)
Total Protein: 6.7 g/dL (ref 6.5–8.1)

## 2023-06-20 LAB — LIPASE, BLOOD: Lipase: 40 U/L (ref 11–51)

## 2023-06-20 LAB — TSH: TSH: 3.207 u[IU]/mL (ref 0.350–4.500)
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
## 2023-06-20 NOTE — Discharge Instructions (Signed)
 The EKG shows normal sinus rhythm at this point and no atrial fibrillation.  It still could be that you were in atrial fibrillation rhythm when you are having your symptoms.  The EKG also does not show any sign of heart attack when done today.  We have drawn blood to check your blood counts, electrolytes and sugar and kidney and liver function, thyroid  function, and lipase (because of the abdominal pain).  If there is anything significantly abnormal, our staff will notify you.

## 2023-06-20 NOTE — ED Triage Notes (Signed)
 Patient here today with c/o abd pain since yesterday morning. Patient states that yesterday evening she had tachycardia but it feels normal now. Patient states that she feels better but still having some fatigue and SOB with walking up stairs. Patient states that she has chronic kidney disease.

## 2023-06-22 ENCOUNTER — Ambulatory Visit: Payer: Self-pay

## 2023-06-22 ENCOUNTER — Ambulatory Visit (HOSPITAL_COMMUNITY): Payer: Self-pay

## 2023-06-22 NOTE — Telephone Encounter (Signed)
 FYI Only or Action Required?: FYI only for provider  Patient was last seen in primary care on 06/02/2023 by Plotnikov, Oakley Bellman, MD. Called Nurse Triage reporting Abdominal Pain. Symptoms began several days ago. Interventions attempted: Other: Recently Seen In UC on Saturday. Symptoms are: gradually worsening.  Triage Disposition: See Within 3 Days in Office (overriding See Physician Within 24 Hours)  Patient/caregiver understands and will follow disposition?: No   Copied from CRM [id number]. Topic: Clinical - Red Word Triage >> Jun 22, 2023 10:26 AM Magdalene [employer] wrote: Red Word that prompted transfer to Nurse Triage: severe abdominal pain and high heart rate, went to urgent care on 06/20/23 , did EKG and bloodwork, low white blood cells, still has abdominal pain.   Reason for Disposition  [1] MODERATE pain (e.g., interferes with normal activities) AND [2] pain comes and goes (cramps) AND [3] present > 24 hours  (Exception: Pain with Vomiting or Diarrhea - see that Guideline.)  Answer Assessment - Initial Assessment Questions 1. LOCATION: "Where does it hurt?"      Lower Abdomen to Generalized (Diffuse)  2. RADIATION: "Does the pain shoot anywhere else?" (e.g., chest, back)     No  3. ONSET: "When did the pain begin?" (e.g., minutes, hours or days ago)      Since Friday  4. SUDDEN: "Gradual or sudden onset?"     Gradual  5. PATTERN "Does the pain come and go, or is it constant?"    - If it comes and goes: "How long does it last?" "Do you have pain now?"     (Note: Comes and goes means the pain is intermittent. It goes away completely between bouts.)    - If constant: "Is it getting better, staying the same, or getting worse?"      (Note: Constant means the pain never goes away completely; most serious pain is constant and gets worse.)      Intermittent, after eating  6. SEVERITY: "How bad is the pain?"  (e.g., Scale 1-10; mild, moderate, or severe)    - MILD (1-3): Doesn't interfere with  normal activities, abdomen soft and not tender to touch.     - MODERATE (4-7): Interferes with normal activities or awakens from sleep, abdomen tender to touch.     - SEVERE (8-10): Excruciating pain, doubled over, unable to do any normal activities.       Moderate 7. RECURRENT SYMPTOM: "Have you ever had this type of stomach pain before?" If Yes, ask: "When was the last time?" and "What happened that time?"      No  8. CAUSE: "What do you think is causing the stomach pain?"     Unsure  9. RELIEVING/AGGRAVATING FACTORS: "What makes it better or worse?" (e.g., antacids, bending or twisting motion, bowel movement)     Aggravated by eating  10. OTHER SYMPTOMS: "Do you have any other symptoms?" (e.g., back pain, diarrhea, fever, urination pain, vomiting)       Nausea, Tachycardia  11. PREGNANCY: "Is there any chance you are pregnant?" "When was your last menstrual period?"      No and No  Protocols used: Abdominal Pain - [facility]

## 2023-06-24 ENCOUNTER — Ambulatory Visit: Admitting: Internal Medicine

## 2023-06-24 ENCOUNTER — Other Ambulatory Visit: Payer: Self-pay | Admitting: Internal Medicine

## 2023-06-24 ENCOUNTER — Other Ambulatory Visit (INDEPENDENT_AMBULATORY_CARE_PROVIDER_SITE_OTHER)

## 2023-06-24 ENCOUNTER — Encounter: Payer: Self-pay | Admitting: Internal Medicine

## 2023-06-24 VITALS — BP 132/64 | HR 69 | Temp 98.2°F | Ht 60.0 in | Wt 125.0 lb

## 2023-06-24 DIAGNOSIS — R1084 Generalized abdominal pain: Secondary | ICD-10-CM

## 2023-06-24 DIAGNOSIS — N183 Chronic kidney disease, stage 3 unspecified: Secondary | ICD-10-CM

## 2023-06-24 DIAGNOSIS — R002 Palpitations: Secondary | ICD-10-CM

## 2023-06-24 LAB — SEDIMENTATION RATE: Sed Rate: 10 mm/h (ref 0–30)

## 2023-06-24 LAB — CBC WITH DIFFERENTIAL/PLATELET
Basophils Absolute: 0 10*3/uL (ref 0.0–0.1)
Basophils Relative: 0.6 % (ref 0.0–3.0)
Eosinophils Absolute: 0.2 10*3/uL (ref 0.0–0.7)
Eosinophils Relative: 2.5 % (ref 0.0–5.0)
HCT: 36.5 % (ref 36.0–46.0)
Hemoglobin: 12.3 g/dL (ref 12.0–15.0)
Lymphocytes Relative: 43.6 % (ref 12.0–46.0)
Lymphs Abs: 2.8 10*3/uL (ref 0.7–4.0)
MCHC: 33.7 g/dL (ref 30.0–36.0)
MCV: 92.6 fl (ref 78.0–100.0)
Monocytes Absolute: 0.6 10*3/uL (ref 0.1–1.0)
Monocytes Relative: 9.4 % (ref 3.0–12.0)
Neutro Abs: 2.8 10*3/uL (ref 1.4–7.7)
Neutrophils Relative %: 43.9 % (ref 43.0–77.0)
Platelets: 238 10*3/uL (ref 150.0–400.0)
RBC: 3.95 Mil/uL (ref 3.87–5.11)
RDW: 12.7 % (ref 11.5–15.5)
WBC: 6.3 10*3/uL (ref 4.0–10.5)

## 2023-06-24 MED ORDER — CEFUROXIME AXETIL 500 MG PO TABS
500.0000 mg | ORAL_TABLET | Freq: Two times a day (BID) | ORAL | 0 refills | Status: AC
Start: 2023-06-24 — End: 2023-07-04

## 2023-06-24 MED ORDER — ONDANSETRON HCL 4 MG PO TABS: 4.0000 mg | ORAL_TABLET | Freq: Three times a day (TID) | ORAL | 0 refills | Status: AC | PRN

## 2023-06-24 NOTE — Assessment & Plan Note (Signed)
 If we need to get a CT scan of the abdomen for her current abdominal pain, [name] will need to hydrate herself very well before and after the test. We are getting c-Met done today

## 2023-06-24 NOTE — Telephone Encounter (Signed)
 Please schedule office visit with any available provider.  Thank you

## 2023-06-24 NOTE — Assessment & Plan Note (Signed)
 Abdominal pain of unclear etiology.  Pain was severe on Friday.  Differential diagnosis is broad including appendicitis, diverticulitis, gastritis etc.  No signs of bowel obstruction Repeat CBC, c-Met.  Obtain UA and sed rate. Prescribed pantoprazole and Ceftin empirically. Go to ER if worse Consider abdominal CT scan if not well in a day or 2.  Abdominal CT scan would be a challenge due to her chronic renal insufficiency.  [name] will need to hydrate herself well before and after

## 2023-06-24 NOTE — Telephone Encounter (Signed)
Pt is being seen today by PCP.

## 2023-06-24 NOTE — Assessment & Plan Note (Signed)
 Likely PSVT due to abdominal pain.  Resolved

## 2023-06-24 NOTE — Progress Notes (Signed)
 Subjective:  Patient ID: [name], female    DOB: Nov 28, 1945  Age: 78 y.o. MRN: [id number]  CC: Abdominal Pain (Friday extreme bilateral pain her abdomin...Pt. States that her heart would be irrelgur beat for 2- 3 mins will sitting then her heart raced for about l hr on Saturday but than normalized.... Pt. States her WB count was low at UC and EKG was normal.Pt. States she feel lighthead and tired pain level 2.)   HPI [name] presents for abdominal pain for about a week. [name] started to have lower abdominal pain on Tuesday or Wednesday, then the pain disappeared.  Abdominal pain returned on Friday.  It was located in the upper abdomen and later moved to the lower abdomen.  It was severe and [place] stayed in bed all day.  There was no nausea, vomiting, fever.  No diarrhea.  She is chronically constipated. She developed rapid heartbeat/palpitations on Sat 06/20/2023.  She went to ER.  Her lab work was fairly unremarkable.  Palpitations have resolved, abdominal pain continued.  Her appetite is okay.  Outpatient Medications Prior to Visit  Medication Sig Dispense Refill   aspirin EC 81 MG tablet Take 81 mg by mouth daily.      atorvastatin  (LIPITOR) 10 MG tablet TAKE 1 TABLET BY MOUTH FOUR TIMES A WEEK. 90 tablet 3   Calcium  Citrate (CITRACAL PO) Take 1 capsule by mouth daily.      cetirizine (ZYRTEC) 10 MG tablet Take 10 mg by mouth as needed for allergies.     Cholecalciferol (VITAMIN D3) 50 MCG (2000 UT) capsule Take 1 capsule (2,000 Units total) by mouth daily. 100 capsule 3   EPINEPHrine  0.3 mg/0.3 mL IJ SOAJ injection USE AS DIRECTED 2 each 0   estradiol (ESTRACE) 0.1 MG/GM vaginal cream Place vaginally.     Fluticasone  Furoate-Vilanterol (BREO ELLIPTA  IN) Inhale into the lungs.     fluticasone  furoate-vilanterol (BREO ELLIPTA ) 200-25 MCG/ACT AEPB Inhale 1 puff into the lungs daily. 180 each 3   fluticasone  furoate-vilanterol (BREO ELLIPTA ) 200-25 MCG/ACT AEPB Inhale 1 puff into the  lungs daily. 30 each 3   ipratropium (ATROVENT  HFA) 17 MCG/ACT inhaler Inhale 2 puffs into the lungs every 4 (four) hours as needed for wheezing. 1 each 5   olmesartan  (BENICAR ) 40 MG tablet TAKE 1 TABLET BY MOUTH DAILY 90 tablet 3   omega-3 acid ethyl esters (LOVAZA ) 1 g capsule TAKE 2 CAPSULES BY MOUTH TWICE DAILY 120 capsule 11   Pediatric Multiple Vit-C-FA (MULTIVITAMIN ANIMAL SHAPES, WITH CA/FA,) WITH C & FA CHEW Chew 1 tablet by mouth daily.     tretinoin (RETIN-A) 0.05 % cream      No facility-administered medications prior to visit.    ROS: Review of Systems  Constitutional:  Negative for activity change, appetite change, chills, fatigue, fever and unexpected weight change.  HENT:  Negative for congestion, mouth sores and sinus pressure.   Eyes:  Negative for visual disturbance.  Respiratory:  Negative for cough and chest tightness.   Gastrointestinal:  Positive for abdominal pain and constipation. Negative for anal bleeding, blood in stool, diarrhea, nausea, rectal pain and vomiting.  Genitourinary:  Negative for difficulty urinating, frequency and vaginal pain.  Musculoskeletal:  Negative for back pain and gait problem.  Skin:  Negative for color change, pallor and rash.  Neurological:  Negative for dizziness, tremors, weakness, numbness and headaches.  Psychiatric/Behavioral:  Negative for confusion and sleep disturbance. The patient is not nervous/anxious.  Objective:  BP 132/64   Pulse 69   Temp 98.2 F (36.8 C) (Oral)   Ht 5' (1.524 m)   Wt 125 lb (56.7 kg)   SpO2 95%   BMI 24.41 kg/m   BP Readings from Last 3 Encounters:  06/24/23 132/64  06/20/23 112/67  06/03/23 (!) 120/54    Wt Readings from Last 3 Encounters:  06/24/23 125 lb (56.7 kg)  06/03/23 128 lb 8 oz (58.3 kg)  06/02/23 129 lb (58.5 kg)    Physical Exam Constitutional:      General: She is not in acute distress.    Appearance: She is well-developed. She is not ill-appearing or  toxic-appearing.  HENT:     Head: Normocephalic.     Right Ear: External ear normal.     Left Ear: External ear normal.     Nose: Nose normal.  Eyes:     General:        Right eye: No discharge.        Left eye: No discharge.     Conjunctiva/sclera: Conjunctivae normal.     Pupils: Pupils are equal, round, and reactive to light.  Neck:     Thyroid : No thyromegaly.     Vascular: No JVD.     Trachea: No tracheal deviation.  Cardiovascular:     Rate and Rhythm: Normal rate and regular rhythm.     Heart sounds: Normal heart sounds.  Pulmonary:     Effort: No respiratory distress.     Breath sounds: No stridor. No wheezing.  Abdominal:     General: Bowel sounds are normal. There is no distension.     Palpations: Abdomen is soft. There is no mass.     Tenderness: There is abdominal tenderness in the right lower quadrant. There is no guarding or rebound. Negative signs include Murphy's sign.  Musculoskeletal:        General: No tenderness.     Cervical back: Normal range of motion and neck supple. No rigidity.  Lymphadenopathy:     Cervical: No cervical adenopathy.  Skin:    Findings: No erythema or rash.  Neurological:     Cranial Nerves: No cranial nerve deficit.     Motor: No abnormal muscle tone.     Coordination: Coordination normal.     Deep Tendon Reflexes: Reflexes normal.  Psychiatric:        Behavior: Behavior normal.        Thought Content: Thought content normal.        Judgment: Judgment normal.   Abdomen is nondistended, slightly tender in the right lower quadrant.  No rebound symptoms.  Bowel sounds present  Lab Results  Component Value Date   WBC 3.3 (L) 06/20/2023   HGB 11.9 (L) 06/20/2023   HCT 35.6 (L) 06/20/2023   PLT 237 06/20/2023   GLUCOSE 101 (H) 06/20/2023   CHOL 184 02/23/2023   TRIG 75.0 02/23/2023   HDL 81.70 02/23/2023   LDLDIRECT 126.2 10/06/2012   LDLCALC 87 02/23/2023   ALT 27 06/20/2023   AST 26 06/20/2023   NA 139 06/20/2023   K  4.1 06/20/2023   CL 105 06/20/2023   CREATININE 1.51 (H) 06/20/2023   BUN 26 (H) 06/20/2023   CO2 23 06/20/2023   TSH 3.207 06/20/2023   INR 0.95 07/20/2010   HGBA1C 6.4 02/23/2023    No results found.  Assessment & Plan:   Problem List Items Addressed This Visit     Palpitations  Likely PSVT due to abdominal pain.  Resolved      Chronic renal insufficiency, stage 3 (moderate) (HCC)   If we need to get a CT scan of the abdomen for her current abdominal pain, [place] will need to hydrate herself very well before and after the test. We are getting c-Met done today      Generalized abdominal pain - Primary   Abdominal pain of unclear etiology.  Pain was severe on Friday.  Differential diagnosis is broad including appendicitis, diverticulitis, gastritis etc.  No signs of bowel obstruction Repeat CBC, c-Met.  Obtain UA and sed rate. Prescribed pantoprazole and Ceftin empirically. Go to ER if worse Consider abdominal CT scan if not well in a day or 2.  Abdominal CT scan would be a challenge due to her chronic renal insufficiency.  Shiya will need to hydrate herself well before and after      Relevant Orders   DG Abd 2 Views   CBC with Differential/Platelet   Comprehensive metabolic panel with GFR   Sedimentation rate   Urinalysis      Meds ordered this encounter  Medications   cefUROXime (CEFTIN) 500 MG tablet    Sig: Take 1 tablet (500 mg total) by mouth 2 (two) times daily with a meal for 10 days.    Dispense:  20 tablet    Refill:  0   ondansetron  (ZOFRAN ) 4 MG tablet    Sig: Take 1 tablet (4 mg total) by mouth every 8 (eight) hours as needed for nausea or vomiting.    Dispense:  20 tablet    Refill:  0      Follow-up: Return for a follow-up visit.  Anitra Barn, MD

## 2023-06-25 ENCOUNTER — Ambulatory Visit (INDEPENDENT_AMBULATORY_CARE_PROVIDER_SITE_OTHER)
Admission: RE | Admit: 2023-06-25 | Discharge: 2023-06-25 | Disposition: A | Discharge status: Home / Self Care | Source: Ambulatory Visit | Attending: Internal Medicine | Admitting: Internal Medicine

## 2023-06-25 ENCOUNTER — Encounter: Payer: Self-pay | Admitting: Internal Medicine

## 2023-06-25 ENCOUNTER — Ambulatory Visit: Payer: Self-pay | Admitting: Internal Medicine

## 2023-06-25 DIAGNOSIS — R1084 Generalized abdominal pain: Secondary | ICD-10-CM | POA: Diagnosis not present

## 2023-06-25 DIAGNOSIS — K5900 Constipation, unspecified: Secondary | ICD-10-CM | POA: Diagnosis not present

## 2023-06-25 DIAGNOSIS — R109 Unspecified abdominal pain: Secondary | ICD-10-CM | POA: Diagnosis not present

## 2023-06-25 LAB — URINALYSIS
Bilirubin Urine: NEGATIVE
Hgb urine dipstick: NEGATIVE
Ketones, ur: NEGATIVE
Leukocytes,Ua: NEGATIVE
Nitrite: NEGATIVE
Specific Gravity, Urine: 1.01 (ref 1.000–1.030)
Total Protein, Urine: NEGATIVE
Urine Glucose: NEGATIVE
Urobilinogen, UA: 0.2 (ref 0.0–1.0)
pH: 6.5 (ref 5.0–8.0)

## 2023-06-25 LAB — COMPREHENSIVE METABOLIC PANEL WITH GFR
ALT: 26 U/L (ref 0–35)
AST: 23 U/L (ref 0–37)
Albumin: 4.4 g/dL (ref 3.5–5.2)
Alkaline Phosphatase: 39 U/L (ref 39–117)
BUN: 38 mg/dL — ABNORMAL HIGH (ref 6–23)
CO2: 28 meq/L (ref 19–32)
Calcium: 9.9 mg/dL (ref 8.4–10.5)
Chloride: 101 meq/L (ref 96–112)
Creatinine, Ser: 1.61 mg/dL — ABNORMAL HIGH (ref 0.40–1.20)
GFR: 31.02 mL/min — ABNORMAL LOW (ref >60.00)
Glucose, Bld: 103 mg/dL — ABNORMAL HIGH (ref 70–99)
Potassium: 4.5 meq/L (ref 3.5–5.1)
Sodium: 138 meq/L (ref 135–145)
Total Bilirubin: 0.3 mg/dL (ref 0.2–1.2)
Total Protein: 7.4 g/dL (ref 6.0–8.3)

## 2023-08-11 ENCOUNTER — Encounter: Payer: Self-pay | Admitting: Pulmonary Disease

## 2023-08-12 ENCOUNTER — Ambulatory Visit: Admitting: Emergency Medicine

## 2023-08-31 ENCOUNTER — Other Ambulatory Visit (INDEPENDENT_AMBULATORY_CARE_PROVIDER_SITE_OTHER)

## 2023-08-31 DIAGNOSIS — D649 Anemia, unspecified: Secondary | ICD-10-CM | POA: Diagnosis not present

## 2023-08-31 LAB — COMPREHENSIVE METABOLIC PANEL WITH GFR
ALT: 23 U/L (ref 0–35)
AST: 21 U/L (ref 0–37)
Albumin: 3.9 g/dL (ref 3.5–5.2)
Alkaline Phosphatase: 38 U/L — ABNORMAL LOW (ref 39–117)
BUN: 30 mg/dL — ABNORMAL HIGH (ref 6–23)
CO2: 27 meq/L (ref 19–32)
Calcium: 8.9 mg/dL (ref 8.4–10.5)
Chloride: 107 meq/L (ref 96–112)
Creatinine, Ser: 1.24 mg/dL — ABNORMAL HIGH (ref 0.40–1.20)
GFR: 42.37 mL/min — ABNORMAL LOW (ref >60.00)
Glucose, Bld: 92 mg/dL (ref 70–99)
Potassium: 4.4 meq/L (ref 3.5–5.1)
Sodium: 141 meq/L (ref 135–145)
Total Bilirubin: 0.5 mg/dL (ref 0.2–1.2)
Total Protein: 6.5 g/dL (ref 6.0–8.3)

## 2023-08-31 LAB — CBC WITH DIFFERENTIAL/PLATELET
Basophils Absolute: 0.1 K/uL (ref 0.0–0.1)
Basophils Relative: 1.7 % (ref 0.0–3.0)
Eosinophils Absolute: 0.4 K/uL (ref 0.0–0.7)
Eosinophils Relative: 8.4 % — ABNORMAL HIGH (ref 0.0–5.0)
HCT: 32.3 % — ABNORMAL LOW (ref 36.0–46.0)
Hemoglobin: 10.8 g/dL — ABNORMAL LOW (ref 12.0–15.0)
Lymphocytes Relative: 45.6 % (ref 12.0–46.0)
Lymphs Abs: 2.2 K/uL (ref 0.7–4.0)
MCHC: 33.6 g/dL (ref 30.0–36.0)
MCV: 93.1 fl (ref 78.0–100.0)
Monocytes Absolute: 0.5 K/uL (ref 0.1–1.0)
Monocytes Relative: 9.6 % (ref 3.0–12.0)
Neutro Abs: 1.6 K/uL (ref 1.4–7.7)
Neutrophils Relative %: 34.7 % — ABNORMAL LOW (ref 43.0–77.0)
Platelets: 244 K/uL (ref 150.0–400.0)
RBC: 3.47 Mil/uL — ABNORMAL LOW (ref 3.87–5.11)
RDW: 13.9 % (ref 11.5–15.5)
WBC: 4.7 K/uL (ref 4.0–10.5)

## 2023-09-02 ENCOUNTER — Encounter: Payer: Self-pay | Admitting: Internal Medicine

## 2023-09-02 ENCOUNTER — Ambulatory Visit: Payer: Self-pay | Admitting: Internal Medicine

## 2023-09-02 ENCOUNTER — Ambulatory Visit: Admitting: Internal Medicine

## 2023-09-02 VITALS — BP 133/72 | HR 72 | Temp 98.3°F | Ht 60.0 in | Wt 126.0 lb

## 2023-09-02 DIAGNOSIS — N183 Chronic kidney disease, stage 3 unspecified: Secondary | ICD-10-CM | POA: Diagnosis not present

## 2023-09-02 DIAGNOSIS — R1084 Generalized abdominal pain: Secondary | ICD-10-CM | POA: Diagnosis not present

## 2023-09-02 DIAGNOSIS — D126 Benign neoplasm of colon, unspecified: Secondary | ICD-10-CM

## 2023-09-02 DIAGNOSIS — J4521 Mild intermittent asthma with (acute) exacerbation: Secondary | ICD-10-CM | POA: Diagnosis not present

## 2023-09-02 DIAGNOSIS — E785 Hyperlipidemia, unspecified: Secondary | ICD-10-CM

## 2023-09-02 DIAGNOSIS — D649 Anemia, unspecified: Secondary | ICD-10-CM | POA: Diagnosis not present

## 2023-09-02 NOTE — Assessment & Plan Note (Signed)
F/u w/Dr Tenny Craw. On Lipitor 5 mg/d, baby ASA

## 2023-09-02 NOTE — Assessment & Plan Note (Signed)
 CMET q 3 mo Hydryte well

## 2023-09-02 NOTE — Assessment & Plan Note (Signed)
 Repeat colon in 3 years - May 2026

## 2023-09-02 NOTE — Assessment & Plan Note (Addendum)
 Monitor CBC Due to CRI Iron tests - nl Consider EPO if Hgb<10

## 2023-09-02 NOTE — Assessment & Plan Note (Signed)
 Continue with Arnuity Ellipta 

## 2023-09-02 NOTE — Assessment & Plan Note (Signed)
GI ref Dr Henrene Pastor

## 2023-09-02 NOTE — Progress Notes (Signed)
 Subjective:  Patient ID: [name], female    DOB: 1945/09/26  Age: 78 y.o. MRN: [id number]  CC: Medical Management of Chronic Issues (3 mnth f/u)   HPI [name] presents for abd pain - resolved F/u on HTN, asthma, CRI  Outpatient Medications Prior to Visit  Medication Sig Dispense Refill   aspirin EC 81 MG tablet Take 81 mg by mouth daily.      atorvastatin  (LIPITOR) 10 MG tablet TAKE 1 TABLET BY MOUTH FOUR TIMES A WEEK. 90 tablet 3   Calcium  Citrate (CITRACAL PO) Take 1 capsule by mouth daily.      cetirizine (ZYRTEC) 10 MG tablet Take 10 mg by mouth as needed for allergies.     Cholecalciferol (VITAMIN D3) 50 MCG (2000 UT) capsule Take 1 capsule (2,000 Units total) by mouth daily. 100 capsule 3   EPINEPHrine  0.3 mg/0.3 mL IJ SOAJ injection USE AS DIRECTED 2 each 0   estradiol (ESTRACE) 0.1 MG/GM vaginal cream Place vaginally.     Fluticasone  Furoate-Vilanterol (BREO ELLIPTA  IN) Inhale into the lungs.     fluticasone  furoate-vilanterol (BREO ELLIPTA ) 200-25 MCG/ACT AEPB Inhale 1 puff into the lungs daily. 180 each 3   fluticasone  furoate-vilanterol (BREO ELLIPTA ) 200-25 MCG/ACT AEPB Inhale 1 puff into the lungs daily. 30 each 3   ipratropium (ATROVENT  HFA) 17 MCG/ACT inhaler Inhale 2 puffs into the lungs every 4 (four) hours as needed for wheezing. 1 each 5   olmesartan  (BENICAR ) 40 MG tablet TAKE 1 TABLET BY MOUTH DAILY 90 tablet 3   omega-3 acid ethyl esters (LOVAZA ) 1 g capsule TAKE 2 CAPSULES BY MOUTH TWICE DAILY 120 capsule 11   ondansetron  (ZOFRAN ) 4 MG tablet Take 1 tablet (4 mg total) by mouth every 8 (eight) hours as needed for nausea or vomiting. 20 tablet 0   Pediatric Multiple Vit-C-FA (MULTIVITAMIN ANIMAL SHAPES, WITH CA/FA,) WITH C & FA CHEW Chew 1 tablet by mouth daily.     tretinoin (RETIN-A) 0.05 % cream      No facility-administered medications prior to visit.    ROS: Review of Systems  Constitutional:  Positive for fatigue. Negative for activity  change, appetite change, chills and unexpected weight change.  HENT:  Negative for congestion, mouth sores and sinus pressure.   Eyes:  Negative for visual disturbance.  Respiratory:  Negative for cough and chest tightness.   Gastrointestinal:  Negative for abdominal pain and nausea.  Genitourinary:  Negative for difficulty urinating, frequency and vaginal pain.  Musculoskeletal:  Negative for back pain and gait problem.  Skin:  Negative for pallor and rash.  Neurological:  Negative for dizziness, tremors, weakness, numbness and headaches.  Psychiatric/Behavioral:  Negative for confusion, sleep disturbance and suicidal ideas.     Objective:  BP 133/72   Pulse 72   Temp 98.3 F (36.8 C) (Oral)   Ht 5' (1.524 m)   Wt 126 lb (57.2 kg)   SpO2 97%   BMI 24.61 kg/m   BP Readings from Last 3 Encounters:  09/02/23 133/72  06/24/23 132/64  06/20/23 112/67    Wt Readings from Last 3 Encounters:  09/02/23 126 lb (57.2 kg)  06/24/23 125 lb (56.7 kg)  06/03/23 128 lb 8 oz (58.3 kg)    Physical Exam  Lab Results  Component Value Date   WBC 4.7 08/31/2023   HGB 10.8 (L) 08/31/2023   HCT 32.3 (L) 08/31/2023   PLT 244.0 08/31/2023   GLUCOSE 92 08/31/2023   CHOL  184 02/23/2023   TRIG 75.0 02/23/2023   HDL 81.70 02/23/2023   LDLDIRECT 126.2 10/06/2012   LDLCALC 87 02/23/2023   ALT 23 08/31/2023   AST 21 08/31/2023   NA 141 08/31/2023   K 4.4 08/31/2023   CL 107 08/31/2023   CREATININE 1.24 (H) 08/31/2023   BUN 30 (H) 08/31/2023   CO2 27 08/31/2023   TSH 3.207 06/20/2023   INR 0.95 07/20/2010   HGBA1C 6.4 02/23/2023    DG Abd 2 Views Result Date: 06/25/2023 CLINICAL DATA:  Abdominal pain.  Constipation. EXAM: ABDOMEN - 2 VIEW COMPARISON:  None Available. FINDINGS: Moderate stool throughout the colon. There is no bowel dilatation or evidence of obstruction. No free air or radiopaque calculi. Degenerative changes of the spine. No acute osseous pathology. IMPRESSION: Moderate  colonic stool burden. No bowel obstruction. Electronically Signed   By: Vanetta Chou M.D.   On: 06/25/2023 12:03    Assessment & Plan:   Problem List Items Addressed This Visit     Anemia, normocytic normochromic   Monitor CBC Due to CRI Iron tests - nl Consider EPO if Hgb<10      Relevant Orders   CBC with Differential/Platelet   Comprehensive metabolic panel with GFR   Iron, TIBC and Ferritin Panel   Asthma   Continue with Arnuity Ellipta       Colon polyps   Repeat colon in 3 years - May 2026      Relevant Orders   Ambulatory referral to Gastroenterology   CRI (chronic renal insufficiency), stage 3 (moderate) (HCC)   CMET q 3 mo Hydryte well       Relevant Orders   CBC with Differential/Platelet   Comprehensive metabolic panel with GFR   Iron, TIBC and Ferritin Panel   Dyslipidemia   F/u w/Dr Okey. On Lipitor 5 mg/d, baby ASA      Generalized abdominal pain - Primary   GI ref - Dr Abran      Relevant Orders   Ambulatory referral to Gastroenterology      No orders of the defined types were placed in this encounter.     Follow-up: Return in about 3 months (around 12/03/2023) for a follow-up visit.  Marolyn Noel, MD

## 2023-09-24 DIAGNOSIS — L814 Other melanin hyperpigmentation: Secondary | ICD-10-CM | POA: Diagnosis not present

## 2023-09-24 DIAGNOSIS — L821 Other seborrheic keratosis: Secondary | ICD-10-CM | POA: Diagnosis not present

## 2023-09-24 DIAGNOSIS — Z419 Encounter for procedure for purposes other than remedying health state, unspecified: Secondary | ICD-10-CM | POA: Diagnosis not present

## 2023-09-24 DIAGNOSIS — Z85828 Personal history of other malignant neoplasm of skin: Secondary | ICD-10-CM | POA: Diagnosis not present

## 2023-09-24 DIAGNOSIS — L738 Other specified follicular disorders: Secondary | ICD-10-CM | POA: Diagnosis not present

## 2023-09-24 DIAGNOSIS — D1801 Hemangioma of skin and subcutaneous tissue: Secondary | ICD-10-CM | POA: Diagnosis not present

## 2023-09-24 DIAGNOSIS — L720 Epidermal cyst: Secondary | ICD-10-CM | POA: Diagnosis not present

## 2023-10-15 ENCOUNTER — Other Ambulatory Visit: Payer: Self-pay | Admitting: Internal Medicine

## 2023-10-15 ENCOUNTER — Other Ambulatory Visit (HOSPITAL_BASED_OUTPATIENT_CLINIC_OR_DEPARTMENT_OTHER): Payer: Self-pay

## 2023-10-15 MED ORDER — FLUZONE HIGH-DOSE 0.5 ML IM SUSY
0.5000 mL | PREFILLED_SYRINGE | Freq: Once | INTRAMUSCULAR | 0 refills | Status: AC
  Filled 2023-10-15: qty 0.5, 1d supply, fill #0

## 2023-10-19 DIAGNOSIS — H401131 Primary open-angle glaucoma, bilateral, mild stage: Secondary | ICD-10-CM | POA: Diagnosis not present

## 2023-10-22 ENCOUNTER — Encounter: Payer: Self-pay | Admitting: Internal Medicine

## 2023-11-13 DIAGNOSIS — N1832 Chronic kidney disease, stage 3b: Secondary | ICD-10-CM | POA: Diagnosis not present

## 2023-11-18 DIAGNOSIS — I10 Essential (primary) hypertension: Secondary | ICD-10-CM | POA: Diagnosis not present

## 2023-11-18 DIAGNOSIS — N1832 Chronic kidney disease, stage 3b: Secondary | ICD-10-CM | POA: Diagnosis not present

## 2023-11-18 DIAGNOSIS — Q613 Polycystic kidney, unspecified: Secondary | ICD-10-CM | POA: Diagnosis not present

## 2023-11-19 ENCOUNTER — Encounter: Payer: Self-pay | Admitting: Internal Medicine

## 2023-11-23 ENCOUNTER — Ambulatory Visit: Admitting: Pulmonary Disease

## 2023-11-23 ENCOUNTER — Encounter: Payer: Self-pay | Admitting: Pulmonary Disease

## 2023-11-23 VITALS — BP 122/68 | HR 71 | Temp 98.5°F | Ht 60.0 in | Wt 129.6 lb

## 2023-11-23 DIAGNOSIS — J4540 Moderate persistent asthma, uncomplicated: Secondary | ICD-10-CM

## 2023-11-23 DIAGNOSIS — Z8616 Personal history of COVID-19: Secondary | ICD-10-CM | POA: Diagnosis not present

## 2023-11-23 DIAGNOSIS — J309 Allergic rhinitis, unspecified: Secondary | ICD-10-CM | POA: Diagnosis not present

## 2023-11-23 DIAGNOSIS — J45909 Unspecified asthma, uncomplicated: Secondary | ICD-10-CM

## 2023-11-23 MED ORDER — FLUTICASONE-SALMETEROL 250-50 MCG/ACT IN AEPB: 1.0000 | INHALATION_SPRAY | Freq: Two times a day (BID) | RESPIRATORY_TRACT | 6 refills | Status: DC

## 2023-11-23 NOTE — Progress Notes (Unsigned)
 Synopsis: Referred in by Plotnikov, Karlynn GAILS, MD   Subjective:   PATIENT ID: [name] GENDER: female DOB: 1946/01/05, MRN: [id number]  Chief Complaint  Patient presents with  . Asthma    Wheezing at night. No SOB or cough.  Breo- daily does not know if it helps because she still wheezing. Atrovent - PRN    HPI [name] is a pleasant 78 year old female patient with a past medical history of moderate persistent asthma presenting today to the pulmonary office to establish care.  She was diagnosed with asthma for years and has been on Arnuity Ellipta  for 3 years.  She uses Atrovent  on an as-needed basis.  Viral infections usually flares her asthma symptoms and causes acute bronchitis.  She does report hypersensitivity to strong scents and high humidity.  She was never hospitalized or required intubation for asthma.  She was recently in [place] in January and contracted COVID and has flared up her asthma symptoms, she required multiple rounds of systemic steroids as outpatient as well as azithromycin .  She did improve however she still has lingering cough.  Chest x-ray 01/2023 with no active cardiopulmonary disease.  Labs 02/16/2023: Total IgE 788 with multiple specific IgE sensitivities including cat dander, dog dander, others Relative eosinophils 5.9%, absolute 0.3 Family history -cousins in [place] with asthma  Pulmonary function testing 03/24/2023 reviewed by me shows grossly normal airflows with some evidence for hyperinflation on lung volumes, decreased diffusion capacity. Bronchodilator testing was not done.   Social history -never smoker, drinks alcohol occasionally, denies any illicit drug use.  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  She has 1 dog at home.   OV 06/03/2023 - [name] is here to follow up on her asthma. For a while she has been on Arnuity and most of her symptoms flare during a viral infection. Changed to [facility] in February but  then saw Dr. Shelah in March and was concerned she had paradoxical  upper airway irritation and was advised to continue on arnuity. But her symptoms resolved after spring and she continued with breo. Does not describe any issues currently. IS only concerned if she gets and infection in the future how severe her symptoms will be.  ROS All systems were reviewed and are negative except for the above. Objective:   There were no vitals filed for this visit.    on  RA BMI Readings from Last 3 Encounters:  09/02/23 24.61 kg/m  06/24/23 24.41 kg/m  06/03/23 25.10 kg/m   Wt Readings from Last 3 Encounters:  09/02/23 126 lb (57.2 kg)  06/24/23 125 lb (56.7 kg)  06/03/23 128 lb 8 oz (58.3 kg)    Physical Exam GEN: NAD, Healthy Appearing HEENT: Supple Neck, Reactive Pupils, EOMI  CVS: Normal S1, Normal S2, RRR, No murmurs or ES appreciated  Lungs: Poor expiratory effort..  Abdomen: Soft, non tender, non distended, + BS  Extremities: Warm and well perfused, No edema  Skin: No suspicious lesions appreciated  Psych: Normal Affect  Ancillary Information   CBC    Component Value Date/Time   WBC 4.7 08/31/2023 0913   RBC 3.47 (L) 08/31/2023 0913   HGB 10.8 (L) 08/31/2023 0913   HCT 32.3 (L) 08/31/2023 0913   PLT 244.0 08/31/2023 0913   MCV 93.1 08/31/2023 0913   MCH 31.2 06/20/2023 1917   MCHC 33.6 08/31/2023 0913   RDW 13.9 08/31/2023 0913   LYMPHSABS 2.2 08/31/2023 0913   MONOABS 0.5  08/31/2023 0913   EOSABS 0.4 08/31/2023 0913   BASOSABS 0.1 08/31/2023 0913   Labs and imaging were reviewed    Latest Ref Rng & Units 03/24/2023    1:11 PM  PFT Results  FVC-Pre L 2.93   FVC-Predicted Pre % 125   Pre FEV1/FVC % % 73   FEV1-Pre L 2.13   FEV1-Predicted Pre % 122   DLCO uncorrected ml/min/mmHg 11.67   DLCO UNC% % 70   DLVA Predicted % 64   TLC L 5.84   TLC % Predicted % 131   RV % Predicted % 137      Assessment & Plan:  [name] is a pleasant 78 year old female  patient with a past medical history of moderate persistent asthma presenting today to the pulmonary office to establish care.  # Moderate persistent asthma #Allergic rhinitis.  Flared by COVID-19 in January.  Currently with acute bronchitis.  I reassured her that this this will resolve.  She is already improving.  I will change her inhalers to include ICS/LABA.  Will phenotype her asthma and obtain pulmonary function test. FENO 20 indicating indeterminate for eosinophilic inflammation in the lungs.   []  c/w fluticasone -vilanterol [Breo Ellipta ] 200 1 puff daily. []  Continue Atrovent  2 puffs every 6 hours as needed.  She is allergic to albuterol . []  C/w Nasocort []  I think biologics Xolair vs dupixent will be in her future we'll decide on that on next visit.   RTC 6 months.   I spent 30 minutes caring for this patient today, including preparing to see the patient, obtaining a medical history , reviewing a separately obtained history, performing a medically appropriate examination and/or evaluation, counseling and educating the patient/family/caregiver, ordering medications, tests, or procedures, documenting clinical information in the electronic health record, and independently interpreting results (not separately reported/billed) and communicating results to the patient/family/caregiver  Darrin Barn, MD [facility] 11/23/2023 3:03 PM

## 2023-11-26 ENCOUNTER — Other Ambulatory Visit (INDEPENDENT_AMBULATORY_CARE_PROVIDER_SITE_OTHER)

## 2023-11-26 DIAGNOSIS — D649 Anemia, unspecified: Secondary | ICD-10-CM

## 2023-11-26 DIAGNOSIS — N2889 Other specified disorders of kidney and ureter: Secondary | ICD-10-CM | POA: Diagnosis not present

## 2023-11-26 LAB — CBC WITH DIFFERENTIAL/PLATELET
Basophils Absolute: 0.1 K/uL (ref 0.0–0.1)
Basophils Relative: 1.1 % (ref 0.0–3.0)
Eosinophils Absolute: 0.4 K/uL (ref 0.0–0.7)
Eosinophils Relative: 6.7 % — ABNORMAL HIGH (ref 0.0–5.0)
HCT: 33.7 % — ABNORMAL LOW (ref 36.0–46.0)
Hemoglobin: 11.3 g/dL — ABNORMAL LOW (ref 12.0–15.0)
Lymphocytes Relative: 50.8 % — ABNORMAL HIGH (ref 12.0–46.0)
Lymphs Abs: 2.8 K/uL (ref 0.7–4.0)
MCHC: 33.3 g/dL (ref 30.0–36.0)
MCV: 93.7 fl (ref 78.0–100.0)
Monocytes Absolute: 0.6 K/uL (ref 0.1–1.0)
Monocytes Relative: 11.2 % (ref 3.0–12.0)
Neutro Abs: 1.6 K/uL (ref 1.4–7.7)
Neutrophils Relative %: 30.2 % — ABNORMAL LOW (ref 43.0–77.0)
Platelets: 230 K/uL (ref 150.0–400.0)
RBC: 3.6 Mil/uL — ABNORMAL LOW (ref 3.87–5.11)
RDW: 13.2 % (ref 11.5–15.5)
WBC: 5.4 K/uL (ref 4.0–10.5)

## 2023-11-26 LAB — COMPREHENSIVE METABOLIC PANEL WITH GFR
ALT: 20 U/L (ref 0–35)
AST: 22 U/L (ref 0–37)
Albumin: 4.2 g/dL (ref 3.5–5.2)
Alkaline Phosphatase: 35 U/L — ABNORMAL LOW (ref 39–117)
BUN: 34 mg/dL — ABNORMAL HIGH (ref 6–23)
CO2: 26 meq/L (ref 19–32)
Calcium: 9.3 mg/dL (ref 8.4–10.5)
Chloride: 105 meq/L (ref 96–112)
Creatinine, Ser: 1.41 mg/dL — ABNORMAL HIGH (ref 0.40–1.20)
GFR: 36.26 mL/min — ABNORMAL LOW (ref >60.00)
Glucose, Bld: 96 mg/dL (ref 70–99)
Potassium: 4 meq/L (ref 3.5–5.1)
Sodium: 140 meq/L (ref 135–145)
Total Bilirubin: 0.5 mg/dL (ref 0.2–1.2)
Total Protein: 6.9 g/dL (ref 6.0–8.3)

## 2023-11-27 LAB — IRON,TIBC AND FERRITIN PANEL
%SAT: 29 % (ref 16–45)
Ferritin: 32 ng/mL (ref 16–288)
Iron: 91 ug/dL (ref 45–160)
TIBC: 318 ug/dL (ref 250–450)

## 2023-12-03 ENCOUNTER — Ambulatory Visit: Admitting: Internal Medicine

## 2023-12-03 ENCOUNTER — Encounter: Payer: Self-pay | Admitting: Internal Medicine

## 2023-12-03 VITALS — BP 124/72 | HR 62 | Temp 98.0°F | Ht 60.0 in | Wt 130.4 lb

## 2023-12-03 DIAGNOSIS — R202 Paresthesia of skin: Secondary | ICD-10-CM

## 2023-12-03 DIAGNOSIS — R5383 Other fatigue: Secondary | ICD-10-CM | POA: Diagnosis not present

## 2023-12-03 DIAGNOSIS — J4521 Mild intermittent asthma with (acute) exacerbation: Secondary | ICD-10-CM | POA: Diagnosis not present

## 2023-12-03 DIAGNOSIS — J4540 Moderate persistent asthma, uncomplicated: Secondary | ICD-10-CM | POA: Diagnosis not present

## 2023-12-03 MED ORDER — MONTELUKAST SODIUM 10 MG PO TABS
10.0000 mg | ORAL_TABLET | Freq: Every day | ORAL | 11 refills | Status: AC
Start: 2023-12-03 — End: ?

## 2023-12-03 MED ORDER — PREDNISONE 10 MG PO TABS: ORAL_TABLET | ORAL | 1 refills | Status: DC

## 2023-12-03 NOTE — Progress Notes (Signed)
 Subjective:  Patient ID: [name], female    DOB: 10-Jan-1946  Age: 78 y.o. MRN: [id number]  CC: Medical Management of Chronic Issues (3 Month follow up)   HPI [name] presents for fatigue, anemia, asthma, CRI  Outpatient Medications Prior to Visit  Medication Sig Dispense Refill   aspirin EC 81 MG tablet Take 81 mg by mouth daily.      atorvastatin  (LIPITOR) 10 MG tablet TAKE 1 TABLET BY MOUTH 4 TIMES A WEEK 90 tablet 3   Calcium  Citrate (CITRACAL PO) Take 1 capsule by mouth daily.      cetirizine (ZYRTEC) 10 MG tablet Take 10 mg by mouth as needed for allergies.     Cholecalciferol (VITAMIN D3) 50 MCG (2000 UT) capsule Take 1 capsule (2,000 Units total) by mouth daily. 100 capsule 3   EPINEPHrine  0.3 mg/0.3 mL IJ SOAJ injection USE AS DIRECTED 2 each 0   estradiol (ESTRACE) 0.1 MG/GM vaginal cream Place vaginally.     fluticasone -salmeterol (ADVAIR DISKUS) 250-50 MCG/ACT AEPB Inhale 1 puff into the lungs in the morning and at bedtime. 1 each 6   ipratropium (ATROVENT  HFA) 17 MCG/ACT inhaler Inhale 2 puffs into the lungs every 4 (four) hours as needed for wheezing. 1 each 5   olmesartan  (BENICAR ) 40 MG tablet TAKE 1 TABLET BY MOUTH DAILY 90 tablet 3   omega-3 acid ethyl esters (LOVAZA ) 1 g capsule TAKE 2 CAPSULES BY MOUTH TWICE DAILY 120 capsule 11   ondansetron  (ZOFRAN ) 4 MG tablet Take 1 tablet (4 mg total) by mouth every 8 (eight) hours as needed for nausea or vomiting. 20 tablet 0   Pediatric Multiple Vit-C-FA (MULTIVITAMIN ANIMAL SHAPES, WITH CA/FA,) WITH C & FA CHEW Chew 1 tablet by mouth daily.     tretinoin (RETIN-A) 0.05 % cream      No facility-administered medications prior to visit.    ROS: Review of Systems  Constitutional:  Negative for activity change, appetite change, chills, fatigue and unexpected weight change.  HENT:  Negative for congestion, mouth sores and sinus pressure.   Eyes:  Negative for visual disturbance.  Respiratory:  Positive for  wheezing. Negative for cough and chest tightness.   Gastrointestinal:  Negative for abdominal pain and nausea.  Genitourinary:  Negative for difficulty urinating, frequency and vaginal pain.  Musculoskeletal:  Negative for back pain and gait problem.  Skin:  Negative for pallor and rash.  Neurological:  Negative for dizziness, tremors, weakness, numbness and headaches.  Psychiatric/Behavioral:  Negative for confusion, sleep disturbance and suicidal ideas.     Objective:  BP 124/72   Pulse 62   Temp 98 F (36.7 C)   Ht 5' (1.524 m)   Wt 130 lb 6.4 oz (59.1 kg)   SpO2 99%   BMI 25.47 kg/m   BP Readings from Last 3 Encounters:  12/03/23 124/72  11/23/23 122/68  09/02/23 133/72    Wt Readings from Last 3 Encounters:  12/03/23 130 lb 6.4 oz (59.1 kg)  11/23/23 129 lb 9.6 oz (58.8 kg)  09/02/23 126 lb (57.2 kg)    Physical Exam Constitutional:      General: She is not in acute distress.    Appearance: Normal appearance. She is well-developed.  HENT:     Head: Normocephalic.     Right Ear: External ear normal.     Left Ear: External ear normal.     Nose: Nose normal.  Eyes:     General:  Right eye: No discharge.        Left eye: No discharge.     Conjunctiva/sclera: Conjunctivae normal.     Pupils: Pupils are equal, round, and reactive to light.  Neck:     Thyroid : No thyromegaly.     Vascular: No JVD.     Trachea: No tracheal deviation.  Cardiovascular:     Rate and Rhythm: Normal rate and regular rhythm.     Heart sounds: Normal heart sounds.  Pulmonary:     Effort: No respiratory distress.     Breath sounds: No stridor. No wheezing.  Abdominal:     General: Bowel sounds are normal. There is no distension.     Palpations: Abdomen is soft. There is no mass.     Tenderness: There is no abdominal tenderness. There is no guarding or rebound.  Musculoskeletal:        General: No tenderness.     Cervical back: Normal range of motion and neck supple. No  rigidity.  Lymphadenopathy:     Cervical: No cervical adenopathy.  Skin:    Findings: No erythema or rash.  Neurological:     Cranial Nerves: No cranial nerve deficit.     Motor: No abnormal muscle tone.     Coordination: Coordination normal.     Deep Tendon Reflexes: Reflexes normal.  Psychiatric:        Behavior: Behavior normal.        Thought Content: Thought content normal.        Judgment: Judgment normal.     Lab Results  Component Value Date   WBC 5.4 11/26/2023   HGB 11.3 (L) 11/26/2023   HCT 33.7 (L) 11/26/2023   PLT 230.0 11/26/2023   GLUCOSE 96 11/26/2023   CHOL 184 02/23/2023   TRIG 75.0 02/23/2023   HDL 81.70 02/23/2023   LDLDIRECT 126.2 10/06/2012   LDLCALC 87 02/23/2023   ALT 20 11/26/2023   AST 22 11/26/2023   NA 140 11/26/2023   K 4.0 11/26/2023   CL 105 11/26/2023   CREATININE 1.41 (H) 11/26/2023   BUN 34 (H) 11/26/2023   CO2 26 11/26/2023   TSH 3.207 06/20/2023   INR 0.95 07/20/2010   HGBA1C 6.4 02/23/2023    DG Abd 2 Views Result Date: 06/25/2023 CLINICAL DATA:  Abdominal pain.  Constipation. EXAM: ABDOMEN - 2 VIEW COMPARISON:  None Available. FINDINGS: Moderate stool throughout the colon. There is no bowel dilatation or evidence of obstruction. No free air or radiopaque calculi. Degenerative changes of the spine. No acute osseous pathology. IMPRESSION: Moderate colonic stool burden. No bowel obstruction. Electronically Signed   By: Vanetta Chou M.D.   On: 06/25/2023 12:03    Assessment & Plan:   Problem List Items Addressed This Visit     Asthma - Primary   Will try to add Montelukast 10 mg/d      Relevant Medications   predniSONE  (DELTASONE ) 10 MG tablet   montelukast (SINGULAIR) 10 MG tablet   Other Relevant Orders   CBC with Differential/Platelet   Asthmatic bronchitis   Start Singulair      Relevant Medications   predniSONE  (DELTASONE ) 10 MG tablet   montelukast (SINGULAIR) 10 MG tablet   Other Relevant Orders   TSH    T4, free   Vitamin B12   CBC with Differential/Platelet   Fatigue   Check Vit B12, FT4      Relevant Orders   TSH   T4, free   Vitamin B12  CBC with Differential/Platelet   Other Visit Diagnoses       Paresthesia       Relevant Orders   Vitamin B12         Meds ordered this encounter  Medications   predniSONE  (DELTASONE ) 10 MG tablet    Sig: Prednisone  10 mg: take 4 tabs a day x 3 days; then 3 tabs a day x 4 days; then 2 tabs a day x 4 days, then 1 tab a day x 6 days, then stop. Take pc.    Dispense:  38 tablet    Refill:  1   montelukast (SINGULAIR) 10 MG tablet    Sig: Take 1 tablet (10 mg total) by mouth at bedtime.    Dispense:  30 tablet    Refill:  11      Follow-up: No follow-ups on file.  Marolyn Noel, MD

## 2023-12-03 NOTE — Assessment & Plan Note (Signed)
 Check Vit B12, FT4

## 2023-12-03 NOTE — Assessment & Plan Note (Signed)
 Will try to add Montelukast  10 mg/d

## 2023-12-03 NOTE — Assessment & Plan Note (Signed)
Start Singulair °

## 2023-12-04 ENCOUNTER — Encounter: Payer: Self-pay | Admitting: Pulmonary Disease

## 2023-12-15 ENCOUNTER — Encounter: Payer: Self-pay | Admitting: Internal Medicine

## 2023-12-17 ENCOUNTER — Other Ambulatory Visit: Payer: Self-pay

## 2023-12-17 DIAGNOSIS — R002 Palpitations: Secondary | ICD-10-CM

## 2023-12-17 DIAGNOSIS — I10 Essential (primary) hypertension: Secondary | ICD-10-CM

## 2023-12-17 DIAGNOSIS — Z79899 Other long term (current) drug therapy: Secondary | ICD-10-CM

## 2023-12-17 DIAGNOSIS — E785 Hyperlipidemia, unspecified: Secondary | ICD-10-CM

## 2023-12-17 DIAGNOSIS — I519 Heart disease, unspecified: Secondary | ICD-10-CM

## 2023-12-24 ENCOUNTER — Encounter: Payer: Self-pay | Admitting: Pulmonary Disease

## 2023-12-25 ENCOUNTER — Encounter: Payer: Self-pay | Admitting: Internal Medicine

## 2023-12-25 MED ORDER — ARNUITY ELLIPTA 100 MCG/ACT IN AEPB: 1.0000 | INHALATION_SPRAY | Freq: Every day | RESPIRATORY_TRACT | 12 refills | Status: DC
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
## 2023-12-29 ENCOUNTER — Ambulatory Visit: Admitting: Internal Medicine

## 2023-12-30 ENCOUNTER — Other Ambulatory Visit: Payer: Self-pay | Admitting: Internal Medicine

## 2023-12-30 ENCOUNTER — Ambulatory Visit: Payer: Self-pay

## 2023-12-30 MED ORDER — CYCLOBENZAPRINE HCL 10 MG PO TABS: 10.0000 mg | ORAL_TABLET | Freq: Two times a day (BID) | ORAL | 3 refills | Status: AC | PRN

## 2023-12-30 NOTE — Telephone Encounter (Signed)
 FYI Only or Action Required?: FYI only for provider: appointment scheduled on 12/31/23. UC advised today, refused. No sooner appts available.  Patient is followed in Pulmonology for Asthma, last seen on 11/23/2023 by Malka Domino, MD.  Called Nurse Triage reporting Sore Throat and Cough.  Symptoms began several days ago.  Interventions attempted: Rescue inhaler.  Symptoms are: gradually worsening.  Triage Disposition: See HCP Within 4 Hours (Or PCP Triage)  Patient/caregiver understands and will follow disposition?: Yes  Reason for Disposition  [1] MILD difficulty breathing (e.g., minimal/no SOB at rest, SOB with walking, pulse < 100) AND [2] still present when not coughing  Answer Assessment - Initial Assessment Questions Patient states that she started to have sore throat and coughing on Saturday, but it has gotten worse. She mentions that she usually feels this way when she has a respiratory infection. She reports wheezing and mild SOB that she feels is getting bad. She states she is using her rescue inhaler 4x/day and her peak flow is 250. She requests to see her PCP, no appts available. First available with office is tomorrow 12/18-advised UC but patient declines stating she would rather see the doctor tomorrow. Advised patient to go to [facility] or ED if symptoms worsen.   1. ONSET: When did the cough begin?      Saturday  2. SEVERITY: How bad is the cough today?      Mild  3. SPUTUM: Describe the color of your sputum (e.g., none, dry cough; clear, white, yellow, green)     Unknown  4. HEMOPTYSIS: Are you coughing up any blood? If Yes, ask: How much? (e.g., flecks, streaks, tablespoons, etc.)     No  5. DIFFICULTY BREATHING: Are you having difficulty breathing? If Yes, ask: How bad is it? (e.g., mild, moderate, severe)      Yes, mild but feels it's worsening  6. FEVER: Do you have a fever? If Yes, ask: What is your temperature, how was it measured, and  when did it start?     No  7. CARDIAC HISTORY: Do you have any history of heart disease? (e.g., heart attack, congestive heart failure)      Diastolic Dysfunction-left ventricle  8. LUNG HISTORY: Do you have any history of lung disease?  (e.g., pulmonary embolus, asthma, emphysema)     Asthma  9. PE RISK FACTORS: Do you have a history of blood clots? (or: recent major surgery, recent prolonged travel, bedridden)     No  10. OTHER SYMPTOMS: Do you have any other symptoms? (e.g., runny nose, wheezing, chest pain)       Wheezing, sore throat, chest pain with cough  11. PREGNANCY: Is there any chance you are pregnant? When was your last menstrual period?       NA  12. TRAVEL: Have you traveled out of the country in the last month? (e.g., travel history, exposures)       Unknown  Protocols used: Cough - Acute Productive-A-AH  Copied from CRM #[id number]. Topic: Clinical - Red Word Triage >> Dec 30, 2023  3:02 PM Rozanna MATSU wrote: Red Word that prompted transfer to Nurse Triage: wheezing, oxygen level dropped, coughing with mucus

## 2023-12-30 NOTE — Telephone Encounter (Signed)
 Noted, NFN

## 2023-12-31 ENCOUNTER — Ambulatory Visit: Admitting: Emergency Medicine

## 2023-12-31 ENCOUNTER — Encounter: Payer: Self-pay | Admitting: Emergency Medicine

## 2023-12-31 VITALS — BP 130/60 | HR 65 | Temp 98.7°F | Wt 126.6 lb

## 2023-12-31 DIAGNOSIS — J45909 Unspecified asthma, uncomplicated: Secondary | ICD-10-CM | POA: Diagnosis not present

## 2023-12-31 DIAGNOSIS — J4521 Mild intermittent asthma with (acute) exacerbation: Secondary | ICD-10-CM

## 2023-12-31 MED ORDER — AZITHROMYCIN 250 MG PO TABS: 250.0000 mg | ORAL_TABLET | Freq: Every day | ORAL | 0 refills | Status: AC

## 2023-12-31 MED ORDER — PREDNISONE 10 MG PO TABS: ORAL_TABLET | ORAL | 0 refills | Status: AC
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
## 2023-12-31 NOTE — Assessment & Plan Note (Signed)
 Acute exacerbation of her asthma +/-coexisting upper airway irritation syndrome.  Will treat her for a flare.  She seems to be tolerating the Advair fairly well although she does have some upper airway irritation associated with it.  She might do better on a nonpowdered inhaler going forward.  Please continue your Advair as you have been taking it for now.  Rinse and gargle after using.  You can discuss with Dr Malka about possibly changing her maintenance inhaler at some point going forward. Keep albuterol  available use 2 puffs if needed for shortness of breath, chest tightness, wheezing. Please take prednisone  as directed until completely gone Please take azithromycin  as directed until completely gone Follow up in about 6 weeks so we can assess your status and improvement

## 2023-12-31 NOTE — Patient Instructions (Signed)
 Please continue your Advair as you have been taking it for now.  Rinse and gargle after using.  You can discuss with Dr Malka about possibly changing her maintenance inhaler at some point going forward. Keep albuterol  available use 2 puffs if needed for shortness of breath, chest tightness, wheezing. Please take prednisone  as directed until completely gone Please take azithromycin  as directed until completely gone Follow up in about 6 weeks so we can assess your status and improvement

## 2024-01-26 ENCOUNTER — Ambulatory Visit: Admitting: Internal Medicine

## 2024-01-26 ENCOUNTER — Encounter: Payer: Self-pay | Admitting: Internal Medicine

## 2024-01-26 VITALS — BP 128/62 | HR 88 | Ht 61.0 in | Wt 126.1 lb

## 2024-01-26 DIAGNOSIS — K5900 Constipation, unspecified: Secondary | ICD-10-CM

## 2024-01-26 DIAGNOSIS — Z860101 Personal history of adenomatous and serrated colon polyps: Secondary | ICD-10-CM | POA: Diagnosis not present

## 2024-01-26 DIAGNOSIS — R1013 Epigastric pain: Secondary | ICD-10-CM

## 2024-01-26 DIAGNOSIS — Z860100 Personal history of colon polyps, unspecified: Secondary | ICD-10-CM

## 2024-01-26 DIAGNOSIS — K5901 Slow transit constipation: Secondary | ICD-10-CM
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
## 2024-01-26 NOTE — Patient Instructions (Signed)
 Please follow up as needed.  _______________________________________________________  If your blood pressure at your visit was 140/90 or greater, please contact your primary care physician to follow up on this.  _______________________________________________________  If you are age 77 or older, your body mass index should be between 23-30. Your Body mass index is 23.83 kg/m. If this is out of the aforementioned range listed, please consider follow up with your Primary Care Provider.  If you are age 32 or younger, your body mass index should be between 19-25. Your Body mass index is 23.83 kg/m. If this is out of the aformentioned range listed, please consider follow up with your Primary Care Provider.   ________________________________________________________  The [facility] providers would like to encourage you to use MYCHART to communicate with providers for non-urgent requests or questions.  Due to long hold times on the telephone, sending your provider a message by [facility] may be a faster and more efficient way to get a response.  Please allow 48 business hours for a response.  Please remember that this is for non-urgent requests.  _______________________________________________________  [facility] is using a team-based approach to care.  Your team is made up of your doctor and two to three APPS. Our APPS (Nurse Practitioners and Physician Assistants) work with your physician to ensure care continuity for you. They are fully qualified to address your health concerns and develop a treatment plan. They communicate directly with your gastroenterologist to care for you. Seeing the Advanced Practice Practitioners on your physician's team can help you by facilitating care more promptly, often allowing for earlier appointments, access to diagnostic testing, procedures, and other specialty referrals.

## 2024-01-29 ENCOUNTER — Encounter: Payer: Self-pay | Admitting: Pulmonary Disease

## 2024-01-29 MED ORDER — ARNUITY ELLIPTA 100 MCG/ACT IN AEPB: 1.0000 | INHALATION_SPRAY | Freq: Every day | RESPIRATORY_TRACT | 12 refills | Status: AC

## 2024-01-29 NOTE — Addendum Note (Signed)
 Addended by: VICCI EVALENE DEL on: 01/29/2024 12:54 PM   Modules accepted: Orders

## 2024-02-05 ENCOUNTER — Ambulatory Visit (HOSPITAL_COMMUNITY)
Admission: EM | Admit: 2024-02-05 | Discharge: 2024-02-05 | Disposition: A | Discharge status: Home / Self Care | Attending: Emergency Medicine | Admitting: Emergency Medicine

## 2024-02-05 ENCOUNTER — Encounter (HOSPITAL_COMMUNITY): Payer: Self-pay | Admitting: Emergency Medicine

## 2024-02-05 DIAGNOSIS — H5712 Ocular pain, left eye: Secondary | ICD-10-CM

## 2024-02-05 MED ORDER — ERYTHROMYCIN 5 MG/GM OP OINT: TOPICAL_OINTMENT | OPHTHALMIC | 0 refills | Status: AC

## 2024-02-05 NOTE — ED Triage Notes (Signed)
 Patient present with left eye swelling and feels like something is in her eye x 1 day. Patient vision is blurry. Patient denies any pain.  Patient used refresh eye drops however it did not help.
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
## 2024-02-05 NOTE — Discharge Instructions (Signed)
 There was no evidence of corneal abrasion on your exam Use the erythromycin ointment 4 times daily for the next 5 days to the left eye You can use warm or cool compresses to help with swelling and Tylenol as needed for any pain  Symptoms should improve over the next few days.  If no improvement or any changes seek follow-up care with an eye doctor

## 2024-02-18 ENCOUNTER — Encounter: Payer: Self-pay | Admitting: Pulmonary Disease

## 2024-02-18 ENCOUNTER — Encounter: Payer: Self-pay | Admitting: Internal Medicine

## 2024-02-18 NOTE — Telephone Encounter (Signed)
 Please do prior authorization, if possible. Thank you!

## 2024-02-25 ENCOUNTER — Ambulatory Visit: Payer: Medicare PPO

## 2024-03-07 ENCOUNTER — Ambulatory Visit: Admitting: Internal Medicine

## 2024-03-28 ENCOUNTER — Ambulatory Visit: Admitting: Internal Medicine

## 2024-04-04 ENCOUNTER — Ambulatory Visit: Admitting: Pulmonary Disease

## 2024-07-25 ENCOUNTER — Ambulatory Visit

## 2024-08-08 ENCOUNTER — Ambulatory Visit
